# Patient Record
Sex: Female | Born: 1980 | Race: White | Hispanic: No | Smoking: Never smoker
Health system: Southern US, Community
[De-identification: ages and names within clinical notes are randomized; demographics above are authoritative.]

## PROBLEM LIST (undated history)

## (undated) ENCOUNTER — Inpatient Hospital Stay (HOSPITAL_COMMUNITY): Payer: Self-pay

## (undated) DIAGNOSIS — G479 Sleep disorder, unspecified: Secondary | ICD-10-CM

## (undated) DIAGNOSIS — J45909 Unspecified asthma, uncomplicated: Secondary | ICD-10-CM

## (undated) DIAGNOSIS — Q761 Klippel-Feil syndrome: Secondary | ICD-10-CM

## (undated) DIAGNOSIS — N920 Excessive and frequent menstruation with regular cycle: Secondary | ICD-10-CM

## (undated) DIAGNOSIS — D219 Benign neoplasm of connective and other soft tissue, unspecified: Secondary | ICD-10-CM

## (undated) DIAGNOSIS — I10 Essential (primary) hypertension: Secondary | ICD-10-CM

## (undated) DIAGNOSIS — F419 Anxiety disorder, unspecified: Secondary | ICD-10-CM

## (undated) DIAGNOSIS — R112 Nausea with vomiting, unspecified: Secondary | ICD-10-CM

## (undated) DIAGNOSIS — Z9889 Other specified postprocedural states: Secondary | ICD-10-CM

## (undated) DIAGNOSIS — Z3490 Encounter for supervision of normal pregnancy, unspecified, unspecified trimester: Secondary | ICD-10-CM

## (undated) DIAGNOSIS — O09529 Supervision of elderly multigravida, unspecified trimester: Secondary | ICD-10-CM

## (undated) DIAGNOSIS — E039 Hypothyroidism, unspecified: Secondary | ICD-10-CM

## (undated) DIAGNOSIS — Z87442 Personal history of urinary calculi: Secondary | ICD-10-CM

## (undated) HISTORY — DX: Essential (primary) hypertension: I10

## (undated) HISTORY — DX: Excessive and frequent menstruation with regular cycle: N92.0

## (undated) HISTORY — DX: Anxiety disorder, unspecified: F41.9

## (undated) HISTORY — DX: Supervision of elderly multigravida, unspecified trimester: O09.529

## (undated) HISTORY — DX: Encounter for supervision of normal pregnancy, unspecified, unspecified trimester: Z34.90

## (undated) HISTORY — DX: Hypothyroidism, unspecified: E03.9

## (undated) HISTORY — DX: Klippel-Feil syndrome: Q76.1

## (undated) HISTORY — DX: Sleep disorder, unspecified: G47.9

## (undated) HISTORY — DX: Benign neoplasm of connective and other soft tissue, unspecified: D21.9

## (undated) HISTORY — DX: Unspecified asthma, uncomplicated: J45.909

## (undated) HISTORY — PX: CHOLECYSTECTOMY: SHX55

## (undated) HISTORY — PX: LITHOTRIPSY: SUR834

## (undated) HISTORY — PX: DILATION AND CURETTAGE OF UTERUS: SHX78

---

## 2002-07-07 ENCOUNTER — Encounter: Payer: Self-pay | Admitting: Emergency Medicine

## 2002-07-07 ENCOUNTER — Emergency Department (HOSPITAL_COMMUNITY): Admission: EM | Admit: 2002-07-07 | Discharge: 2002-07-07 | Payer: Self-pay | Admitting: Emergency Medicine

## 2003-03-01 ENCOUNTER — Other Ambulatory Visit: Admission: RE | Admit: 2003-03-01 | Discharge: 2003-03-01 | Payer: Self-pay | Admitting: Obstetrics and Gynecology

## 2003-05-12 ENCOUNTER — Emergency Department (HOSPITAL_COMMUNITY): Admission: EM | Admit: 2003-05-12 | Discharge: 2003-05-12 | Payer: Self-pay | Admitting: Emergency Medicine

## 2003-07-26 ENCOUNTER — Emergency Department (HOSPITAL_COMMUNITY): Admission: EM | Admit: 2003-07-26 | Discharge: 2003-07-26 | Payer: Self-pay | Admitting: Emergency Medicine

## 2004-10-23 ENCOUNTER — Encounter: Admission: RE | Admit: 2004-10-23 | Discharge: 2004-10-23 | Payer: Self-pay | Admitting: Family Medicine

## 2005-07-25 ENCOUNTER — Emergency Department (HOSPITAL_COMMUNITY): Admission: EM | Admit: 2005-07-25 | Discharge: 2005-07-25 | Payer: Self-pay | Admitting: Emergency Medicine

## 2005-09-16 ENCOUNTER — Other Ambulatory Visit: Admission: RE | Admit: 2005-09-16 | Discharge: 2005-09-16 | Payer: Self-pay | Admitting: Obstetrics and Gynecology

## 2006-05-25 ENCOUNTER — Emergency Department (HOSPITAL_COMMUNITY): Admission: EM | Admit: 2006-05-25 | Discharge: 2006-05-26 | Payer: Self-pay | Admitting: Emergency Medicine

## 2006-05-27 ENCOUNTER — Ambulatory Visit (HOSPITAL_COMMUNITY): Admission: RE | Admit: 2006-05-27 | Discharge: 2006-05-27 | Payer: Self-pay | Admitting: Family Medicine

## 2006-10-10 ENCOUNTER — Ambulatory Visit (HOSPITAL_COMMUNITY): Admission: RE | Admit: 2006-10-10 | Discharge: 2006-10-10 | Payer: Self-pay | Admitting: Family Medicine

## 2006-10-14 ENCOUNTER — Encounter (HOSPITAL_COMMUNITY): Admission: RE | Admit: 2006-10-14 | Discharge: 2006-11-13 | Payer: Self-pay | Admitting: Family Medicine

## 2006-10-17 ENCOUNTER — Ambulatory Visit (HOSPITAL_COMMUNITY): Admission: RE | Admit: 2006-10-17 | Discharge: 2006-10-17 | Payer: Self-pay | Admitting: General Surgery

## 2006-10-17 ENCOUNTER — Encounter (INDEPENDENT_AMBULATORY_CARE_PROVIDER_SITE_OTHER): Payer: Self-pay | Admitting: Specialist

## 2007-10-12 ENCOUNTER — Ambulatory Visit (HOSPITAL_COMMUNITY): Admission: RE | Admit: 2007-10-12 | Discharge: 2007-10-12 | Payer: Self-pay | Admitting: Obstetrics and Gynecology

## 2007-11-13 ENCOUNTER — Ambulatory Visit (HOSPITAL_COMMUNITY): Admission: RE | Admit: 2007-11-13 | Discharge: 2007-11-13 | Payer: Self-pay | Admitting: Family Medicine

## 2007-12-30 ENCOUNTER — Other Ambulatory Visit: Admission: RE | Admit: 2007-12-30 | Discharge: 2007-12-30 | Payer: Self-pay | Admitting: Obstetrics and Gynecology

## 2007-12-31 ENCOUNTER — Ambulatory Visit (HOSPITAL_COMMUNITY): Admission: RE | Admit: 2007-12-31 | Discharge: 2007-12-31 | Payer: Self-pay | Admitting: Obstetrics and Gynecology

## 2007-12-31 ENCOUNTER — Encounter: Payer: Self-pay | Admitting: Obstetrics and Gynecology

## 2008-09-01 ENCOUNTER — Ambulatory Visit (HOSPITAL_COMMUNITY): Admission: RE | Admit: 2008-09-01 | Discharge: 2008-09-01 | Payer: Self-pay | Admitting: Family Medicine

## 2009-05-15 ENCOUNTER — Other Ambulatory Visit: Admission: RE | Admit: 2009-05-15 | Discharge: 2009-05-15 | Payer: Self-pay | Admitting: Obstetrics and Gynecology

## 2009-08-19 ENCOUNTER — Inpatient Hospital Stay (HOSPITAL_COMMUNITY): Admission: AD | Admit: 2009-08-19 | Discharge: 2009-08-19 | Payer: Self-pay | Admitting: Obstetrics and Gynecology

## 2009-08-19 ENCOUNTER — Ambulatory Visit: Payer: Self-pay | Admitting: Advanced Practice Midwife

## 2009-11-05 ENCOUNTER — Ambulatory Visit: Payer: Self-pay | Admitting: Obstetrics and Gynecology

## 2009-11-05 ENCOUNTER — Inpatient Hospital Stay (HOSPITAL_COMMUNITY): Admission: AD | Admit: 2009-11-05 | Discharge: 2009-11-05 | Payer: Self-pay | Admitting: Obstetrics & Gynecology

## 2009-11-25 ENCOUNTER — Inpatient Hospital Stay (HOSPITAL_COMMUNITY): Admission: AD | Admit: 2009-11-25 | Discharge: 2009-11-27 | Payer: Self-pay | Admitting: Obstetrics & Gynecology

## 2009-11-25 ENCOUNTER — Ambulatory Visit: Payer: Self-pay | Admitting: Advanced Practice Midwife

## 2010-05-14 ENCOUNTER — Ambulatory Visit (HOSPITAL_COMMUNITY): Admission: RE | Admit: 2010-05-14 | Discharge: 2010-05-14 | Payer: Self-pay | Admitting: General Surgery

## 2010-07-08 ENCOUNTER — Encounter: Payer: Self-pay | Admitting: Neurosurgery

## 2010-09-03 LAB — URINALYSIS, DIPSTICK ONLY
Bilirubin Urine: NEGATIVE
Glucose, UA: NEGATIVE mg/dL
Ketones, ur: 40 mg/dL — AB
Leukocytes, UA: NEGATIVE
Nitrite: NEGATIVE
Protein, ur: NEGATIVE mg/dL
Specific Gravity, Urine: 1.025 (ref 1.005–1.030)
Urobilinogen, UA: 0.2 mg/dL (ref 0.0–1.0)
pH: 6.5 (ref 5.0–8.0)

## 2010-09-03 LAB — COMPREHENSIVE METABOLIC PANEL
ALT: 14 U/L (ref 0–35)
AST: 22 U/L (ref 0–37)
Albumin: 2.9 g/dL — ABNORMAL LOW (ref 3.5–5.2)
Alkaline Phosphatase: 135 U/L — ABNORMAL HIGH (ref 39–117)
BUN: 5 mg/dL — ABNORMAL LOW (ref 6–23)
CO2: 18 mEq/L — ABNORMAL LOW (ref 19–32)
Calcium: 9 mg/dL (ref 8.4–10.5)
Chloride: 107 mEq/L (ref 96–112)
Creatinine, Ser: 0.63 mg/dL (ref 0.4–1.2)
GFR calc Af Amer: >60 mL/min (ref >60)
GFR calc non Af Amer: >60 mL/min (ref >60)
Glucose, Bld: 101 mg/dL — ABNORMAL HIGH (ref 70–99)
Potassium: 3 mEq/L — ABNORMAL LOW (ref 3.5–5.1)
Sodium: 137 mEq/L (ref 135–145)
Total Bilirubin: 0.3 mg/dL (ref 0.3–1.2)
Total Protein: 5.8 g/dL — ABNORMAL LOW (ref 6.0–8.3)

## 2010-09-03 LAB — CBC
HCT: 39.1 % (ref 36.0–46.0)
Hemoglobin: 13.6 g/dL (ref 12.0–15.0)
MCHC: 34.7 g/dL (ref 30.0–36.0)
MCV: 91.4 fL (ref 78.0–100.0)
Platelets: 269 10*3/uL (ref 150–400)
RBC: 4.28 MIL/uL (ref 3.87–5.11)
RDW: 13.3 % (ref 11.5–15.5)
WBC: 10.4 10*3/uL (ref 4.0–10.5)

## 2010-09-03 LAB — RPR: RPR Ser Ql: NONREACTIVE

## 2010-09-03 LAB — URIC ACID: Uric Acid, Serum: 4.8 mg/dL (ref 2.4–7.0)

## 2010-09-10 LAB — URINALYSIS, ROUTINE W REFLEX MICROSCOPIC
Bilirubin Urine: NEGATIVE
Glucose, UA: 500 mg/dL — AB
Hgb urine dipstick: NEGATIVE
Ketones, ur: NEGATIVE mg/dL
Nitrite: NEGATIVE
Protein, ur: NEGATIVE mg/dL
Specific Gravity, Urine: <1.005 — ABNORMAL LOW (ref 1.005–1.030)
Urobilinogen, UA: 0.2 mg/dL (ref 0.0–1.0)
pH: 6 (ref 5.0–8.0)

## 2010-09-10 LAB — WET PREP, GENITAL
Clue Cells Wet Prep HPF POC: NONE SEEN
Trich, Wet Prep: NONE SEEN
Yeast Wet Prep HPF POC: NONE SEEN

## 2010-09-10 LAB — GLUCOSE, CAPILLARY: Glucose-Capillary: 94 mg/dL (ref 70–99)

## 2010-10-30 NOTE — H&P (Signed)
NAME:  [name], [name] NO.:  [id number]   MEDICAL RECORD NO.:  [id number]          PATIENT TYPE:  AMB   LOCATION:  DAY                           FACILITY:  [facility]   PHYSICIAN:  Tilda Burrow, M.D. DATE OF BIRTH:  [birth year]   DATE OF ADMISSION:  DATE OF DISCHARGE:  LH                              HISTORY & PHYSICAL   ADMITTING DIAGNOSIS:  Missed abortion.   HISTORY OF PRESENT ILLNESS:  This is a 30 year old female gravida 1,  para 0, LMP in October 08, 2007, placing her about [redacted] weeks gestation was  admitted after ultrasound confirmation of a missed abortion.  She has  been seen in our office intermittently during the pregnancy.  She had an  ultrasound at 5 weeks 5 days on December 15, 2007, which suggested that she  was not as far long with her menstrual history would suggest, but the  fetal heart tone was noted at that point in time.  The patient was seen  back first in first trimester starting on December 24, 2007, and heart beats  remained present.  A week later, there is absence of fetal heart motion.  At the time of the visit on December 24, 2007, the growth was distinctly  abnormal with slow growth to date.  Today, absent fetal heart motion is  noted and there has been essentially no growth with the fetal pole since  December 15, 2007, today it measures only 4 mm in length and has no fetal  heart.  The yolk sac is beginning to collapse.  Additionally, the  placenta has separated from the wall over the gyrus area.  The bulb of  the placenta was located in the contralateral side of the sac from where  yolk sac exist.   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  She has a right paratubal cyst.  This was diagnosed by laparoscopy on  April 2009.   PAST SURGICAL HISTORY:  Diagnostic laparoscopy in April  2009, also  laparoscopy done by Dr. Henderson Cloud in October 2004 for pelvic pain, at  which time the abnormalities were similarly identified.   ADDITIONAL SURGICAL HISTORY:  Cholecystectomy.   ALLERGIES:  None.   MEDICATIONS:  Aldomet 250 p.o. daily for hypertension.   PHYSICAL EXAMINATION:  VITAL SIGNS:  The fatigued Caucasian female  height 5 feet, weight 106, and blood pressure 110/60.  HEENT:  Pupils equal, round, and reactive.  CARDIOVASCULAR:  Unremarkable.  ABDOMEN:  Nontender.  EXTERNAL GENITALIA:  Normal female uterus.  Cervix is normal with uterus  by ultrasound grossly unremarkable but previously done identifying as  having rudimentary right uterine horn.   ADDITIONAL LABS:  Include, blood type O positive.  Hemoglobin 13,  hematocrit 39, and white count 7100.  Hepatitis , HIV, RPR, and all  negative on December 15, 2007.   PLAN:  Suction D and C at 8 a.m. on December 31, 2007.      Jonny Ruiz  Benancio Deeds, M.D.  Electronically Signed     JVF/MEDQ  D:  12/30/2007  T:  12/31/2007  Job:  [id number]   cc:   [facility]

## 2010-10-30 NOTE — Op Note (Signed)
NAME:  [name], [name] NO.:  [id number]   MEDICAL RECORD NO.:  [id number]          PATIENT TYPE:  AMB   LOCATION:  DAY                           FACILITY:  [facility]   PHYSICIAN:  Tilda Burrow, M.D. DATE OF BIRTH:  15-Mar-1981   DATE OF PROCEDURE:  10/12/2007  DATE OF DISCHARGE:                               OPERATIVE REPORT   PREOPERATIVE DIAGNOSES:  1. Mullerian defect.  2. Pelvic pain, right-sided predominance.  3. Endometriosis.   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  2. No evidence of endometriosis.  3. Pelvic pain.   PROCEDURES:  1. Diagnostic laparoscopy.  2. Tubal dye studies.   FINDINGS:  Rudimentary uterine horn.  A well-developed right round  ligament.  No evidence of endometriosis.  Possible source to the  premenstrual pelvic discomfort due to the small right uterine horn  protruding off the mid portion of the lower uterine segment of the  dominant left uterine body.   DETAILS OF PROCEDURE:  The patient was taken to the operating room,  prepped and draped for a combined abdominal and vaginal procedure.  An  infraumbilical vertical 1-cm skin incision was made as well as a  transverse suprapubic 1-cm incision.  A uterine manipulator was placed  (HUMI) along with single-tooth tenaculum on the cervix for uterine  manipulation and injection.  A Foley catheter was then placed.  The  infraumbilical vertical 1-cm skin incision was completed.  A Veress  needle was used to introduce CO2 into the abdomen under 4 mmHg of  pressure with the abdomen easily filled with 2.5 L CO2.  A 5-mm  laparoscopic trocar was introduced under direct visualization through  the umbilical incision, and the pelvis inspected.  There was no evidence  of injury to the organs upon entry.  Findings from the pelvis were  notable as follows:  An essentially nearly normal-appearing left uterine  body, which made up the bulk of the uterine  size with a normal-appearing  left tube, normal-appearing left ovary, and the ureter clearly visible  through the retroperitoneal surfaces, resting fairly shortened  uterosacral ligament on the left side.  On the right side, there was a  normal-appearing ovary; a slightly shortened right fallopian tube; a  small 2-cm x 3-cm thumb-sized protrusion of myometrium from the mid  portion of the lower uterine segment of the dominant uterine body.  This  could be manipulated and found to have a large round ligament attached  to it and was felt to represent the right uterine body, relatively  small, less than one-fifth size of the dominant left-sided uterus.  On  the right side, the uterosacral ligament was more atretic, and the  ureter on the right was far away from the uterosacral ligament on the  right.  Additionally, the urachus on the left side was very firm and  well developed and by comparison on the right side was not clearly  visible.   DETAILS OF THE PROCEDURE:  The patient had visualization of the  structures as described above and inspection thoroughly showed no  evidence of endometriosis.  Further documentation was taken with  posterior photos and video documentation.  The tubal dye studies were  then conducted with the right tube never showing any tubal spillage.  By  comparison, the left tube promptly showed spillage of 3 mL of methylene  blue injection, and despite repeated injections using a total of 10 mL  of methylene blue solution, we never got any dye to enter the right tube  or come out of the tip of the right tube.  Methylene blue was suctioned  out of the pelvis and the procedure considered successfully completed.  No ablation of endometriosis was indicated.  The patient then had  deflation of the abdomen, subcutaneous Dexon closure of the incisions  and went to the recovery room in stable condition.  Sponge and needle  counts correct.      Tilda Burrow, M.D.   Electronically Signed     JVF/MEDQ  D:  10/12/2007  T:  10/13/2007  Job:  [id number]

## 2010-10-30 NOTE — H&P (Signed)
NAME:  [name], [name] NO.:  [id number]   MEDICAL RECORD NO.:  [id number]          PATIENT TYPE:  AMB   LOCATION:  DAY                           FACILITY:  [facility]   PHYSICIAN:  Tilda Burrow, M.D. DATE OF BIRTH:  08-16-1980   DATE OF ADMISSION:  DATE OF DISCHARGE:  LH                              HISTORY & PHYSICAL   ADMISSION DIAGNOSIS:  1. Dysmenorrhea.  2. Primary infertility.  3. 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  4. Endometriosis.   HISTORY OF PRESENT ILLNESS:  This 30 year old married Caucasian female  gravida 0, para 0, is admitted for diagnostic laparoscopy and tubal dye  studies.  [name] is now complaining of progressively debilitating  pelvic pain.  She is desiring pregnancy at this time and is currently  attempting conception.  Fertility workup is just being begun.  Interestingly, she had a diagnostic laparoscopy for increasing pelvic  pain April 01, 2003, at [facility] [place], [place]  where she was found to have endometriosis as well as suspicion for  endometrial polyps with mullerian defect suspected.  The uterus was  notable for small amounts of endometriosis.  That was treated  laparoscopically.  She had hysteroscopy which showed a shaggy  endometrium treated by endometrial curettage with subsequent thin clear  and clean appearing endometrial cavity.  She had no identifiable right  uterine horn or right tubal ostia.  The laparoscopic portion of the  procedure identified 5-10 mm implants of white-gray endometriosis on the  left pelvic sidewall with filmy adhesions in the area which were  treated.  The right ovary and distal fallopian tube appeared normal that  led to very rudimentary structure that was felt likely to be a second  uterine horn that was largely atretic.  The round ligament on the right  side was also very abbreviated.  There is no mention of endometriosis on  that side.   The patient is having a repeat laparoscopy  and tubal dye studies to  assess patency of left fallopian tube and  to confirm that there is  indeed no patency from the atretic right horn to the uterine cavity.  The laparoscopy has been performed instead of radiographic tubal dye  studies as the patient is finding the pain of her menses progressively  more debilitating and she wishes [place] to attempt to again ablate any  identifiable endometriosis with hope for similar improved results as  were received last time in 2004.  The patient is aware of the  limitations of the procedure including the possibility that no  improvement in her pain can be accomplished.  Similarly, there are  inherent risks the procedure including injury to adjacent organs,  bleeding, or infection, which have been specifically spelled out to the  patient.   PAST MEDICAL HISTORY:  Positive for  kidney stones requiring surgical  lithotripsy in 1999, endometriosis treated 2004 at [facility] and she  has had no other medical conditions.  She is a very high intensity  anxiety  prone  person.  She was managed on Micronor, continuous oral  contraceptives in 2004-2008 when she discontinued in order to  allow the  possibility of pregnancy.  There have been no noted pregnancies to date.  Female fertility evaluation has not been completed.   ALLERGIES:  She is allergic to SULFA and BIAXIN and PERCODAN.   MEDICATIONS:  None at present other than Vicodin for pain.   PHYSICAL EXAMINATION:  GENERAL:  Exam shows a small-framed Caucasian  female.  HEENT:  Pupils equal, round, reactive to light.  Extraocular movements  intact.  NECK:  Supple.  CHEST:  Clear to auscultation.  ABDOMEN:  Nontender, well healed previous surgical scars.  EXTERNAL GENITALIA:  Nulliparous.  External genitalia with normal  appearing cervix in the midline with ultrasound identification of the  uterus which appears to be within normal limits in sizebut has a curved  banana shaped  uterine cavity deviating  to the right.  The cavity  length was measured in the sagittal plane at 3.4 cm length by 0.7 cm AP  depth one day prior to beginning of her most recent menses (October 06, 2007).  EXTREMITIES:  Grossly normal without cyanosis, clubbing, or edema.   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   PLAN:  Diagnostic laparoscopy  and treatment of any identified  endometriosis.  Tubal dye studies to rule out communications to atretic,  right fallopian tube and right tubal ostia. If the right side of the  uterus is atretic and does not communicate, consideration to removal of  the right side's atretic uterus has been discussed. The right ovary will  be preserved.        Tilda Burrow, M.D.  Electronically Signed     JVF/MEDQ  D:  10/07/2007  T:  10/08/2007  Job:  [id number]

## 2010-10-30 NOTE — Op Note (Signed)
NAME:  [name], [name] NO.:  [id number]   MEDICAL RECORD NO.:  [id number]         PATIENT TYPE:  PAMB   LOCATION:  DAY                           FACILITY:  [facility]   PHYSICIAN:  Tilda Burrow, M.D. DATE OF BIRTH:  12-19-1980   DATE OF PROCEDURE:  12/31/2007  DATE OF DISCHARGE:                               OPERATIVE REPORT   PREOPERATIVE DIAGNOSES:  Missed abortion, uterine anomaly with dominant  left uterine horn.   POSTOPERATIVE DIAGNOSES:  Missed abortion, uterine anomaly with dominant  left uterine horn.   PROCEDURE:  Suction dilation and curettage.   SURGEON:  Tilda Burrow, M.D.   ASSISTANT:  None.   ANESTHESIA:  General with endotracheal intubation.   COMPLICATIONS:  None.   FINDINGS:  Midplane uterus deviated slightly posteriorly into the  patient's left as previously noted on prior records.  The uterus  sounding to 11 cm, pre and post procedure.   DETAILS OF PROCEDURE:  The patient was taken to the operating room,  prepped and draped in the usual fashion for vaginal work.  Time-out was  conducted, and the patient's procedure initiated.  Exam under anesthesia  was done to confirm orientation of the uterus with the uterus palpably  located midplane and deviated slightly to the left.  Uterus was then  sounded with single-tooth tenaculum used to grasp the cervix and placed  the uterus on traction.  The uterus was sounded to 11 cm, and then  easily dilated to 27-French allowing easy introduction of a curved 8-mm  suction curette into the uterine cavity.  Suction was attached under 45  mmHg pressure and circumferential curettage performed obtaining  appropriate amounts of tissue, fluid, and few old clots consistent with  missed AB.  A brief smooth and sharp curettage was performed confirming  that the uterine wall seemed smooth and regular.  At no time during the  procedure was there any suspicion of perforation even though the uterus  was a  little bit longer in length than anticipated.  The patient  tolerated the procedure well and went to recovery room in stable  condition with 9 mL of 1% Marcaine injected in the paracervical fascia  immediately prior to the completion of the procedure.  Sponge and needle  counts were correct.   ADDENDUM   Blood type confirmed as O+.      Tilda Burrow, M.D.  Electronically Signed     JVF/MEDQ  D:  12/31/2007  T:  12/31/2007  Job:  [id number]

## 2010-11-02 NOTE — H&P (Signed)
NAME:  [name], [name] NO.:  [id number]   MEDICAL RECORD NO.:  [id number]          PATIENT TYPE:  AMB   LOCATION:  DAY                           FACILITY:  [facility]   PHYSICIAN:  Dalia Heading, M.D.  DATE OF BIRTH:  1980-10-27   DATE OF ADMISSION:  DATE OF DISCHARGE:  LH                              HISTORY & PHYSICAL   CHIEF COMPLAINT:  Chronic cholecystitis.   HISTORY OF PRESENT ILLNESS:  Patient is a 30 year old white female who  was referred for evaluation and treatment of biliary colic secondary to  chronic cholecystitis.  She has been having right upper quadrant  abdominal pain, nausea, and bloating for many weeks.  She does have  fatty food intolerance.  No fever, chills, or jaundice had been noted.   PAST MEDICAL HISTORY:  Past medical history is unremarkable.   PAST SURGICAL HISTORY:  Lithotripsy.   CURRENT MEDICATIONS:  Birth control pills, Benicar.   ALLERGIES:  BIAXIN, PERCODAN, PERCOCET, RED DYE.   REVIEW OF SYMPTOMS:  Noncontributory.   PHYSICAL EXAMINATION:  GENERAL:  On physical examination, patient is a  well-developed, well-nourished, white female in no acute distress.  HEENT:  Examination reveals no scleral icterus.  LUNGS:  Clear to auscultation with equal breath sounds bilaterally.  HEART:  Examination reveals regular rate and rhythm without S3, S4, or  murmurs.  ABDOMEN:  The abdomen is soft and nondistended.  She is tender in the  right upper quadrant to palpation.  No hepatosplenomegaly, masses,  hernias are identified.  Ultrasound of the gallbladder is negative.  Hepatobiliary scan reveals chronic cholecystitis with a low gallbladder  ejection fraction.   IMPRESSION:  Chronic cholecystitis.   PLAN:  The patient is scheduled for laparoscopic cholecystectomy on  10/17/2006.  The risks and benefits of the procedure including bleeding,  infection, hepatobiliary injury, the possibility of an open procedure  were fully explained to the  patient, and gave informed consent.  A short  stay an [facility].      Dalia Heading, M.D.  Electronically Signed     MAJ/MEDQ  D:  10/16/2006  T:  10/16/2006  Job:  [id number]   cc:   Patrica Duel, M.D.  Fax: [phone]

## 2010-11-02 NOTE — Op Note (Signed)
NAME:  [name], [name] NO.:  [id number]   MEDICAL RECORD NO.:  [id number]          PATIENT TYPE:  AMB   LOCATION:  DAY                           FACILITY:  [facility]   PHYSICIAN:  Dalia Heading, M.D.  DATE OF BIRTH:  [birth year]   DATE OF PROCEDURE:  10/17/2006  DATE OF DISCHARGE:                               OPERATIVE REPORT   PREOPERATIVE DIAGNOSIS:  Chronic cholecystitis.   POSTOPERATIVE DIAGNOSIS:  Chronic cholecystitis.   PROCEDURE:  Laparoscopic cholecystectomy.   SURGEON:  Dalia Heading, M.D.   ANESTHESIA:  General endotracheal.   INDICATIONS:  The patient is a 30 year old white female who presents  with biliary colic secondary to chronic cholecystitis.  The patient now  presents for laparoscopic cholecystectomy.  The risks and benefits of  the procedure, including bleeding, infection, hepatobiliary injury, and  the possibility of an open procedure, were fully explained to the  patient who gave informed consent.   PROCEDURE NOTE:  The patient was placed in the supine position.  After  induction of general endotracheal anesthesia, the abdomen was prepped  and draped using the usual sterile technique with Betadine.  Surgical  site confirmation was performed.   An infraumbilical incision was made down to the fascia.  A Veress needle  was introduced into the abdominal cavity, and confirmation of placement  was done using the saline drop test.  The abdomen was then insufflated  to 16 mmHg pressure.  An 11-mm trocar was introduced into the abdominal  cavity under direct visualization without difficulty.  The patient was  placed in reversed Trendelenburg position, and an additional 11-mm  trocar was placed in the epigastric region and 5-mm trocars were placed  in the right upper quadrant and right flank regions.  The liver was  inspected and noted to be within normal limits.  The gallbladder was  retracted superiorly and laterally.  The dissection was  begun around the  infundibulum of the gallbladder.  The cystic duct was first identified.  Its juncture to the infundibulum was fully identified.  Endoclips were  placed proximally and distally on the cystic duct, and the cystic duct  was divided.  This was likewise done on the cystic artery.  The  gallbladder was then freed away from the gallbladder fossa using Bovie  electrocautery.  The gallbladder was delivered through the epigastric  trocar site using an EndoCatch bag.  The gallbladder fossa was  inspected.  No abnormal bleeding or bile leakage was noted.  Surgicel  was placed in the gallbladder fossa.  All fluid and air were then  evacuated from the abdominal cavity prior to removal of the trocars.   All wounds were irrigated with normal saline.  All wounds were injected  with 0.5% Sensorcaine.  The infraumbilical fascia was reapproximated  using 0 Vicryl interrupted sutures.  All skin incisions were closed  using staples.  Betadine ointment and dry sterile dressings were  applied.   All tape and needle counts were correct at the end of the procedure.  The patient was extubated in the operating room and went back to the  recovery  room awake and in stable condition.   COMPLICATIONS:  None.   SPECIMEN:  Gallbladder.   ESTIMATED BLOOD LOSS:  Minimal.      Dalia Heading, M.D.  Electronically Signed     MAJ/MEDQ  D:  10/17/2006  T:  10/17/2006  Job:  [id number]   cc:   Patrica Duel, M.D.  Fax: [phone]

## 2011-03-12 LAB — COMPREHENSIVE METABOLIC PANEL
ALT: 22
AST: 21
Albumin: 4
Alkaline Phosphatase: 39
BUN: 6
CO2: 27
Calcium: 8.9
Chloride: 101
Creatinine, Ser: 0.62
GFR calc Af Amer: >60
GFR calc non Af Amer: >60
Glucose, Bld: 79
Potassium: 3.9
Sodium: 134 — ABNORMAL LOW
Total Bilirubin: 0.4
Total Protein: 6.3

## 2011-03-12 LAB — HCG, QUANTITATIVE, PREGNANCY: hCG, Beta Chain, Quant, S: <2

## 2011-03-12 LAB — CBC
HCT: 34.1 — ABNORMAL LOW
Hemoglobin: 11.9 — ABNORMAL LOW
MCHC: 34.9
MCV: 92.1
Platelets: 332
RBC: 3.7 — ABNORMAL LOW
RDW: 13.3
WBC: 6.8

## 2011-03-15 LAB — BASIC METABOLIC PANEL
BUN: 4 — ABNORMAL LOW
CO2: 25
Calcium: 9
Chloride: 107
Creatinine, Ser: 0.65
GFR calc Af Amer: >60
GFR calc non Af Amer: >60
Glucose, Bld: 96
Potassium: 3.8
Sodium: 136

## 2011-03-15 LAB — CBC
HCT: 35.8 — ABNORMAL LOW
Hemoglobin: 12.4
MCHC: 34.5
MCV: 91.6
Platelets: 300
RBC: 3.91
RDW: 12.6
WBC: 7

## 2011-05-16 ENCOUNTER — Other Ambulatory Visit: Payer: Self-pay | Admitting: Family Medicine

## 2011-05-16 DIAGNOSIS — R200 Anesthesia of skin: Secondary | ICD-10-CM

## 2011-06-13 ENCOUNTER — Other Ambulatory Visit (HOSPITAL_COMMUNITY): Payer: Self-pay

## 2011-06-20 ENCOUNTER — Other Ambulatory Visit (HOSPITAL_COMMUNITY): Payer: Self-pay

## 2011-06-27 ENCOUNTER — Ambulatory Visit (HOSPITAL_COMMUNITY)
Admission: RE | Admit: 2011-06-27 | Discharge: 2011-06-27 | Disposition: A | Discharge status: Home / Self Care | Payer: BC Managed Care – PPO | Source: Ambulatory Visit | Attending: Family Medicine | Admitting: Family Medicine

## 2011-06-27 DIAGNOSIS — R200 Anesthesia of skin: Secondary | ICD-10-CM

## 2011-06-27 DIAGNOSIS — M5020 Other cervical disc displacement, unspecified cervical region: Secondary | ICD-10-CM | POA: Insufficient documentation

## 2011-06-27 DIAGNOSIS — M542 Cervicalgia: Secondary | ICD-10-CM | POA: Insufficient documentation

## 2011-06-27 DIAGNOSIS — R209 Unspecified disturbances of skin sensation: Secondary | ICD-10-CM | POA: Insufficient documentation

## 2011-06-27 DIAGNOSIS — M25519 Pain in unspecified shoulder: Secondary | ICD-10-CM | POA: Insufficient documentation

## 2012-09-04 ENCOUNTER — Telehealth: Payer: Self-pay | Admitting: Family Medicine

## 2012-09-04 ENCOUNTER — Other Ambulatory Visit: Payer: Self-pay | Admitting: *Deleted

## 2012-09-04 DIAGNOSIS — R05 Cough: Secondary | ICD-10-CM

## 2012-09-04 MED ORDER — BENZONATATE 100 MG PO CAPS: 100.0000 mg | ORAL_CAPSULE | Freq: Three times a day (TID) | ORAL | Status: DC | PRN

## 2012-09-04 NOTE — Telephone Encounter (Signed)
Pt called to notify of rx telephoned to [facility]

## 2012-09-04 NOTE — Telephone Encounter (Signed)
Patients husband came in last week with coughing and congestion and now patient is having a horrible cough and would like an Rx for Tesslon Pearls called in  [facility]

## 2012-09-04 NOTE — Telephone Encounter (Signed)
Tessalon 100mg  #30 one tid prn, if fever or worse f/u

## 2012-09-07 ENCOUNTER — Ambulatory Visit (INDEPENDENT_AMBULATORY_CARE_PROVIDER_SITE_OTHER): Payer: BC Managed Care – PPO | Admitting: Nurse Practitioner

## 2012-09-07 ENCOUNTER — Encounter: Payer: Self-pay | Admitting: Family Medicine

## 2012-09-07 ENCOUNTER — Encounter: Payer: Self-pay | Admitting: Nurse Practitioner

## 2012-09-07 VITALS — BP 130/98 | Temp 99.7°F | Wt 105.8 lb

## 2012-09-07 DIAGNOSIS — I10 Essential (primary) hypertension: Secondary | ICD-10-CM | POA: Insufficient documentation

## 2012-09-07 DIAGNOSIS — J45909 Unspecified asthma, uncomplicated: Secondary | ICD-10-CM | POA: Insufficient documentation

## 2012-09-07 DIAGNOSIS — J45901 Unspecified asthma with (acute) exacerbation: Secondary | ICD-10-CM

## 2012-09-07 DIAGNOSIS — J069 Acute upper respiratory infection, unspecified: Secondary | ICD-10-CM | POA: Insufficient documentation

## 2012-09-07 MED ORDER — HYDROXYZINE HCL 25 MG PO TABS: 25.0000 mg | ORAL_TABLET | Freq: Four times a day (QID) | ORAL | Status: DC | PRN

## 2012-09-07 MED ORDER — AMOXICILLIN-POT CLAVULANATE 875-125 MG PO TABS: 1.0000 | ORAL_TABLET | Freq: Two times a day (BID) | ORAL | Status: AC

## 2012-09-07 MED ORDER — PREDNISONE 20 MG PO TABS: ORAL_TABLET | ORAL | Status: DC

## 2012-09-07 NOTE — Assessment & Plan Note (Signed)
Assessment: Asthma with acute exacerbation Plan: Prednisone 20 mg nine-day taper. Continue albuterol as directed.

## 2012-09-07 NOTE — Progress Notes (Signed)
Subjective: Presents for complaints of cough and congestion for the past 4 days. Slight fever. Green nasal drainage. Frequent cough. Occasional posttussive vomiting. Right-sided chest pain with deep breath or cough. Flareup of her asthma, using her albuterol 2-3 times per day. Right ear pain. No headache or sore throat. No diarrhea or abdominal pain. Taking fluids well. Voiding normal limit. Objective: NAD. Alert, oriented. TMs clear effusion, no erythema. Posterior pharynx erythematous with green PND noted. Neck supple with mild soft nontender adenopathy. Lungs rare faint expiratory wheeze, no tachypnea. Normal color. Heart regular rate rhythm.

## 2012-09-07 NOTE — Assessment & Plan Note (Signed)
Assessment: URI Plan: Augmentin 875 mg 1 by mouth day x10 days (20 no refills). Atarax 25 mg 1 by mouth every 6 hours when necessary. Call back by the end of the week if no improvement, sooner if worse.

## 2012-11-30 ENCOUNTER — Other Ambulatory Visit: Payer: Self-pay | Admitting: Obstetrics and Gynecology

## 2012-11-30 DIAGNOSIS — Z309 Encounter for contraceptive management, unspecified: Secondary | ICD-10-CM

## 2013-02-02 ENCOUNTER — Other Ambulatory Visit: Payer: Self-pay | Admitting: Family Medicine

## 2013-02-02 NOTE — Telephone Encounter (Signed)
Nurses please inform the patient current literature states that 5 mg of amlodipine often can do the trick in females whereas 10 mg can run the risk of daytime sleepiness or amnesia in the early morning hours. See if patient willing to try 5 mg. If 5 mg does track in may well be safer for her. If 5 mg does not do the trick we can easily go back to 10 mg

## 2013-02-03 ENCOUNTER — Telehealth: Payer: Self-pay | Admitting: Family Medicine

## 2013-02-03 NOTE — Telephone Encounter (Signed)
RX called in .

## 2013-02-03 NOTE — Telephone Encounter (Signed)
Left message on voicemail to return call.

## 2013-02-03 NOTE — Telephone Encounter (Signed)
Patient states that she is comfortable staying on the 10 mg. She has not had any negative side effects from this med.

## 2013-02-03 NOTE — Telephone Encounter (Signed)
Discussed with patient. This is documented under RX request for [employer].

## 2013-02-03 NOTE — Telephone Encounter (Signed)
Refill times 4 

## 2013-02-03 NOTE — Telephone Encounter (Signed)
Pt calling to see if we would prescribe her or re-prescribe her some Ambien to help her sleep. [facility]

## 2013-02-03 NOTE — Telephone Encounter (Signed)
Left message to return call on voicemail. (see RX response)

## 2013-06-14 ENCOUNTER — Telehealth: Payer: Self-pay | Admitting: Family Medicine

## 2013-06-14 NOTE — Telephone Encounter (Signed)
Consult with Carolyn-Needs office visit tomm for evaluation

## 2013-06-14 NOTE — Telephone Encounter (Signed)
Office visit schedule tomm for evaluation.

## 2013-06-14 NOTE — Telephone Encounter (Signed)
Patients daughter was just seen in the office today and diagnosed with strep. Patient now has fever,sore throat, achey all over and mom is hoping since Toula Moos was seen today if we can call her something in.   [facility]

## 2013-07-22 ENCOUNTER — Encounter: Payer: Self-pay | Admitting: Family Medicine

## 2013-07-22 ENCOUNTER — Ambulatory Visit (INDEPENDENT_AMBULATORY_CARE_PROVIDER_SITE_OTHER): Payer: BC Managed Care – PPO | Admitting: Family Medicine

## 2013-07-22 VITALS — BP 122/80 | Temp 99.0°F | Ht 59.0 in | Wt 119.8 lb

## 2013-07-22 DIAGNOSIS — R509 Fever, unspecified: Secondary | ICD-10-CM

## 2013-07-22 DIAGNOSIS — J029 Acute pharyngitis, unspecified: Secondary | ICD-10-CM

## 2013-07-22 LAB — POCT RAPID STREP A (OFFICE): Rapid Strep A Screen: NEGATIVE

## 2013-07-22 NOTE — Progress Notes (Signed)
   Subjective:    Patient ID: [name], female    DOB: [birth year], 33 y.o.   MRN: [id number]  Sore Throat  This is a new problem. Associated symptoms comments: Fatigue, fever.   low energy over the past couple days increased amount of sleep some low-grade fever as well.  PMH benign Review of Systems Patient relates slight sore throat no high fevers low-grade fever she does relate fatigue and tiredness no cough no vomiting diarrhea some nausea    Objective:   Physical Exam Eardrums are normal throat is normal neck is supple lungs are clear heart is regular skin warm dry low-grade fever noted  Rapid strep negative       Assessment & Plan:  Febrile illness-Tylenol when necessary Pharyngitis strep test negative. Await back up. Patient was told if sore throat coughing headache other symptoms began could be the flu call us back. May need Tamiflu. Patient thinks she may be one week pregnant. She follows with local gynecologist

## 2013-07-23 LAB — STREP A DNA PROBE: GASP: NEGATIVE

## 2013-11-03 ENCOUNTER — Telehealth: Payer: Self-pay | Admitting: Family Medicine

## 2013-11-03 MED ORDER — ETODOLAC 400 MG PO TABS: 400.0000 mg | ORAL_TABLET | Freq: Two times a day (BID) | ORAL | Status: DC | PRN

## 2013-11-03 NOTE — Telephone Encounter (Signed)
Patient is having a lot of pain in her neck from the stress from her job and would like to know what she can do to help? She said Dr. Nicki Reaper told her last time to call in. She has tried heating packs.

## 2013-11-03 NOTE — Telephone Encounter (Signed)
Lodine 400 one bid with food prn, ov with dr Nicki Reaper if persist, numb 28

## 2013-11-03 NOTE — Telephone Encounter (Signed)
Medication sent to pharmacy. Patient was notified.  

## 2013-11-16 ENCOUNTER — Telehealth: Payer: Self-pay | Admitting: Family Medicine

## 2013-11-16 MED ORDER — BENZONATATE 200 MG PO CAPS: 200.0000 mg | ORAL_CAPSULE | Freq: Three times a day (TID) | ORAL | Status: DC | PRN

## 2013-11-16 NOTE — Addendum Note (Signed)
Addended by: Jesusita Oka on: 11/16/2013 03:57 PM   Modules accepted: Orders

## 2013-11-16 NOTE — Telephone Encounter (Signed)
Nurse to call, currently for cough over-the-counter measures include Delsym or Robitussin-DM. Prescription cough medication Tessalon 200 mg 3 times a day when necessary cough can be used if interested #30 with 1 refill. If ongoing troubles certainly evaluation recommended

## 2013-11-16 NOTE — Telephone Encounter (Signed)
Notified patient currently for cough over-the-counter measures include Delsym or Robitussin-DM. Prescription cough medication was sent to pharmacy. Patient verbalized understanding.

## 2013-11-16 NOTE — Telephone Encounter (Signed)
Patient has a bad cough and needs something called into [facility].

## 2013-11-17 ENCOUNTER — Telehealth: Payer: Self-pay | Admitting: Family Medicine

## 2013-11-17 NOTE — Telephone Encounter (Signed)
This was done yesterday. (See message 11/16/13)

## 2013-11-17 NOTE — Telephone Encounter (Signed)
Error and was redone and sent.

## 2013-11-17 NOTE — Telephone Encounter (Signed)
Patient has a bad cough that she cant get rid of and wants something  Called into [facility].

## 2013-11-18 ENCOUNTER — Ambulatory Visit (INDEPENDENT_AMBULATORY_CARE_PROVIDER_SITE_OTHER): Payer: BC Managed Care – PPO | Admitting: Family Medicine

## 2013-11-18 ENCOUNTER — Encounter: Payer: Self-pay | Admitting: Family Medicine

## 2013-11-18 VITALS — BP 112/78 | Temp 98.5°F | Ht 59.0 in | Wt 120.0 lb

## 2013-11-18 DIAGNOSIS — J069 Acute upper respiratory infection, unspecified: Secondary | ICD-10-CM

## 2013-11-18 DIAGNOSIS — J019 Acute sinusitis, unspecified: Secondary | ICD-10-CM

## 2013-11-18 DIAGNOSIS — J209 Acute bronchitis, unspecified: Secondary | ICD-10-CM

## 2013-11-18 MED ORDER — ALPRAZOLAM 0.25 MG PO TABS: 0.2500 mg | ORAL_TABLET | Freq: Every day | ORAL | Status: DC

## 2013-11-18 MED ORDER — CEFDINIR 300 MG PO CAPS: 300.0000 mg | ORAL_CAPSULE | Freq: Two times a day (BID) | ORAL | Status: AC

## 2013-11-18 MED ORDER — PREDNISONE 20 MG PO TABS: ORAL_TABLET | ORAL | Status: AC

## 2013-11-18 NOTE — Progress Notes (Signed)
   Subjective:    Patient ID: [name], female    DOB: 1981/03/09, 33 y.o.   MRN: [id number]  Cough This is a new problem. Episode onset: 4 days ago. The problem has been gradually worsening. The problem occurs constantly. The cough is non-productive. Associated symptoms include chest pain, headaches and rhinorrhea. Pertinent negatives include no ear pain, fever, shortness of breath or wheezing. Associated symptoms comments: Scratchy throat. The symptoms are aggravated by lying down. She has tried prescription cough suppressant, OTC cough suppressant, cool air and rest for the symptoms. The treatment provided no relief.      Review of Systems  Constitutional: Negative for fever and activity change.  HENT: Positive for congestion and rhinorrhea. Negative for ear pain.   Eyes: Negative for discharge.  Respiratory: Positive for cough. Negative for shortness of breath and wheezing.   Cardiovascular: Positive for chest pain.  Neurological: Positive for headaches.       Objective:   Physical Exam  Nursing note and vitals reviewed. Constitutional: She appears well-developed.  HENT:  Head: Normocephalic.  Nose: Nose normal.  Mouth/Throat: Oropharynx is clear and moist. No oropharyngeal exudate.  Neck: Neck supple.  Cardiovascular: Normal rate and normal heart sounds.   No murmur heard. Pulmonary/Chest: Effort normal and breath sounds normal. She has no wheezes.  Lymphadenopathy:    She has no cervical adenopathy.  Skin: Skin is warm and dry.          Assessment & Plan:  Upper respiratory illness also secondary bronchitis-antibiotics prescribed Tessalon for cough cautioned drowsiness no need for lab work or x-rays currently

## 2014-01-10 ENCOUNTER — Other Ambulatory Visit: Payer: Self-pay | Admitting: Family Medicine

## 2014-01-11 ENCOUNTER — Other Ambulatory Visit: Payer: Self-pay | Admitting: Family Medicine

## 2014-01-11 NOTE — Telephone Encounter (Signed)
Seen 11/18/13 for sick visit

## 2014-01-11 NOTE — Telephone Encounter (Signed)
Ok 6 mo woth

## 2014-01-12 NOTE — Telephone Encounter (Signed)
Ok 6 mo worth 

## 2014-03-01 ENCOUNTER — Other Ambulatory Visit: Payer: BC Managed Care – PPO | Admitting: Obstetrics and Gynecology

## 2014-03-01 ENCOUNTER — Other Ambulatory Visit: Payer: Self-pay | Admitting: Family Medicine

## 2014-03-01 NOTE — Telephone Encounter (Signed)
Last seen 11/18/13 (sick)

## 2014-03-01 NOTE — Telephone Encounter (Signed)
May give this refill +2 additional

## 2014-03-03 ENCOUNTER — Other Ambulatory Visit: Payer: Self-pay | Admitting: Family Medicine

## 2014-03-03 NOTE — Telephone Encounter (Signed)
May refill this +2 additional refills needs followup by later this year for this prescription

## 2014-03-03 NOTE — Telephone Encounter (Signed)
Last seen 11/18/13

## 2014-03-14 ENCOUNTER — Encounter: Payer: Self-pay | Admitting: Family Medicine

## 2014-03-14 ENCOUNTER — Ambulatory Visit (INDEPENDENT_AMBULATORY_CARE_PROVIDER_SITE_OTHER): Payer: BC Managed Care – PPO | Admitting: Family Medicine

## 2014-03-14 VITALS — BP 116/80 | Ht 59.0 in | Wt 123.0 lb

## 2014-03-14 DIAGNOSIS — J4521 Mild intermittent asthma with (acute) exacerbation: Secondary | ICD-10-CM

## 2014-03-14 DIAGNOSIS — J45901 Unspecified asthma with (acute) exacerbation: Secondary | ICD-10-CM

## 2014-03-14 MED ORDER — HYDROCODONE-HOMATROPINE 5-1.5 MG/5ML PO SYRP: 5.0000 mL | ORAL_SOLUTION | Freq: Three times a day (TID) | ORAL | Status: DC | PRN

## 2014-03-14 MED ORDER — ALBUTEROL SULFATE HFA 108 (90 BASE) MCG/ACT IN AERS: 2.0000 | INHALATION_SPRAY | Freq: Four times a day (QID) | RESPIRATORY_TRACT | Status: DC | PRN

## 2014-03-14 MED ORDER — PREDNISONE 20 MG PO TABS: ORAL_TABLET | ORAL | Status: DC

## 2014-03-14 NOTE — Progress Notes (Signed)
   Subjective:    Patient ID: [name], female    DOB: [birth year], 33 y.o.   MRN: [id number]  Cough This is a new problem. The problem has been gradually worsening. The cough is productive of purulent sputum. Associated symptoms include shortness of breath and wheezing. Associated symptoms comments: Symptoms also include vomiting, diarrhea & body aches, chest discomfort.. The symptoms are aggravated by lying down. Treatments tried: Robitussin & Nasal Inhaler  usesd.  Shortness of Breath Associated symptoms include wheezing.    Patient denies fever chills or mucoid drainage. She relates she smoked in college but has not smoked for had had problems with asthma in the past has not had to use her inhaler for the past several months  Review of Systems  Respiratory: Positive for cough, shortness of breath and wheezing.        Objective:   Physical Exam Eardrums are normal throat is normal neck is supple lungs had tight cough no wheezes are heard no respiratory distress      Assessment & Plan:  Intermittent asthma flareup prednisone albuterol need for steroid inhaler Hycodan for cough patient states it helps her with cough and she can tolerate she was educated regarding warning signs to watch for progressive symptoms she will followup

## 2014-03-24 ENCOUNTER — Telehealth: Payer: Self-pay | Admitting: Family Medicine

## 2014-03-24 MED ORDER — AMOXICILLIN-POT CLAVULANATE 875-125 MG PO TABS: 1.0000 | ORAL_TABLET | Freq: Two times a day (BID) | ORAL | Status: AC

## 2014-03-24 NOTE — Telephone Encounter (Signed)
Patient having sinus pressure and ear pain and would like an antibiotic called in

## 2014-03-24 NOTE — Telephone Encounter (Signed)
Rx sent electronically to pharmacy. Patient notified. 

## 2014-03-24 NOTE — Telephone Encounter (Signed)
Pt seen 9/28 and issued cough med, prednisone pak,   She felt better at first but is now having a fever, ears hurt, sinus pressure  Does she need something else?   [facility]

## 2014-03-24 NOTE — Telephone Encounter (Signed)
NTC- call pt,review sx with her, (does she want to be seen or since we just saw her does she want antibiotic)

## 2014-03-24 NOTE — Telephone Encounter (Signed)
augmentin 875 one bid 10 days, with food, f/u if ongoing

## 2014-04-07 ENCOUNTER — Ambulatory Visit (INDEPENDENT_AMBULATORY_CARE_PROVIDER_SITE_OTHER): Payer: BC Managed Care – PPO | Admitting: Family Medicine

## 2014-04-07 ENCOUNTER — Encounter: Payer: Self-pay | Admitting: Family Medicine

## 2014-04-07 VITALS — BP 124/84 | Temp 98.7°F | Ht 59.0 in | Wt 127.0 lb

## 2014-04-07 DIAGNOSIS — B349 Viral infection, unspecified: Secondary | ICD-10-CM

## 2014-04-07 DIAGNOSIS — J0100 Acute maxillary sinusitis, unspecified: Secondary | ICD-10-CM

## 2014-04-07 MED ORDER — HYDROCODONE-HOMATROPINE 5-1.5 MG/5ML PO SYRP: 5.0000 mL | ORAL_SOLUTION | Freq: Three times a day (TID) | ORAL | Status: DC | PRN

## 2014-04-07 MED ORDER — AMOXICILLIN-POT CLAVULANATE 875-125 MG PO TABS: 1.0000 | ORAL_TABLET | Freq: Two times a day (BID) | ORAL | Status: DC

## 2014-04-07 NOTE — Progress Notes (Signed)
   Subjective:    Patient ID: [name], female    DOB: [birth year], 33 y.o.   MRN: [id number]  Fever  This is a new problem. Episode onset: Monday. The problem occurs daily. The problem has been gradually worsening. Associated symptoms include congestion, coughing, headaches, muscle aches and vomiting. She has tried acetaminophen for the symptoms. The treatment provided mild relief.   PMH benign   Review of Systems  Constitutional: Positive for fever.  HENT: Positive for congestion.   Respiratory: Positive for cough.   Gastrointestinal: Positive for vomiting.  Neurological: Positive for headaches.       Objective:   Physical Exam  Nursing note and vitals reviewed. Constitutional: She appears well-developed.  HENT:  Head: Normocephalic.  Nose: Nose normal.  Mouth/Throat: Oropharynx is clear and moist. No oropharyngeal exudate.  Neck: Neck supple.  Cardiovascular: Normal rate and normal heart sounds.   No murmur heard. Pulmonary/Chest: Effort normal and breath sounds normal. She has no wheezes.  Lymphadenopathy:    She has no cervical adenopathy.  Skin: Skin is warm and dry.          Assessment & Plan:  Febrile illness/viral syndrome/secondary sinusitis started off about a week and a half ago got a little bit better then got gradually worse

## 2014-05-03 ENCOUNTER — Ambulatory Visit (INDEPENDENT_AMBULATORY_CARE_PROVIDER_SITE_OTHER): Payer: BC Managed Care – PPO | Admitting: Family Medicine

## 2014-05-03 ENCOUNTER — Encounter: Payer: Self-pay | Admitting: Family Medicine

## 2014-05-03 VITALS — BP 122/80 | Temp 98.3°F | Wt 124.0 lb

## 2014-05-03 DIAGNOSIS — J310 Chronic rhinitis: Secondary | ICD-10-CM

## 2014-05-03 DIAGNOSIS — J329 Chronic sinusitis, unspecified: Secondary | ICD-10-CM

## 2014-05-03 DIAGNOSIS — J029 Acute pharyngitis, unspecified: Secondary | ICD-10-CM

## 2014-05-03 LAB — POCT RAPID STREP A (OFFICE): Rapid Strep A Screen: NEGATIVE

## 2014-05-03 MED ORDER — AMOXICILLIN-POT CLAVULANATE 875-125 MG PO TABS: 1.0000 | ORAL_TABLET | Freq: Two times a day (BID) | ORAL | Status: AC

## 2014-05-03 NOTE — Progress Notes (Signed)
   Subjective:    Patient ID: [name], female    DOB: 02/09/1981, 33 y.o.   MRN: [id number]  Sore Throat  This is a new problem. The current episode started yesterday. Associated symptoms comments: fever. She has had exposure to strep.    Felt bad yest  Took tyl and slept  Neck achey and throat hurt  Pos streo in the family  tmax 99.7  lolow gr achey and sore and not feeling well   Tylenol takes one usually do ea the trick .   history of sinus infection just a month ago. Review of Systems    no vomiting no diarrhea no rash no abdominal pain no chest pain ROS otherwise negative  Objective:   Physical Exam   Alert active good hydration. HEENT moderate nasal congestion. Pharynx normal neck supple lungs clear. Heart regular in rhythm. Frontal tenderness. Bilateral     Assessment & Plan:  Impression acute rhinosinusitis plan symptomatic care discussed. Antibiotics prescribed. Warning signs discussed. WSL

## 2014-05-31 ENCOUNTER — Encounter: Payer: Self-pay | Admitting: Adult Health

## 2014-05-31 ENCOUNTER — Ambulatory Visit (INDEPENDENT_AMBULATORY_CARE_PROVIDER_SITE_OTHER): Payer: BC Managed Care – PPO | Admitting: Adult Health

## 2014-05-31 VITALS — BP 122/78 | Ht 59.0 in | Wt 119.0 lb

## 2014-05-31 DIAGNOSIS — N920 Excessive and frequent menstruation with regular cycle: Secondary | ICD-10-CM

## 2014-05-31 DIAGNOSIS — O09521 Supervision of elderly multigravida, first trimester: Secondary | ICD-10-CM

## 2014-05-31 DIAGNOSIS — Z3490 Encounter for supervision of normal pregnancy, unspecified, unspecified trimester: Secondary | ICD-10-CM

## 2014-05-31 DIAGNOSIS — O09529 Supervision of elderly multigravida, unspecified trimester: Secondary | ICD-10-CM

## 2014-05-31 DIAGNOSIS — Z3200 Encounter for pregnancy test, result unknown: Secondary | ICD-10-CM

## 2014-05-31 DIAGNOSIS — Z3202 Encounter for pregnancy test, result negative: Secondary | ICD-10-CM

## 2014-05-31 HISTORY — DX: Encounter for supervision of normal pregnancy, unspecified, unspecified trimester: Z34.90

## 2014-05-31 HISTORY — DX: Supervision of elderly multigravida, unspecified trimester: O09.529

## 2014-05-31 HISTORY — DX: Excessive and frequent menstruation with regular cycle: N92.0

## 2014-05-31 LAB — POCT URINE PREGNANCY: Preg Test, Ur: POSITIVE

## 2014-05-31 NOTE — Progress Notes (Signed)
Subjective:     Patient ID: [name], female   DOB: [birth year], 33 y.o.   MRN: [id number]  HPI [name] is a 33 year old white female, married in for UPT, has had 2 HPT.She has some nausea and spotted first of December, LMP November.  Review of Systems See HPI Reviewed past medical,surgical, social and family history. Reviewed medications and allergies.     Objective:   Physical Exam BP 122/78 mmHg  Ht 4\' 11"  (1.499 m)  Wt 119 lb (53.978 kg)  BMI 24.02 kg/m2  LMP 04/18/2014   UPT +, about 6+1 week by LMP, with EDD 01/24/15. She has had some alcohol but no more and she is nervous, has had 1 miscarriage in past but has 57 year old daughter.No spotting now or pain.  Assessment:     Pregnant Spotting AMA    Plan:     Check QHCG and progesterone Return in 1 week for dating [place] Review handout on first trimester Declines nausea meds, eat often

## 2014-05-31 NOTE — Patient Instructions (Signed)
First Trimester of Pregnancy The first trimester of pregnancy is from week 1 until the end of week 12 (months 1 through 3). A week after a sperm fertilizes an egg, the egg will implant on the wall of the uterus. This embryo will begin to develop into a baby. Genes from you and your partner are forming the baby. The female genes determine whether the baby is a boy or a girl. At 6-8 weeks, the eyes and face are formed, and the heartbeat can be seen on ultrasound. At the end of 12 weeks, all the baby's organs are formed.  Now that you are pregnant, you will want to do everything you can to have a healthy baby. Two of the most important things are to get good prenatal care and to follow your health care provider's instructions. Prenatal care is all the medical care you receive before the baby's birth. This care will help prevent, find, and treat any problems during the pregnancy and childbirth. BODY CHANGES Your body goes through many changes during pregnancy. The changes vary from woman to woman.   You may gain or lose a couple of pounds at first.  You may feel sick to your stomach (nauseous) and throw up (vomit). If the vomiting is uncontrollable, call your health care provider.  You may tire easily.  You may develop headaches that can be relieved by medicines approved by your health care provider.  You may urinate more often. Painful urination may mean you have a bladder infection.  You may develop heartburn as a result of your pregnancy.  You may develop constipation because certain hormones are causing the muscles that push waste through your intestines to slow down.  You may develop hemorrhoids or swollen, bulging veins (varicose veins).  Your breasts may begin to grow larger and become tender. Your nipples may stick out more, and the tissue that surrounds them (areola) may become darker.  Your gums may bleed and may be sensitive to brushing and flossing.  Dark spots or blotches (chloasma,  mask of pregnancy) may develop on your face. This will likely fade after the baby is born.  Your menstrual periods will stop.  You may have a loss of appetite.  You may develop cravings for certain kinds of food.  You may have changes in your emotions from day to day, such as being excited to be pregnant or being concerned that something may go wrong with the pregnancy and baby.  You may have more vivid and strange dreams.  You may have changes in your hair. These can include thickening of your hair, rapid growth, and changes in texture. Some women also have hair loss during or after pregnancy, or hair that feels dry or thin. Your hair will most likely return to normal after your baby is born. WHAT TO EXPECT AT YOUR PRENATAL VISITS During a routine prenatal visit:  You will be weighed to make sure you and the baby are growing normally.  Your blood pressure will be taken.  Your abdomen will be measured to track your baby's growth.  The fetal heartbeat will be listened to starting around week 10 or 12 of your pregnancy.  Test results from any previous visits will be discussed. Your health care provider may ask you:  How you are feeling.  If you are feeling the baby move.  If you have had any abnormal symptoms, such as leaking fluid, bleeding, severe headaches, or abdominal cramping.  If you have any questions. Other tests   that may be performed during your first trimester include:  Blood tests to find your blood type and to check for the presence of any previous infections. They will also be used to check for low iron levels (anemia) and Rh antibodies. Later in the pregnancy, blood tests for diabetes will be done along with other tests if problems develop.  Urine tests to check for infections, diabetes, or protein in the urine.  An ultrasound to confirm the proper growth and development of the baby.  An amniocentesis to check for possible genetic problems.  Fetal screens for  spina bifida and Down syndrome.  You may need other tests to make sure you and the baby are doing well. HOME CARE INSTRUCTIONS  Medicines  Follow your health care provider's instructions regarding medicine use. Specific medicines may be either safe or unsafe to take during pregnancy.  Take your prenatal vitamins as directed.  If you develop constipation, try taking a stool softener if your health care provider approves. Diet  Eat regular, well-balanced meals. Choose a variety of foods, such as meat or vegetable-based protein, fish, milk and low-fat dairy products, vegetables, fruits, and whole grain breads and cereals. Your health care provider will help you determine the amount of weight gain that is right for you.  Avoid raw meat and uncooked cheese. These carry germs that can cause birth defects in the baby.  Eating four or five small meals rather than three large meals a day may help relieve nausea and vomiting. If you start to feel nauseous, eating a few soda crackers can be helpful. Drinking liquids between meals instead of during meals also seems to help nausea and vomiting.  If you develop constipation, eat more high-fiber foods, such as fresh vegetables or fruit and whole grains. Drink enough fluids to keep your urine clear or pale yellow. Activity and Exercise  Exercise only as directed by your health care provider. Exercising will help you:  Control your weight.  Stay in shape.  Be prepared for labor and delivery.  Experiencing pain or cramping in the lower abdomen or low back is a good sign that you should stop exercising. Check with your health care provider before continuing normal exercises.  Try to avoid standing for long periods of time. Move your legs often if you must stand in one place for a long time.  Avoid heavy lifting.  Wear low-heeled shoes, and practice good posture.  You may continue to have sex unless your health care provider directs you  otherwise. Relief of Pain or Discomfort  Wear a good support bra for breast tenderness.   Take warm sitz baths to soothe any pain or discomfort caused by hemorrhoids. Use hemorrhoid cream if your health care provider approves.   Rest with your legs elevated if you have leg cramps or low back pain.  If you develop varicose veins in your legs, wear support hose. Elevate your feet for 15 minutes, 3-4 times a day. Limit salt in your diet. Prenatal Care  Schedule your prenatal visits by the twelfth week of pregnancy. They are usually scheduled monthly at first, then more often in the last 2 months before delivery.  Write down your questions. Take them to your prenatal visits.  Keep all your prenatal visits as directed by your health care provider. Safety  Wear your seat belt at all times when driving.  Make a list of emergency phone numbers, including numbers for family, friends, the hospital, and police and fire departments. General Tips    Ask your health care provider for a referral to a local prenatal education class. Begin classes no later than at the beginning of month 6 of your pregnancy.  Ask for help if you have counseling or nutritional needs during pregnancy. Your health care provider can offer advice or refer you to specialists for help with various needs.  Do not use hot tubs, steam rooms, or saunas.  Do not douche or use tampons or scented sanitary pads.  Do not cross your legs for long periods of time.  Avoid cat litter boxes and soil used by cats. These carry germs that can cause birth defects in the baby and possibly loss of the fetus by miscarriage or stillbirth.  Avoid all smoking, herbs, alcohol, and medicines not prescribed by your health care provider. Chemicals in these affect the formation and growth of the baby.  Schedule a dentist appointment. At home, brush your teeth with a soft toothbrush and be gentle when you floss. SEEK MEDICAL CARE IF:   You have  dizziness.  You have mild pelvic cramps, pelvic pressure, or nagging pain in the abdominal area.  You have persistent nausea, vomiting, or diarrhea.  You have a bad smelling vaginal discharge.  You have pain with urination.  You notice increased swelling in your face, hands, legs, or ankles. SEEK IMMEDIATE MEDICAL CARE IF:   You have a fever.  You are leaking fluid from your vagina.  You have spotting or bleeding from your vagina.  You have severe abdominal cramping or pain.  You have rapid weight gain or loss.  You vomit blood or material that looks like coffee grounds.  You are exposed to German measles and have never had them.  You are exposed to fifth disease or chickenpox.  You develop a severe headache.  You have shortness of breath.  You have any kind of trauma, such as from a fall or a car accident. Document Released: 05/28/2001 Document Revised: 10/18/2013 Document Reviewed: 04/13/2013 ExitCare Patient Information 2015 [employer]. This information is not intended to replace advice given to you by your health care provider. Make sure you discuss any questions you have with your health care provider. Return in 1 week for dating [place] 

## 2014-06-01 ENCOUNTER — Telehealth: Payer: Self-pay | Admitting: Adult Health

## 2014-06-01 LAB — PROGESTERONE: Progesterone: 19.2 ng/mL

## 2014-06-01 LAB — HCG, QUANTITATIVE, PREGNANCY: hCG, Beta Chain, Quant, S: 64893.3 m[IU]/mL

## 2014-06-01 NOTE — Telephone Encounter (Signed)
Left message x 1. JSY 

## 2014-06-01 NOTE — Telephone Encounter (Signed)
Spoke with pt letting her know quant was good at [phone] and progesterone was good at 19.2. Pt has an [place] scheduled for 12/30. Advised to keep that appt and let us know if she has any problems. Pt voiced understanding. [place]

## 2014-06-01 NOTE — Telephone Encounter (Signed)
Left message that [facility] [phone] and progesterone 19.2 which is good, will see next week for [place]

## 2014-06-15 ENCOUNTER — Other Ambulatory Visit: Payer: Self-pay | Admitting: Adult Health

## 2014-06-15 ENCOUNTER — Ambulatory Visit (INDEPENDENT_AMBULATORY_CARE_PROVIDER_SITE_OTHER): Payer: BC Managed Care – PPO

## 2014-06-15 DIAGNOSIS — O26841 Uterine size-date discrepancy, first trimester: Secondary | ICD-10-CM

## 2014-06-15 DIAGNOSIS — Z3490 Encounter for supervision of normal pregnancy, unspecified, unspecified trimester: Secondary | ICD-10-CM

## 2014-06-15 NOTE — Progress Notes (Signed)
U/S-single IUP with +FCA noted, FHR-181 bpm, CRL c/w 9+1wks EDD 01/17/2015, cx appears closed, bilateral adnexa appears WNL with C.L. Noted on Lt, multiple fibroids noted (3 in #) (largest=4.9cm), posterior Gr 0 placenta

## 2014-06-21 ENCOUNTER — Telehealth: Payer: Self-pay | Admitting: Women's Health

## 2014-06-22 ENCOUNTER — Telehealth: Payer: Self-pay | Admitting: Adult Health

## 2014-06-22 NOTE — Telephone Encounter (Signed)
Spoke with pt. Pt is still having pregnancy symptoms, nausea, breast tenderness, no spotting, not cramping, feels tired. Pt states her tailbone is sore. She has been walking a lot lately. Pt is worried something is wrong. I advised pt that everything sounds good and that her hormones are raging. Pt just wanted to talk to someone, and felt better after our conversation. She has an appt scheduled for next week. I advised to keep that appt and don't hesitate to call with further questions. Pt voiced understanding. Gabbs

## 2014-06-29 ENCOUNTER — Ambulatory Visit (INDEPENDENT_AMBULATORY_CARE_PROVIDER_SITE_OTHER): Payer: BC Managed Care – PPO | Admitting: Women's Health

## 2014-06-29 ENCOUNTER — Other Ambulatory Visit (HOSPITAL_COMMUNITY)
Admission: RE | Admit: 2014-06-29 | Discharge: 2014-06-29 | Disposition: A | Discharge status: Home / Self Care | Payer: BLUE CROSS/BLUE SHIELD | Source: Ambulatory Visit | Attending: Obstetrics & Gynecology | Admitting: Obstetrics & Gynecology

## 2014-06-29 ENCOUNTER — Encounter: Payer: Self-pay | Admitting: Women's Health

## 2014-06-29 VITALS — BP 122/72 | Wt 123.0 lb

## 2014-06-29 DIAGNOSIS — Z113 Encounter for screening for infections with a predominantly sexual mode of transmission: Secondary | ICD-10-CM

## 2014-06-29 DIAGNOSIS — O0990 Supervision of high risk pregnancy, unspecified, unspecified trimester: Secondary | ICD-10-CM | POA: Insufficient documentation

## 2014-06-29 DIAGNOSIS — O3410 Maternal care for benign tumor of corpus uteri, unspecified trimester: Secondary | ICD-10-CM

## 2014-06-29 DIAGNOSIS — Z0283 Encounter for blood-alcohol and blood-drug test: Secondary | ICD-10-CM

## 2014-06-29 DIAGNOSIS — Z3481 Encounter for supervision of other normal pregnancy, first trimester: Secondary | ICD-10-CM

## 2014-06-29 DIAGNOSIS — O09211 Supervision of pregnancy with history of pre-term labor, first trimester: Secondary | ICD-10-CM

## 2014-06-29 DIAGNOSIS — Z1389 Encounter for screening for other disorder: Secondary | ICD-10-CM

## 2014-06-29 DIAGNOSIS — Z0184 Encounter for antibody response examination: Secondary | ICD-10-CM

## 2014-06-29 DIAGNOSIS — Z124 Encounter for screening for malignant neoplasm of cervix: Secondary | ICD-10-CM

## 2014-06-29 DIAGNOSIS — Z331 Pregnant state, incidental: Secondary | ICD-10-CM

## 2014-06-29 DIAGNOSIS — Z3491 Encounter for supervision of normal pregnancy, unspecified, first trimester: Secondary | ICD-10-CM

## 2014-06-29 DIAGNOSIS — Z01411 Encounter for gynecological examination (general) (routine) with abnormal findings: Secondary | ICD-10-CM | POA: Insufficient documentation

## 2014-06-29 DIAGNOSIS — D259 Leiomyoma of uterus, unspecified: Secondary | ICD-10-CM

## 2014-06-29 DIAGNOSIS — Z23 Encounter for immunization: Secondary | ICD-10-CM

## 2014-06-29 DIAGNOSIS — Z114 Encounter for screening for human immunodeficiency virus [HIV]: Secondary | ICD-10-CM

## 2014-06-29 DIAGNOSIS — Z1151 Encounter for screening for human papillomavirus (HPV): Secondary | ICD-10-CM | POA: Insufficient documentation

## 2014-06-29 DIAGNOSIS — Z1159 Encounter for screening for other viral diseases: Secondary | ICD-10-CM

## 2014-06-29 DIAGNOSIS — Z118 Encounter for screening for other infectious and parasitic diseases: Secondary | ICD-10-CM

## 2014-06-29 DIAGNOSIS — O09219 Supervision of pregnancy with history of pre-term labor, unspecified trimester: Secondary | ICD-10-CM | POA: Insufficient documentation

## 2014-06-29 LAB — POCT URINALYSIS DIPSTICK
Blood, UA: NEGATIVE
Glucose, UA: NEGATIVE
Ketones, UA: NEGATIVE
Leukocytes, UA: NEGATIVE
Nitrite, UA: NEGATIVE
Protein, UA: NEGATIVE

## 2014-06-29 NOTE — Progress Notes (Signed)
  Subjective:  [name] is a 34 y.o. [phone] Caucasian female at [redacted]w[redacted]d by 9wk u/s, being seen today for her first obstetrical visit.  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 AMA @ 33yo. 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  Pregnancy history fully reviewed.  Patient reports no complaints. Denies vb, cramping, uti s/s, abnormal/malodorous vag d/c, or vulvovaginal itching/irritation.  BP 122/72 mmHg  Wt 123 lb (55.792 kg)  LMP 04/18/2014  HISTORY: OB History  Gravida Para Term Preterm AB SAB TAB Ectopic Multiple Living  3 1  1 1 1    1     # Outcome Date GA Lbr Len/2nd Weight Sex Delivery Anes PTL Lv  3 Current           2 Preterm 11/25/09 [redacted]w[redacted]d  5 lb 9 oz (2.523 kg) F Vag-Spont EPI Y Y  1 SAB  [redacted]w[redacted]d            Past Medical History  Diagnosis Date  . Klippel-Feil deformity     syrinx  . Asthma   . Hypertension   . Pregnant 05/31/2014  . Spotting 05/31/2014  . AMA (advanced maternal age) multigravida 35+ 05/31/2014   Past Surgical History  Procedure Laterality Date  . Cholecystectomy     Family History  Problem Relation Age of Onset  . Diabetes Paternal Grandfather   . Cancer Paternal Grandfather     lung  . Diabetes Paternal Grandmother   . Diabetes Maternal Grandmother   . Diabetes Maternal Grandfather   . Hypothyroidism Mother     Exam   System:     General: Well developed & nourished, no acute distress   Skin: Warm & dry, normal coloration and turgor, no rashes   Neurologic: Alert & oriented, normal mood   Cardiovascular: Regular rate & rhythm   Respiratory: Effort & rate normal, LCTAB, acyanotic   Abdomen: Soft, non tender   Extremities: normal strength, tone   Pelvic Exam:    Perineum: Normal perineum   Vulva: Normal, no lesions   Vagina:  Normal mucosa, normal discharge   Cervix: Normal, bulbous, appears closed   Uterus: Normal size/shape/contour for GA   Thin prep pap smear obtained w/  high risk HPV cotesting FHR: 171 via doppler   Assessment:   Pregnancy: [id number] Patient Active Problem List   Diagnosis Date Noted  . Supervision of normal pregnancy 06/29/2014    Priority: High  . Uterine fibroid during pregnancy, antepartum 06/29/2014    Priority: High  . Previous preterm delivery, antepartum 06/29/2014    Priority: High  . Spotting 05/31/2014  . AMA (advanced maternal age) multigravida 35+ 05/31/2014  . Acute upper respiratory infections of unspecified site 09/07/2012  . Asthma   . Hypertension     [redacted]w[redacted]d [id number] New OB visit H/O spontaneous PTB H/O CHTN- resolved x 18yrs AMA  Plan:  Initial labs drawn Continue prenatal vitamins Problem list reviewed and updated Reviewed n/v relief measures and warning s/s to report Reviewed recommended weight gain based on pre-gravid BMI Encouraged well-balanced diet Genetic Screening discussed Integrated Screen: declined Cystic fibrosis screening discussed declined Ultrasound discussed; fetal survey: requested Follow up in 4 weeks for visit Offered Makena, accepted, ordered, to begin at [place] completed  Tawnya Crook CNM, [facility] 06/29/2014 4:30 PM

## 2014-06-29 NOTE — Patient Instructions (Signed)
Nausea & Vomiting  Have saltine crackers or pretzels by your bed and eat a few bites before you raise your head out of bed in the morning  Eat small frequent meals throughout the day instead of large meals  Drink plenty of fluids throughout the day to stay hydrated, just don't drink a lot of fluids with your meals.  This can make your stomach fill up faster making you feel sick  Do not brush your teeth right after you eat  Products with real ginger are good for nausea, like ginger ale and ginger hard candy Make sure it says made with real ginger!  Sucking on sour candy like lemon heads is also good for nausea  If your prenatal vitamins make you nauseated, take them at night so you will sleep through the nausea  Sea Bands  If you feel like you need medicine for the nausea & vomiting please let us know  If you are unable to keep any fluids or food down please let us know   First Trimester of Pregnancy The first trimester of pregnancy is from week 1 until the end of week 12 (months 1 through 3). A week after a sperm fertilizes an egg, the egg will implant on the wall of the uterus. This embryo will begin to develop into a baby. Genes from you and your partner are forming the baby. The female genes determine whether the baby is a boy or a girl. At 6-8 weeks, the eyes and face are formed, and the heartbeat can be seen on ultrasound. At the end of 12 weeks, all the baby's organs are formed.  Now that you are pregnant, you will want to do everything you can to have a healthy baby. Two of the most important things are to get good prenatal care and to follow your health care provider's instructions. Prenatal care is all the medical care you receive before the baby's birth. This care will help prevent, find, and treat any problems during the pregnancy and childbirth. BODY CHANGES Your body goes through many changes during pregnancy. The changes vary from woman to woman.   You may gain or lose a  couple of pounds at first.  You may feel sick to your stomach (nauseous) and throw up (vomit). If the vomiting is uncontrollable, call your health care provider.  You may tire easily.  You may develop headaches that can be relieved by medicines approved by your health care provider.  You may urinate more often. Painful urination may mean you have a bladder infection.  You may develop heartburn as a result of your pregnancy.  You may develop constipation because certain hormones are causing the muscles that push waste through your intestines to slow down.  You may develop hemorrhoids or swollen, bulging veins (varicose veins).  Your breasts may begin to grow larger and become tender. Your nipples may stick out more, and the tissue that surrounds them (areola) may become darker.  Your gums may bleed and may be sensitive to brushing and flossing.  Dark spots or blotches (chloasma, mask of pregnancy) may develop on your face. This will likely fade after the baby is born.  Your menstrual periods will stop.  You may have a loss of appetite.  You may develop cravings for certain kinds of food.  You may have changes in your emotions from day to day, such as being excited to be pregnant or being concerned that something may go wrong with the pregnancy and baby.  You may have more vivid and strange dreams.  You may have changes in your hair. These can include thickening of your hair, rapid growth, and changes in texture. Some women also have hair loss during or after pregnancy, or hair that feels dry or thin. Your hair will most likely return to normal after your baby is born. WHAT TO EXPECT AT YOUR PRENATAL VISITS During a routine prenatal visit:  You will be weighed to make sure you and the baby are growing normally.  Your blood pressure will be taken.  Your abdomen will be measured to track your baby's growth.  The fetal heartbeat will be listened to starting around week 10 or 12  of your pregnancy.  Test results from any previous visits will be discussed. Your health care provider may ask you:  How you are feeling.  If you are feeling the baby move.  If you have had any abnormal symptoms, such as leaking fluid, bleeding, severe headaches, or abdominal cramping.  If you have any questions. Other tests that may be performed during your first trimester include:  Blood tests to find your blood type and to check for the presence of any previous infections. They will also be used to check for low iron levels (anemia) and Rh antibodies. Later in the pregnancy, blood tests for diabetes will be done along with other tests if problems develop.  Urine tests to check for infections, diabetes, or protein in the urine.  An ultrasound to confirm the proper growth and development of the baby.  An amniocentesis to check for possible genetic problems.  Fetal screens for spina bifida and Down syndrome.  You may need other tests to make sure you and the baby are doing well. HOME CARE INSTRUCTIONS  Medicines  Follow your health care provider's instructions regarding medicine use. Specific medicines may be either safe or unsafe to take during pregnancy.  Take your prenatal vitamins as directed.  If you develop constipation, try taking a stool softener if your health care provider approves. Diet  Eat regular, well-balanced meals. Choose a variety of foods, such as meat or vegetable-based protein, fish, milk and low-fat dairy products, vegetables, fruits, and whole grain breads and cereals. Your health care provider will help you determine the amount of weight gain that is right for you.  Avoid raw meat and uncooked cheese. These carry germs that can cause birth defects in the baby.  Eating four or five small meals rather than three large meals a day may help relieve nausea and vomiting. If you start to feel nauseous, eating a few soda crackers can be helpful. Drinking liquids  between meals instead of during meals also seems to help nausea and vomiting.  If you develop constipation, eat more high-fiber foods, such as fresh vegetables or fruit and whole grains. Drink enough fluids to keep your urine clear or pale yellow. Activity and Exercise  Exercise only as directed by your health care provider. Exercising will help you:  Control your weight.  Stay in shape.  Be prepared for labor and delivery.  Experiencing pain or cramping in the lower abdomen or low back is a good sign that you should stop exercising. Check with your health care provider before continuing normal exercises.  Try to avoid standing for long periods of time. Move your legs often if you must stand in one place for a long time.  Avoid heavy lifting.  Wear low-heeled shoes, and practice good posture.  You may continue to have  sex unless your health care provider directs you otherwise. Relief of Pain or Discomfort  Wear a good support bra for breast tenderness.   Take warm sitz baths to soothe any pain or discomfort caused by hemorrhoids. Use hemorrhoid cream if your health care provider approves.   Rest with your legs elevated if you have leg cramps or low back pain.  If you develop varicose veins in your legs, wear support hose. Elevate your feet for 15 minutes, 3-4 times a day. Limit salt in your diet. Prenatal Care  Schedule your prenatal visits by the twelfth week of pregnancy. They are usually scheduled monthly at first, then more often in the last 2 months before delivery.  Write down your questions. Take them to your prenatal visits.  Keep all your prenatal visits as directed by your health care provider. Safety  Wear your seat belt at all times when driving.  Make a list of emergency phone numbers, including numbers for family, friends, the hospital, and police and fire departments. General Tips  Ask your health care provider for a referral to a local prenatal education  class. Begin classes no later than at the beginning of month 6 of your pregnancy.  Ask for help if you have counseling or nutritional needs during pregnancy. Your health care provider can offer advice or refer you to specialists for help with various needs.  Do not use hot tubs, steam rooms, or saunas.  Do not douche or use tampons or scented sanitary pads.  Do not cross your legs for long periods of time.  Avoid cat litter boxes and soil used by cats. These carry germs that can cause birth defects in the baby and possibly loss of the fetus by miscarriage or stillbirth.  Avoid all smoking, herbs, alcohol, and medicines not prescribed by your health care provider. Chemicals in these affect the formation and growth of the baby.  Schedule a dentist appointment. At home, brush your teeth with a soft toothbrush and be gentle when you floss. SEEK MEDICAL CARE IF:   You have dizziness.  You have mild pelvic cramps, pelvic pressure, or nagging pain in the abdominal area.  You have persistent nausea, vomiting, or diarrhea.  You have a bad smelling vaginal discharge.  You have pain with urination.  You notice increased swelling in your face, hands, legs, or ankles. SEEK IMMEDIATE MEDICAL CARE IF:   You have a fever.  You are leaking fluid from your vagina.  You have spotting or bleeding from your vagina.  You have severe abdominal cramping or pain.  You have rapid weight gain or loss.  You vomit blood or material that looks like coffee grounds.  You are exposed to [place] measles and have never had them.  You are exposed to fifth disease or chickenpox.  You develop a severe headache.  You have shortness of breath.  You have any kind of trauma, such as from a fall or a car accident. Document Released: 05/28/2001 Document Revised: 10/18/2013 Document Reviewed: 04/13/2013 [facility] Patient Information 2015 [place], [place]. This information is not intended to replace advice given  to you by your health care provider. Make sure you discuss any questions you have with your health care provider.

## 2014-06-30 LAB — ABO AND RH: Rh Type: POSITIVE

## 2014-06-30 LAB — DRUG SCREEN, URINE, NO CONFIRMATION
Amphetamine Screen, Ur: NEGATIVE
Barbiturate Quant, Ur: NEGATIVE
Benzodiazepines.: NEGATIVE
Cocaine Metabolites: NEGATIVE
Creatinine,U: 95.7 mg/dL
Marijuana Metabolite: NEGATIVE
Methadone: NEGATIVE
Opiate Screen, Urine: NEGATIVE
Phencyclidine (PCP): NEGATIVE
Propoxyphene: NEGATIVE

## 2014-06-30 LAB — HEPATITIS B SURFACE ANTIGEN: Hepatitis B Surface Ag: NEGATIVE

## 2014-06-30 LAB — URINALYSIS, ROUTINE W REFLEX MICROSCOPIC
Bilirubin Urine: NEGATIVE
Glucose, UA: NEGATIVE mg/dL
Hgb urine dipstick: NEGATIVE
Ketones, ur: NEGATIVE mg/dL
Leukocytes, UA: NEGATIVE
Nitrite: NEGATIVE
Protein, ur: NEGATIVE mg/dL
Specific Gravity, Urine: 1.016 (ref 1.005–1.030)
Urobilinogen, UA: 0.2 mg/dL (ref 0.0–1.0)
pH: 7.5 (ref 5.0–8.0)

## 2014-06-30 LAB — URINALYSIS, MICROSCOPIC ONLY
Bacteria, UA: NONE SEEN
Casts: NONE SEEN
Squamous Epithelial / LPF: NONE SEEN

## 2014-06-30 LAB — CBC
HCT: 37.9 % (ref 36.0–46.0)
Hemoglobin: 12.8 g/dL (ref 12.0–15.0)
MCH: 30.5 pg (ref 26.0–34.0)
MCHC: 33.8 g/dL (ref 30.0–36.0)
MCV: 90.5 fL (ref 78.0–100.0)
MPV: 9 fL (ref 8.6–12.4)
Platelets: 388 10*3/uL (ref 150–400)
RBC: 4.19 MIL/uL (ref 3.87–5.11)
RDW: 13 % (ref 11.5–15.5)
WBC: 8.6 10*3/uL (ref 4.0–10.5)

## 2014-06-30 LAB — RPR

## 2014-06-30 LAB — HIV ANTIBODY (ROUTINE TESTING W REFLEX): HIV 1&2 Ab, 4th Generation: NONREACTIVE

## 2014-06-30 LAB — OXYCODONE SCREEN, UA, RFLX CONFIRM: Oxycodone Screen, Ur: NEGATIVE ng/mL

## 2014-06-30 LAB — VARICELLA ZOSTER ANTIBODY, IGG: Varicella IgG: 991.1 Index — ABNORMAL HIGH (ref <135.00)

## 2014-06-30 LAB — ANTIBODY SCREEN: Antibody Screen: NEGATIVE

## 2014-06-30 LAB — RUBELLA SCREEN: Rubella: 1.3 Index — ABNORMAL HIGH (ref <0.90)

## 2014-07-01 ENCOUNTER — Telehealth: Payer: Self-pay | Admitting: *Deleted

## 2014-07-01 DIAGNOSIS — O09211 Supervision of pregnancy with history of pre-term labor, first trimester: Secondary | ICD-10-CM

## 2014-07-01 DIAGNOSIS — Z3491 Encounter for supervision of normal pregnancy, unspecified, first trimester: Secondary | ICD-10-CM

## 2014-07-01 LAB — URINE CULTURE: Colony Count: 10000

## 2014-07-01 LAB — CYTOLOGY - PAP

## 2014-07-04 ENCOUNTER — Encounter: Payer: Self-pay | Admitting: Women's Health

## 2014-07-04 DIAGNOSIS — R87610 Atypical squamous cells of undetermined significance on cytologic smear of cervix (ASC-US): Secondary | ICD-10-CM | POA: Insufficient documentation

## 2014-07-04 NOTE — Telephone Encounter (Signed)
Pt stated that she had some spotting after a BM Friday. Pt stated that she strained to use the bathroom Friday and had a little bit of spotting after but had not had any since. I advised the pt that it can be normal to have some spotting after BM, and sex. Pt stated that she just wanted to let someone know.

## 2014-07-11 ENCOUNTER — Telehealth: Payer: Self-pay | Admitting: Women's Health

## 2014-07-11 NOTE — Telephone Encounter (Signed)
Pt informed that we will plan on checking TSH at next office visit and make sure and take PNV daily, pt verbalized understanding.

## 2014-07-11 NOTE — Telephone Encounter (Signed)
Pt states that she is loosing hair, not in clumps but quite a bit comes out during showers.  Pt states she is concerned because she has a family history of thyroid problems, her mother has hypothyroidism and she states she did not loose hair with her prior pregnancy.  Please advise

## 2014-07-14 ENCOUNTER — Ambulatory Visit (INDEPENDENT_AMBULATORY_CARE_PROVIDER_SITE_OTHER): Payer: BC Managed Care – PPO | Admitting: Advanced Practice Midwife

## 2014-07-14 ENCOUNTER — Encounter: Payer: Self-pay | Admitting: Advanced Practice Midwife

## 2014-07-14 DIAGNOSIS — M545 Low back pain: Secondary | ICD-10-CM

## 2014-07-14 DIAGNOSIS — Z331 Pregnant state, incidental: Secondary | ICD-10-CM

## 2014-07-14 DIAGNOSIS — Z1389 Encounter for screening for other disorder: Secondary | ICD-10-CM

## 2014-07-14 LAB — POCT URINALYSIS DIPSTICK
Blood, UA: NEGATIVE
Glucose, UA: NEGATIVE
Ketones, UA: NEGATIVE
Leukocytes, UA: NEGATIVE
Nitrite, UA: NEGATIVE
Protein, UA: NEGATIVE

## 2014-07-14 NOTE — Progress Notes (Signed)
[id number] [redacted]w[redacted]d Estimated Date of Delivery: 01/17/15  Blood pressure 120/80, last menstrual period 04/18/2014.    WORK IN FOR LEFT SIDED PAIN SINCE LAST NIGHT. Placenta is posterior  Has multiple fibroids. Had a huge meal last night,  FEELS CONSTIPATED.  Drank prune juice.  Has had 3 small BMs today, but it had been several days since normal BM.  Has knots bilaterally on sup. Ischial spines.  Injected them with 20cc marcaine with LHE.  Likely pain originates in lower back, radiating to Left side.   BP weight and urine results all reviewed and noted.  FHR 150  All questions were answered.  Plan:  Continued routine obstetrical care.  Excercises with lacrosse ball to release muscle taught.   Follow up in as scheduled for OB appointment,

## 2014-07-18 ENCOUNTER — Telehealth: Payer: Self-pay | Admitting: Women's Health

## 2014-07-27 ENCOUNTER — Ambulatory Visit (INDEPENDENT_AMBULATORY_CARE_PROVIDER_SITE_OTHER): Payer: BC Managed Care – PPO | Admitting: Advanced Practice Midwife

## 2014-07-27 VITALS — BP 130/78 | Wt 129.0 lb

## 2014-07-27 DIAGNOSIS — L659 Nonscarring hair loss, unspecified: Secondary | ICD-10-CM

## 2014-07-27 DIAGNOSIS — Z331 Pregnant state, incidental: Secondary | ICD-10-CM

## 2014-07-27 DIAGNOSIS — Z131 Encounter for screening for diabetes mellitus: Secondary | ICD-10-CM

## 2014-07-27 DIAGNOSIS — Z1389 Encounter for screening for other disorder: Secondary | ICD-10-CM

## 2014-07-27 DIAGNOSIS — Z363 Encounter for antenatal screening for malformations: Secondary | ICD-10-CM

## 2014-07-27 DIAGNOSIS — Z3492 Encounter for supervision of normal pregnancy, unspecified, second trimester: Secondary | ICD-10-CM

## 2014-07-27 LAB — POCT URINALYSIS DIPSTICK
Glucose, UA: NEGATIVE
Ketones, UA: NEGATIVE
Leukocytes, UA: NEGATIVE
Nitrite, UA: NEGATIVE
Protein, UA: NEGATIVE

## 2014-07-27 NOTE — Addendum Note (Signed)
Addended by: Christin Fudge on: 07/27/2014 03:43 PM   Modules accepted: Orders

## 2014-07-27 NOTE — Progress Notes (Signed)
[id number] [redacted]w[redacted]d Estimated Date of Delivery: 01/17/15  Blood pressure 130/78, weight 129 lb (58.514 kg), last menstrual period 04/18/2014.   BP weight and urine results all reviewed and noted.  Please refer to the obstetrical flow sheet for the fundal height and fetal heart rate documentation:  Patient denies any bleeding and no rupture of membranes symptoms or regular contractions. Has been exercising with ball every night and has a lot of relief.  Patient is without complaints. All questions were answered.  Plan:  Continued routine obstetrical care,   Follow up in 4 weeks for OB appointment, anatomy ultrasound

## 2014-07-27 NOTE — Telephone Encounter (Signed)
Spoke with patient and she states all questions were answered when someone from the pharmacy called her about the 17P injections and she has decided not to get them, she states she had already discussed things with both Dr. Elonda Husky and Manus Gunning and she feels good in her decision to to get the injections.

## 2014-07-28 LAB — HEMOGLOBIN A1C
Est. average glucose Bld gHb Est-mCnc: 108 mg/dL
Hgb A1c MFr Bld: 5.4 % (ref 4.8–5.6)

## 2014-07-28 LAB — TSH: TSH: 3.49 u[IU]/mL (ref 0.450–4.500)

## 2014-07-29 ENCOUNTER — Telehealth: Payer: Self-pay | Admitting: Adult Health

## 2014-07-29 NOTE — Telephone Encounter (Signed)
Spoke with pt. Pt has congestion and cough. I advised can try plain Sudafed and plain Robitussin. Also advised to run a cool mist humidifier in bedroom at night. If pt gets worse, starts with fever, wheezing, or just not getting better, call us back. Pt voiced understanding. [place]

## 2014-08-02 ENCOUNTER — Telehealth: Payer: Self-pay | Admitting: Women's Health

## 2014-08-02 DIAGNOSIS — O09212 Supervision of pregnancy with history of pre-term labor, second trimester: Secondary | ICD-10-CM

## 2014-08-02 DIAGNOSIS — Z3492 Encounter for supervision of normal pregnancy, unspecified, second trimester: Secondary | ICD-10-CM

## 2014-08-02 NOTE — Telephone Encounter (Signed)
Pt aware of results. Pt very excited everything is in normal range.

## 2014-08-04 ENCOUNTER — Telehealth: Payer: Self-pay | Admitting: Obstetrics & Gynecology

## 2014-08-04 NOTE — Telephone Encounter (Signed)
Pt states her symptoms have not improved since last week after taking OTC meds as advised by our office. She now c/o a thick greenish nasal drainage,no fever, thinks she could have a sinus infection. Call transferred to front staff for an appt to be scheduled.

## 2014-08-05 ENCOUNTER — Ambulatory Visit (INDEPENDENT_AMBULATORY_CARE_PROVIDER_SITE_OTHER): Payer: BC Managed Care – PPO | Admitting: Obstetrics & Gynecology

## 2014-08-05 VITALS — BP 110/70 | Temp 98.5°F | Wt 129.0 lb

## 2014-08-05 DIAGNOSIS — Z331 Pregnant state, incidental: Secondary | ICD-10-CM | POA: Diagnosis not present

## 2014-08-05 DIAGNOSIS — J40 Bronchitis, not specified as acute or chronic: Secondary | ICD-10-CM

## 2014-08-05 DIAGNOSIS — Z1389 Encounter for screening for other disorder: Secondary | ICD-10-CM

## 2014-08-05 LAB — POCT URINALYSIS DIPSTICK
Glucose, UA: NEGATIVE
Ketones, UA: NEGATIVE
Leukocytes, UA: NEGATIVE
Nitrite, UA: NEGATIVE

## 2014-08-05 MED ORDER — AZITHROMYCIN 250 MG PO TABS: ORAL_TABLET | ORAL | Status: DC

## 2014-08-05 NOTE — Progress Notes (Signed)
Work In [employer] visit;  Chief Complaint  Patient presents with  . Routine Prenatal Visit    c/c  sinus drainage/ green in color / coughing.    HPI Stuffy some cough no fever several days no pregnancy specific issues  ROS No nausea vomiting  Blood pressure 110/70, temperature 98.5 F (36.9 C), weight 129 lb (58.514 kg), last menstrual period 04/18/2014.  EXAM Face + pressure on sinuses ethmoid and maxillary Pharynx clear Lungs clear course sounds no consolidation  Assessment/Plan:  [redacted]w[redacted]d bronchitis  z pak and OTC decongestants No indication for steroids at present

## 2014-08-16 ENCOUNTER — Telehealth: Payer: Self-pay | Admitting: Advanced Practice Midwife

## 2014-08-16 NOTE — Telephone Encounter (Signed)
Pt states that she is having lower right flank pain," kidney area". Pt states that the pain started yesterday but has gotten worse this morning. Pt states that as long as she has a full bladder there is no pain and if she voids the pain comes back. Pt states that she doe snot have any burning with urination at all. Pt states that she has increased her fluids and has noticed that her urine is a little darker.    I spoke with Mylo Red- Dishmon and she advised for the pt to continue to push her fluids and rest and to call the office back if the pain gets worse to be worked in. The pt verbalized understanding.

## 2014-08-24 ENCOUNTER — Other Ambulatory Visit: Payer: Self-pay | Admitting: Advanced Practice Midwife

## 2014-08-24 ENCOUNTER — Ambulatory Visit (INDEPENDENT_AMBULATORY_CARE_PROVIDER_SITE_OTHER): Payer: BC Managed Care – PPO | Admitting: Obstetrics and Gynecology

## 2014-08-24 ENCOUNTER — Ambulatory Visit (INDEPENDENT_AMBULATORY_CARE_PROVIDER_SITE_OTHER): Payer: BC Managed Care – PPO

## 2014-08-24 ENCOUNTER — Encounter: Payer: Self-pay | Admitting: Obstetrics and Gynecology

## 2014-08-24 VITALS — BP 120/70 | HR 88 | Wt 133.0 lb

## 2014-08-24 DIAGNOSIS — Z363 Encounter for antenatal screening for malformations: Secondary | ICD-10-CM

## 2014-08-24 DIAGNOSIS — Z36 Encounter for antenatal screening of mother: Secondary | ICD-10-CM

## 2014-08-24 DIAGNOSIS — O09292 Supervision of pregnancy with other poor reproductive or obstetric history, second trimester: Secondary | ICD-10-CM

## 2014-08-24 DIAGNOSIS — O09522 Supervision of elderly multigravida, second trimester: Secondary | ICD-10-CM

## 2014-08-24 DIAGNOSIS — Z131 Encounter for screening for diabetes mellitus: Secondary | ICD-10-CM | POA: Diagnosis not present

## 2014-08-24 DIAGNOSIS — Z1389 Encounter for screening for other disorder: Secondary | ICD-10-CM | POA: Diagnosis not present

## 2014-08-24 DIAGNOSIS — R81 Glycosuria: Secondary | ICD-10-CM

## 2014-08-24 DIAGNOSIS — Z3492 Encounter for supervision of normal pregnancy, unspecified, second trimester: Secondary | ICD-10-CM

## 2014-08-24 DIAGNOSIS — Z331 Pregnant state, incidental: Secondary | ICD-10-CM

## 2014-08-24 LAB — POCT URINALYSIS DIPSTICK
Glucose, UA: 4
Ketones, UA: NEGATIVE
Leukocytes, UA: NEGATIVE
Nitrite, UA: NEGATIVE
Protein, UA: NEGATIVE

## 2014-08-24 LAB — GLUCOSE, POCT (MANUAL RESULT ENTRY): POC Glucose: 128 mg/dl — AB (ref 70–99)

## 2014-08-24 NOTE — Progress Notes (Signed)
U/S(19+1wks)-single active fetus, meas c/w dates, fluid wnl, rt lateral fundal Gr 0 placenta, cx appears closed (3.1cm), largest fibroid-5.4 x 4.9cm, FHR-157 bpm, no obvious abnl noted, female fetus

## 2014-08-24 NOTE — Progress Notes (Signed)
Patient ID: [name], female   DOB: 10/23/1980, 34 y.o.   MRN: [id number]  [id number] [redacted]w[redacted]d Estimated Date of Delivery: 01/17/15  Blood pressure 120/70, pulse 88, weight 133 lb (60.328 kg), last menstrual period 04/18/2014.   refer to the ob flow sheet for FH and FHR, also BP, Wt, Urine results:notable for negative Patient reports  +good fetal movement, denies any bleeding and no rupture of membranes symptoms or regular contractions.   Patient complaints: Patient has no complaints at this time. She does mention that she recently had flank pain and hematuria and passed a small kidney stone. Patient has had a history of kidney stones since she was 9.   FH: 17 cm  Questions were answered. 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 Glucosuria p high glucose breakfast, Hx kidneystones.  Plan:  Continued routine obstetrical care , check CBG this am,  F/u in 4 weeks for routine OB checkup

## 2014-08-24 NOTE — Addendum Note (Signed)
Addended by: Farley Ly on: 08/24/2014 10:19 AM   Modules accepted: Orders

## 2014-08-24 NOTE — Progress Notes (Signed)
Pt states that she had a very sugary breakfast so that the baby would move so they could find out what she is having.

## 2014-09-06 ENCOUNTER — Telehealth: Payer: Self-pay | Admitting: *Deleted

## 2014-09-06 NOTE — Telephone Encounter (Signed)
Pt states is it normal to have FM around "belly button area." Informed is WNL. Pt verbalized understanding.

## 2014-09-16 ENCOUNTER — Encounter: Payer: Self-pay | Admitting: Obstetrics & Gynecology

## 2014-09-16 ENCOUNTER — Ambulatory Visit (INDEPENDENT_AMBULATORY_CARE_PROVIDER_SITE_OTHER): Payer: BC Managed Care – PPO | Admitting: Obstetrics & Gynecology

## 2014-09-16 VITALS — BP 110/70 | HR 88 | Wt 138.0 lb

## 2014-09-16 DIAGNOSIS — Z331 Pregnant state, incidental: Secondary | ICD-10-CM

## 2014-09-16 DIAGNOSIS — Z3492 Encounter for supervision of normal pregnancy, unspecified, second trimester: Secondary | ICD-10-CM

## 2014-09-16 DIAGNOSIS — Z1389 Encounter for screening for other disorder: Secondary | ICD-10-CM

## 2014-09-16 LAB — POCT URINALYSIS DIPSTICK
Blood, UA: NEGATIVE
Glucose, UA: NEGATIVE
Ketones, UA: NEGATIVE
Leukocytes, UA: NEGATIVE
Nitrite, UA: NEGATIVE
Protein, UA: NEGATIVE

## 2014-09-16 NOTE — Addendum Note (Signed)
Addended by: Doyne Keel on: 09/16/2014 10:04 AM   Modules accepted: Orders

## 2014-09-16 NOTE — Progress Notes (Signed)
[id number] [redacted]w[redacted]d Estimated Date of Delivery: 01/17/15  Blood pressure 110/70, pulse 88, weight 138 lb (62.596 kg), last menstrual period 04/18/2014.   BP weight and urine results all reviewed and noted.  Please refer to the obstetrical flow sheet for the fundal height and fetal heart rate documentation:  Patient reports good fetal movement, denies any bleeding and no rupture of membranes symptoms or regular contractions. Patient is without complaints. All questions were answered.  Plan:  Continued routine obstetrical care,   Follow up in 4 weeks for OB appointment, PN2

## 2014-09-27 ENCOUNTER — Telehealth: Payer: Self-pay | Admitting: Obstetrics & Gynecology

## 2014-09-27 NOTE — Telephone Encounter (Signed)
Pt states she feels "very tight, like the baby doesn't have enough room." Pt states +FM, no vaginal FM. Pt also states been on her feet more the past 2 weeks. Informed pt to rest as much as possible this pm, push fluids if no improvement call our office back. Pt verbalized understanding.

## 2014-09-29 ENCOUNTER — Ambulatory Visit (INDEPENDENT_AMBULATORY_CARE_PROVIDER_SITE_OTHER): Payer: BC Managed Care – PPO | Admitting: Advanced Practice Midwife

## 2014-09-29 ENCOUNTER — Encounter: Payer: Self-pay | Admitting: Advanced Practice Midwife

## 2014-09-29 VITALS — BP 110/70 | HR 80 | Wt 141.0 lb

## 2014-09-29 DIAGNOSIS — Z1389 Encounter for screening for other disorder: Secondary | ICD-10-CM

## 2014-09-29 DIAGNOSIS — Z3492 Encounter for supervision of normal pregnancy, unspecified, second trimester: Secondary | ICD-10-CM

## 2014-09-29 DIAGNOSIS — Z331 Pregnant state, incidental: Secondary | ICD-10-CM

## 2014-09-29 LAB — POCT URINALYSIS DIPSTICK
Glucose, UA: NEGATIVE
Ketones, UA: NEGATIVE
Leukocytes, UA: NEGATIVE
Nitrite, UA: NEGATIVE
Protein, UA: NEGATIVE

## 2014-09-29 NOTE — Progress Notes (Signed)
Pt states that she has noticed an increase in " wetness" since yesterday.

## 2014-09-29 NOTE — Progress Notes (Signed)
WORK IN FOR LEAKING FLUID   [id number] [redacted]w[redacted]d Estimated Date of Delivery: 01/17/15  Blood pressure 110/70, pulse 80, weight 141 lb (63.957 kg), last menstrual period 04/18/2014.   BP weight and urine results all reviewed and noted.  Please refer to the obstetrical flow sheet for the fundal height and fetal heart rate documentation Patient reports good fetal movement, denies any bleeding  or regular contractions. States that while she was at school yesterday, she would have liquid come out of her vagina.  It happened off and on throughout the day.  When she got home and wasn't on her feet anymore, she did not have any more leaking.  However, today at school again, while she has been up and moving aroung, she has had some more leaking. Denies itching or irritation.   SSE;  No pooling.  Vaginal discharge appears normal.  NEgative fern and valsalva.  Wet prep negative.  Sounds like bladder leakage.  All questions were answered.  Plan:  Continued routine obstetrical care, [place]  Follow up in as scheduled for OB appointment,

## 2014-10-14 ENCOUNTER — Ambulatory Visit (INDEPENDENT_AMBULATORY_CARE_PROVIDER_SITE_OTHER): Payer: BC Managed Care – PPO | Admitting: Obstetrics and Gynecology

## 2014-10-14 ENCOUNTER — Encounter: Payer: Self-pay | Admitting: Obstetrics and Gynecology

## 2014-10-14 ENCOUNTER — Other Ambulatory Visit: Payer: BC Managed Care – PPO

## 2014-10-14 VITALS — BP 114/76 | HR 80 | Wt 144.0 lb

## 2014-10-14 DIAGNOSIS — Z1389 Encounter for screening for other disorder: Secondary | ICD-10-CM

## 2014-10-14 DIAGNOSIS — Z131 Encounter for screening for diabetes mellitus: Secondary | ICD-10-CM

## 2014-10-14 DIAGNOSIS — Z331 Pregnant state, incidental: Secondary | ICD-10-CM

## 2014-10-14 DIAGNOSIS — Z3493 Encounter for supervision of normal pregnancy, unspecified, third trimester: Secondary | ICD-10-CM

## 2014-10-14 DIAGNOSIS — Z369 Encounter for antenatal screening, unspecified: Secondary | ICD-10-CM

## 2014-10-14 LAB — POCT URINALYSIS DIPSTICK
Blood, UA: NEGATIVE
Glucose, UA: NEGATIVE
Ketones, UA: NEGATIVE
Leukocytes, UA: NEGATIVE
Nitrite, UA: NEGATIVE

## 2014-10-14 NOTE — Progress Notes (Signed)
Patient ID: [name], female   DOB: [birth year], 34 y.o.   MRN: [id number]  [id number] [redacted]w[redacted]d Estimated Date of Delivery: 01/17/15  Blood pressure 114/76, pulse 80, weight 144 lb (65.318 kg), last menstrual period 04/18/2014.   refer to the ob flow sheet for FH and FHR, also BP, Wt, Urine results:notable for negative  Patient reports  + good fetal movement, denies any bleeding and no rupture of membranes symptoms or regular contractions. Patient complaints:Pt denies any complaints at this time.  FH: 25 cm FHR: 152 bpm  Questions were answered. Assessment: [redacted]w[redacted]d; [id number]  Plan:  Continued routine obstetrical care.  F/u in 4 weeks for continued, routine prenatal care   This chart was scribed for Jonnie Kind, MD by Tula Nakayama, ED Scribe. This patient was seen in room 1 and the patient's care was started at 9:50 AM.   I personally performed the services described in this documentation, which was SCRIBED in my presence. The recorded information has been reviewed and considered accurate. It has been edited as necessary during review. Jonnie Kind, MD

## 2014-10-14 NOTE — Progress Notes (Signed)
Pt denies any problems or concerns at this time.  

## 2014-10-15 LAB — CBC
Hematocrit: 36.6 % (ref 34.0–46.6)
Hemoglobin: 12.1 g/dL (ref 11.1–15.9)
MCH: 30.3 pg (ref 26.6–33.0)
MCHC: 33.1 g/dL (ref 31.5–35.7)
MCV: 92 fL (ref 79–97)
Platelets: 310 10*3/uL (ref 150–379)
RBC: 4 x10E6/uL (ref 3.77–5.28)
RDW: 13.4 % (ref 12.3–15.4)
WBC: 9.2 10*3/uL (ref 3.4–10.8)

## 2014-10-15 LAB — HSV 2 ANTIBODY, IGG: HSV 2 Glycoprotein G Ab, IgG: <0.91 index (ref 0.00–0.90)

## 2014-10-15 LAB — GLUCOSE TOLERANCE, 2 HOURS W/ 1HR
Glucose, 1 hour: 215 mg/dL — ABNORMAL HIGH (ref 65–179)
Glucose, 2 hour: 170 mg/dL — ABNORMAL HIGH (ref 65–152)
Glucose, Fasting: 91 mg/dL (ref 65–91)

## 2014-10-15 LAB — RPR: RPR Ser Ql: NONREACTIVE

## 2014-10-15 LAB — HIV ANTIBODY (ROUTINE TESTING W REFLEX): HIV Screen 4th Generation wRfx: NONREACTIVE

## 2014-10-15 LAB — ANTIBODY SCREEN: Antibody Screen: NEGATIVE

## 2014-10-17 ENCOUNTER — Telehealth: Payer: Self-pay | Admitting: Women's Health

## 2014-10-17 DIAGNOSIS — O24419 Gestational diabetes mellitus in pregnancy, unspecified control: Secondary | ICD-10-CM

## 2014-10-18 ENCOUNTER — Telehealth: Payer: Self-pay | Admitting: Women's Health

## 2014-10-18 DIAGNOSIS — Z3492 Encounter for supervision of normal pregnancy, unspecified, second trimester: Secondary | ICD-10-CM

## 2014-10-18 DIAGNOSIS — O09212 Supervision of pregnancy with history of pre-term labor, second trimester: Secondary | ICD-10-CM

## 2014-10-18 NOTE — Telephone Encounter (Signed)
Pt aware of abnormal glucose tolerance. Pt wants to discuss plan with Dr. Glo Herring. Pt has a Hx of gestational diabetes and wants to discuss going ahead and starting everything. I advised the pt that I would send the message again to Dr. Glo Herring and they could figure out a plan. Pt verbalized understanding.

## 2014-10-19 NOTE — Telephone Encounter (Signed)
Pt to begin fasting and 1 hr PC cbg's , Pt has glucometer, and strips, Pt has begun walking daily. Current wt 140 . followup in office.

## 2014-10-20 NOTE — Telephone Encounter (Signed)
Patient called yesterday morning, and questions answered. Pt walking, pt checking cbg's fasting and 1 hr pc.

## 2014-10-25 ENCOUNTER — Telehealth: Payer: Self-pay | Admitting: *Deleted

## 2014-10-25 NOTE — Telephone Encounter (Signed)
Pt states Glucose tolerance testing abnormal what is normal blood sugars. Per Derrek Monaco, NP fasting < 100, non-fasting < 140. Pt encouraged to fresh fruits, vegetables verses process. Pt verbalized understanding.

## 2014-11-04 ENCOUNTER — Ambulatory Visit (INDEPENDENT_AMBULATORY_CARE_PROVIDER_SITE_OTHER): Payer: BC Managed Care – PPO | Admitting: Obstetrics and Gynecology

## 2014-11-04 ENCOUNTER — Encounter: Payer: Self-pay | Admitting: Obstetrics and Gynecology

## 2014-11-04 ENCOUNTER — Telehealth: Payer: Self-pay | Admitting: Obstetrics & Gynecology

## 2014-11-04 VITALS — BP 112/76 | HR 88

## 2014-11-04 DIAGNOSIS — Z1389 Encounter for screening for other disorder: Secondary | ICD-10-CM

## 2014-11-04 DIAGNOSIS — O26879 Cervical shortening, unspecified trimester: Secondary | ICD-10-CM

## 2014-11-04 DIAGNOSIS — O09899 Supervision of other high risk pregnancies, unspecified trimester: Secondary | ICD-10-CM | POA: Insufficient documentation

## 2014-11-04 DIAGNOSIS — O09893 Supervision of other high risk pregnancies, third trimester: Secondary | ICD-10-CM

## 2014-11-04 DIAGNOSIS — O09219 Supervision of pregnancy with history of pre-term labor, unspecified trimester: Secondary | ICD-10-CM

## 2014-11-04 DIAGNOSIS — Z331 Pregnant state, incidental: Secondary | ICD-10-CM

## 2014-11-04 DIAGNOSIS — O3403 Maternal care for unspecified congenital malformation of uterus, third trimester: Secondary | ICD-10-CM

## 2014-11-04 DIAGNOSIS — Z3492 Encounter for supervision of normal pregnancy, unspecified, second trimester: Secondary | ICD-10-CM

## 2014-11-04 DIAGNOSIS — Q514 Unicornate uterus: Secondary | ICD-10-CM

## 2014-11-04 DIAGNOSIS — O09213 Supervision of pregnancy with history of pre-term labor, third trimester: Secondary | ICD-10-CM

## 2014-11-04 LAB — POCT URINALYSIS DIPSTICK
Blood, UA: NEGATIVE
Glucose, UA: NEGATIVE
Ketones, UA: NEGATIVE
Leukocytes, UA: NEGATIVE
Nitrite, UA: NEGATIVE
Protein, UA: NEGATIVE

## 2014-11-04 MED ORDER — PROGESTERONE MICRONIZED 200 MG PO CAPS: 200.0000 mg | ORAL_CAPSULE | Freq: Every day | ORAL | Status: DC

## 2014-11-04 MED ORDER — BETAMETHASONE SOD PHOS & ACET 6 (3-3) MG/ML IJ SUSP
12.0000 mg | Freq: Once | INTRAMUSCULAR | Status: AC
  Administered 2014-11-04: 12 mg via INTRAMUSCULAR

## 2014-11-04 NOTE — Progress Notes (Signed)
Fetal Surveillance Testing today:  Cervix length u/s   High Risk Pregnancy Diagnosis(es):   Hx PTB@ 36 wk, Now Short cervix  [id number] [redacted]w[redacted]d Estimated Date of Delivery: 01/17/15  Blood pressure 112/76, pulse 88, last menstrual period 04/18/2014.  Urinalysis: Negative   HPI: The patient is being seen today for ongoing management of Hx unicornuate uterus, prior del at [place]. Today she reports increasing pelvic pressure, is a Pharmacist, hospital and quite active , esp next 3 wk   BP weight and urine results all reviewed and noted. Patient reports good fetal movement, denies any bleeding and no rupture of membranes symptoms or regular contractions.  Fundal Height:  29 Fetal Heart rate:  150 Cx: by u/s: by JVF. Short cervix, midposition, 1.6 cm length with no funnelling on valsalva. Edema:  -neg  Patient is without complaints other than noted in her HPI. All questions were answered.  All lab and sonogram results have been reviewed. Comments: Short cervix  Will tx prometrium  Assessment:  1.  Pregnancy at [redacted]w[redacted]d,  Estimated Date of Delivery: 01/17/15 :  Rx prometrium, BMZ                        2.  Breech presentation                        3.  Short cervix  Medication(s) Plans:  BMZ 12 mg im q 24 x 2,  Treatment Plan:  Add prometrium 200 mg PV hs.                             Out of work Weekly cervix length by u/s  Follow up in 1 weeks for appointment for high risk OB care, cervix length

## 2014-11-04 NOTE — Telephone Encounter (Signed)
Pt was actually seen by Dr. Glo Herring today and he took her out of work.

## 2014-11-04 NOTE — Progress Notes (Signed)
Pt worked in today for pelvic pain. Pt states that the pain is more on the right than the left. Pt states that the pain comes and goes, pain is a lot worse when pt is on her feet. Pt states that the pain will go away if she lays down and rests.

## 2014-11-05 ENCOUNTER — Inpatient Hospital Stay (HOSPITAL_COMMUNITY)
Admission: AD | Admit: 2014-11-05 | Discharge: 2014-11-05 | Disposition: A | Discharge status: Home / Self Care | Payer: BC Managed Care – PPO | Source: Ambulatory Visit | Attending: Obstetrics & Gynecology | Admitting: Obstetrics & Gynecology

## 2014-11-05 DIAGNOSIS — Z3A29 29 weeks gestation of pregnancy: Secondary | ICD-10-CM | POA: Diagnosis not present

## 2014-11-05 DIAGNOSIS — O26873 Cervical shortening, third trimester: Secondary | ICD-10-CM | POA: Diagnosis not present

## 2014-11-05 DIAGNOSIS — O321XX0 Maternal care for breech presentation, not applicable or unspecified: Secondary | ICD-10-CM | POA: Insufficient documentation

## 2014-11-05 MED ORDER — BETAMETHASONE SOD PHOS & ACET 6 (3-3) MG/ML IJ SUSP
12.0000 mg | INTRAMUSCULAR | Status: AC
  Administered 2014-11-05: 12 mg via INTRAMUSCULAR
  Filled 2014-11-05: qty 2

## 2014-11-08 ENCOUNTER — Telehealth: Payer: Self-pay | Admitting: *Deleted

## 2014-11-08 NOTE — Telephone Encounter (Signed)
Pt states she is have some indigestion and wanted to know what she could take.  Advised pt she could try Tums, Zantac OTC or Pepcid AC.  Pt verbalized understanding.

## 2014-11-08 NOTE — Telephone Encounter (Signed)
Pt states Chrystal called her today and left her a message to call us.  I do not see anything in the chart other then the note about the gestational diabetes, pt states she is aware of that and has already spoken with [place] about it and is checking her blood sugars.  She states she was taken out of work and put on bed rest by Dr. Glo Herring last week and she has an appointment to follow up here on Thursday with Dr. Elonda Husky.  I informed her that Chrystal is out of the office until Thursday but I would route he a message and if there was anything else she was trying to reach the pt about she would give her a call back then.  Pt verbalized understanding.

## 2014-11-08 NOTE — Telephone Encounter (Signed)
-----   Message from Florian Buff, MD sent at 10/26/2014  9:55 AM EDT ----- Please call pt and inform her she is gestational diabetic and please set up her diabetic consult  Thanks  Florian Buff, MD   ----- Message -----    From: [facility] Lab Results In Interface    Sent: 10/15/2014   7:45 AM      To: Florian Buff, MD

## 2014-11-09 ENCOUNTER — Telehealth: Payer: Self-pay | Admitting: *Deleted

## 2014-11-09 NOTE — Telephone Encounter (Signed)
Pt called stating that she had one episode of diarrhea and it worried her. Pt stated that she had good baby movement and denied any N&V and gush of fluid. Pt stated that she had some pressure in the shower but laid done and things got better. Pt stated that she has been pushing her fluids and resting. Pt stated that she has an appointment tomorrow with Dr. Elonda Husky. I spoke with Knute Neu and she advised that if the pt was having a lot of cramping and pressure she would need to be seen but if she had good baby movement and was not having the above she would be fin to wait until her appointment, just to make sure she pushes her fluids. I advised the pt of this and she verbalized understanding. Pt stated that she thought she could wait until her appointment she just wanted to make sure we thought it would be ok. I advised the pt that if she had any changes at all to call us back and we would work her in.

## 2014-11-10 ENCOUNTER — Ambulatory Visit (INDEPENDENT_AMBULATORY_CARE_PROVIDER_SITE_OTHER): Payer: BC Managed Care – PPO | Admitting: Obstetrics & Gynecology

## 2014-11-10 ENCOUNTER — Encounter: Payer: Self-pay | Admitting: Obstetrics & Gynecology

## 2014-11-10 VITALS — BP 104/70 | HR 84 | Wt 142.0 lb

## 2014-11-10 DIAGNOSIS — O0993 Supervision of high risk pregnancy, unspecified, third trimester: Secondary | ICD-10-CM

## 2014-11-10 DIAGNOSIS — Z1389 Encounter for screening for other disorder: Secondary | ICD-10-CM

## 2014-11-10 DIAGNOSIS — O26879 Cervical shortening, unspecified trimester: Secondary | ICD-10-CM

## 2014-11-10 DIAGNOSIS — Z331 Pregnant state, incidental: Secondary | ICD-10-CM

## 2014-11-10 LAB — POCT URINALYSIS DIPSTICK
Glucose, UA: 2
Ketones, UA: NEGATIVE
Leukocytes, UA: NEGATIVE
Nitrite, UA: NEGATIVE

## 2014-11-10 NOTE — Addendum Note (Signed)
Addended by: Doyne Keel on: 11/10/2014 04:39 PM   Modules accepted: Orders

## 2014-11-10 NOTE — Progress Notes (Signed)
Fetal Surveillance Testing today:  none   High Risk Pregnancy Diagnosis(es):   Short Cervix, unicornuate uterus, breech  [id number] [redacted]w[redacted]d Estimated Date of Delivery: 01/17/15  Blood pressure 104/70, pulse 84, weight 142 lb (64.411 kg), last menstrual period 04/18/2014.  Urinalysis: Negative   HPI: The patient is being seen today for ongoing management of short cervix. Today she reports pelvic pressure   BP weight and urine results all reviewed and noted. Patient reports good fetal movement, denies any bleeding and no rupture of membranes symptoms or regular contractions.  Fundal Height:  29 Fetal Heart rate:  145 Edema:  none  Patient is without complaints other than noted in her HPI. All questions were answered.  All lab and sonogram results have been reviewed. Comments:abnormal 1.6 cm cervical length  Assessment:  1.  Pregnancy at [redacted]w[redacted]d,  Estimated Date of Delivery: 01/17/15 :                          2.  Short cervix, history of PTB, s/p betamentasone                        3.    Medication(s) Plans:  No changes prometrium  Treatment Plan:  No changes  Follow up in 2 weeks for appointment for high risk OB care,

## 2014-11-17 ENCOUNTER — Telehealth: Payer: Self-pay | Admitting: Family Medicine

## 2014-11-17 NOTE — Telephone Encounter (Signed)
Enfamil premium or Enfamil gentle ease both of these are very reliable formulas would be a good idea to use that for the first 6 months after the first 6 months may switch to a lower cost formula if need be. Often coupons are available on line

## 2014-11-17 NOTE — Telephone Encounter (Signed)
Pt is expected and is wanting to know what kind of formula you would recommend.

## 2014-11-18 NOTE — Telephone Encounter (Signed)
Notified patient Enfamil premium or Enfamil gentle ease both of these are very reliable formulas would be a good idea to use that for the first 6 months after the first 6 months may switch to a lower cost formula if need be. Often coupons are available online. Patient verbalized understanding.

## 2014-11-23 ENCOUNTER — Telehealth: Payer: Self-pay | Admitting: *Deleted

## 2014-11-23 NOTE — Telephone Encounter (Signed)
Pt c/o right sided sharp pain, +FM, no vaginal, questionable gas. Pt states did have a BM yesterday. Pt informed to push fluids, cant take tylenol, rest if no improvement to call our office back. Pt informed can take gas-x if she feels it is gas. Pt verbalized understanding and states has her routine appt tomorrow.

## 2014-11-24 ENCOUNTER — Encounter: Payer: Self-pay | Admitting: Obstetrics & Gynecology

## 2014-11-24 ENCOUNTER — Ambulatory Visit (INDEPENDENT_AMBULATORY_CARE_PROVIDER_SITE_OTHER): Payer: BC Managed Care – PPO | Admitting: Obstetrics & Gynecology

## 2014-11-24 VITALS — BP 120/80 | HR 84 | Wt 146.4 lb

## 2014-11-24 DIAGNOSIS — O09893 Supervision of other high risk pregnancies, third trimester: Secondary | ICD-10-CM

## 2014-11-24 DIAGNOSIS — Q514 Unicornate uterus: Secondary | ICD-10-CM

## 2014-11-24 DIAGNOSIS — Z331 Pregnant state, incidental: Secondary | ICD-10-CM

## 2014-11-24 DIAGNOSIS — O3403 Maternal care for unspecified congenital malformation of uterus, third trimester: Secondary | ICD-10-CM

## 2014-11-24 DIAGNOSIS — Z029 Encounter for administrative examinations, unspecified: Secondary | ICD-10-CM

## 2014-11-24 DIAGNOSIS — Z1389 Encounter for screening for other disorder: Secondary | ICD-10-CM | POA: Diagnosis not present

## 2014-11-24 DIAGNOSIS — O09213 Supervision of pregnancy with history of pre-term labor, third trimester: Secondary | ICD-10-CM

## 2014-11-24 DIAGNOSIS — O24410 Gestational diabetes mellitus in pregnancy, diet controlled: Secondary | ICD-10-CM

## 2014-11-24 DIAGNOSIS — O26879 Cervical shortening, unspecified trimester: Secondary | ICD-10-CM

## 2014-11-24 DIAGNOSIS — O0993 Supervision of high risk pregnancy, unspecified, third trimester: Secondary | ICD-10-CM

## 2014-11-24 LAB — POCT URINALYSIS DIPSTICK
Blood, UA: 3
Ketones, UA: 2
Leukocytes, UA: NEGATIVE
Nitrite, UA: NEGATIVE

## 2014-11-24 MED ORDER — PROGESTERONE MICRONIZED 200 MG PO CAPS: 200.0000 mg | ORAL_CAPSULE | Freq: Every day | ORAL | Status: DC

## 2014-11-24 NOTE — Addendum Note (Signed)
Addended by: Florian Buff on: 11/24/2014 04:38 PM   Modules accepted: Orders

## 2014-11-24 NOTE — Addendum Note (Signed)
Addended by: Doyne Keel on: 11/24/2014 04:55 PM   Modules accepted: Orders

## 2014-11-24 NOTE — Progress Notes (Signed)
Fetal Surveillance Testing today:  None today   High Risk Pregnancy Diagnosis(es):   Class A1 DM, short cervix, unicornuate uterus with transverse lie, back up  [id number] [redacted]w[redacted]d Estimated Date of Delivery: 01/17/15  Blood pressure 120/80, pulse 84, weight 146 lb 6.4 oz (66.407 kg), last menstrual period 04/18/2014.  Urinalysis: Negative   HPI: The patient is being seen today for ongoing management of Class A1 DM, unicornuate uterus. Today she reports CBG fastings are a bit elevated at times, will watch   BP weight and urine results all reviewed and noted. Patient reports good fetal movement, denies any bleeding and no rupture of membranes symptoms or regular contractions.  Fundal Height:  31 Fetal Heart rate:  146 Edema:  none  Patient is without complaints other than noted in her HPI. All questions were answered.  All lab and sonogram results have been reviewed. Comments: abnormal: 2 hour GTT, short cervix   Assessment:  1.  Pregnancy at [redacted]w[redacted]d,  Estimated Date of Delivery: 01/17/15 :                          2.  Class A1 DM                        3.  Unicornuate uterus, back up trnasverse lie  Medication(s) Plans:  No glyburide yet  Treatment Plan:  2 weeks sonogram for EFW  Follow up in 2 weeks for appointment for high risk OB care, sonogram

## 2014-11-29 ENCOUNTER — Inpatient Hospital Stay (HOSPITAL_COMMUNITY)
Admission: AD | Admit: 2014-11-29 | Discharge: 2014-11-30 | Disposition: A | Discharge status: Home / Self Care | Payer: BC Managed Care – PPO | Source: Ambulatory Visit | Attending: Family Medicine | Admitting: Family Medicine

## 2014-11-29 ENCOUNTER — Encounter (HOSPITAL_COMMUNITY): Payer: Self-pay | Admitting: *Deleted

## 2014-11-29 ENCOUNTER — Inpatient Hospital Stay (HOSPITAL_COMMUNITY): Payer: BC Managed Care – PPO

## 2014-11-29 DIAGNOSIS — O09213 Supervision of pregnancy with history of pre-term labor, third trimester: Secondary | ICD-10-CM

## 2014-11-29 DIAGNOSIS — Z3A33 33 weeks gestation of pregnancy: Secondary | ICD-10-CM | POA: Insufficient documentation

## 2014-11-29 DIAGNOSIS — O24419 Gestational diabetes mellitus in pregnancy, unspecified control: Secondary | ICD-10-CM | POA: Insufficient documentation

## 2014-11-29 DIAGNOSIS — Z3492 Encounter for supervision of normal pregnancy, unspecified, second trimester: Secondary | ICD-10-CM

## 2014-11-29 DIAGNOSIS — O4700 False labor before 37 completed weeks of gestation, unspecified trimester: Secondary | ICD-10-CM | POA: Insufficient documentation

## 2014-11-29 DIAGNOSIS — O479 False labor, unspecified: Secondary | ICD-10-CM | POA: Insufficient documentation

## 2014-11-29 LAB — WET PREP, GENITAL
Clue Cells Wet Prep HPF POC: NONE SEEN
Trich, Wet Prep: NONE SEEN
Yeast Wet Prep HPF POC: NONE SEEN

## 2014-11-29 LAB — URINE MICROSCOPIC-ADD ON

## 2014-11-29 LAB — URINALYSIS, ROUTINE W REFLEX MICROSCOPIC
Bilirubin Urine: NEGATIVE
Glucose, UA: 100 mg/dL — AB
Ketones, ur: NEGATIVE mg/dL
Nitrite: NEGATIVE
Protein, ur: NEGATIVE mg/dL
Specific Gravity, Urine: 1.025 (ref 1.005–1.030)
Urobilinogen, UA: 0.2 mg/dL (ref 0.0–1.0)
pH: 6 (ref 5.0–8.0)

## 2014-11-29 LAB — FETAL FIBRONECTIN: Fetal Fibronectin: NEGATIVE

## 2014-11-29 MED ORDER — SODIUM CHLORIDE 0.9 % IV BOLUS (SEPSIS)
1000.0000 mL | Freq: Once | INTRAVENOUS | Status: AC
  Administered 2014-11-29: 1000 mL via INTRAVENOUS

## 2014-11-29 MED ORDER — PROMETHAZINE HCL 25 MG PO TABS
25.0000 mg | ORAL_TABLET | Freq: Once | ORAL | Status: AC
  Administered 2014-11-29: 25 mg via ORAL
  Filled 2014-11-29: qty 1

## 2014-11-29 MED ORDER — NIFEDIPINE 10 MG PO CAPS
10.0000 mg | ORAL_CAPSULE | ORAL | Status: AC | PRN
  Administered 2014-11-29 (×3): 10 mg via ORAL
  Filled 2014-11-29 (×3): qty 1

## 2014-11-29 NOTE — MAU Note (Addendum)
PT  SAYS SHE STARTED  HAVING  UC  AT 730PM-   VE IN OFFICE - CLOSED - BUT THINNED OUT.  HAS HAD BETAMETHASONE,  TAKES  PROGESTERONE   QDAY       DENIES HSV AND   MRSA  HAD   SPOTTING  AT 748PM.    [facility]

## 2014-11-29 NOTE — MAU Note (Signed)
Pt. States she has been contracting since 8pm. Pt. States she had spotting around 730pm. Uses Vaginal progesterone nightly. Pt. States she is contracting every 5 mins lasting over a minute. Last baby was born at 15 weeks.

## 2014-11-29 NOTE — MAU Provider Note (Signed)
History     CSN: [id number]  Arrival date and time: 11/29/14 2126   None     No chief complaint on file.  HPI Patient is 34 y.o. [id number] [redacted]w[redacted]d here with complaints of contraction since about 8:30 PM. Contractions are every five minutes and moderately painful. Pregnancy has been complicated by history of perterm delivery, unicornuate uterus and A1GDM. She is known to have short cervical length, last check at 29 weeks at was 1.6 cm. Patient is on nightly vaginal progesterone; last placed yesterday at 11 PM. She has also had betamethasone series at 29 weeks. She noticed some spotting earlier this evening but no significant bleeding and no LOF. Denies any vaginal discharge or irritation. Continues to feel regular fetal movement. She reports her BG has been well controlled with diet over the last two days. Last sugar 2 hours after lunch was 115.    OB History    Gravida Para Term Preterm AB TAB SAB Ectopic Multiple Living   3 1  1 1  1   1       Past Medical History  Diagnosis Date  . Klippel-Feil deformity     syrinx  . Asthma   . Hypertension   . Pregnant 05/31/2014  . Spotting 05/31/2014  . AMA (advanced maternal age) multigravida 35+ 05/31/2014    Past Surgical History  Procedure Laterality Date  . Cholecystectomy    . Dilation and curettage of uterus      Family History  Problem Relation Age of Onset  . Diabetes Paternal Grandfather   . Cancer Paternal Grandfather     lung  . Diabetes Paternal Grandmother   . Diabetes Maternal Grandmother   . Diabetes Maternal Grandfather   . Hypothyroidism Mother     History  Substance Use Topics  . Smoking status: Never Smoker   . Smokeless tobacco: Never Used  . Alcohol Use: No     Comment: occasional    Allergies:  Allergies  Allergen Reactions  . Biaxin [Clarithromycin] Hives  . Cefzil [Cefprozil] Nausea Only  . Ciprofloxacin Hives  . Hydrocodone Other (See Comments)    Causes patient to feel wired  . Percocet  [Oxycodone-Acetaminophen] Other (See Comments)    Causes patient to feel wired  . Sulfa Antibiotics Hives    Prescriptions prior to admission  Medication Sig Dispense Refill Last Dose  . Prenatal Vit-Fe Fumarate-FA (PRENATAL MULTIVITAMIN) TABS tablet Take 1 tablet by mouth at bedtime.   11/28/2014 at Unknown time  . progesterone (PROMETRIUM) 200 MG capsule Place 1 capsule (200 mg total) vaginally at bedtime. 30 capsule 4 11/28/2014 at Unknown time  . albuterol (PROVENTIL HFA;VENTOLIN HFA) 108 (90 BASE) MCG/ACT inhaler Inhale 2 puffs into the lungs every 6 (six) hours as needed for wheezing. 1 Inhaler 2 Rescue    Review of Systems  Constitutional: Negative for fever and chills.  Eyes: Negative for blurred vision and double vision.  Respiratory: Negative for shortness of breath.   Cardiovascular: Negative for chest pain and leg swelling.  Gastrointestinal: Negative for heartburn, nausea, vomiting, diarrhea and constipation.  Genitourinary: Negative for dysuria and urgency.       No contractions, vaginal bleeding, itching or discharge  Skin: Negative for itching and rash.  Neurological: Negative for dizziness and headaches.   Physical Exam   Blood pressure 120/78, pulse 85, temperature 98.3 F (36.8 C), temperature source Oral, resp. rate 18, height 4\' 11"  (1.499 m), weight 68.55 kg (151 lb 2 oz), last menstrual period 04/18/2014.  Physical Exam  Constitutional: She is oriented to person, place, and time. She appears well-developed and well-nourished.  HENT:  Head: Normocephalic and atraumatic.  Eyes: Conjunctivae are normal.  Neck: Normal range of motion.  Cardiovascular: Normal rate, regular rhythm, normal heart sounds and intact distal pulses.   No murmur heard. Respiratory: Effort normal and breath sounds normal. No respiratory distress. She has no wheezes.  GI: Soft. Bowel sounds are normal. She exhibits no distension. There is no tenderness.  Genitourinary:  External genitalia  normal. Vaginal vault with small amount of white discharge, no bleeding noted, cervix not visualized  Musculoskeletal: Normal range of motion. She exhibits no edema.  Neurological: She is alert and oriented to person, place, and time.  Skin: Skin is warm and dry.  Psychiatric: She has a normal mood and affect.   Toco: contractions, mild q2-5 minutes FHT: Cat I, baseline 150, + accels, - decels  SVE by Yvonne Kendall @ 2330. 1 cm, thick  MAU Course  Procedures  MDM 34 y.o. [id number] at [redacted]w[redacted]d presenting with preterm contractions and spotting. FHT reassuring. No bleeding seen on sterile speculum. Will get ultrasound to reassess cervical length given history of shortened cervix previously. Send urinalysis, GC/chlamydia, wet prep. FFN collected and held - given history of placing vaginal progesterone yesterday evening, likely to be invalid result. Will give procardia 10 mg q20 min x 3 and monitor for cessation of contractions.   Ultrasound with cervical length of 2.5, breech Urinalysis without sign of infection, + Hgb. Patient reports history of recurrent nephrolithiasis FFN: negative  Patient given procardia x 3 without improvement in contractions, patient feels they are getting stronger. Given terbutaline 25 mcg subQ and patient reports not feeling any more regular contractions. Monitoring with some uterine irritability but no contractions. Repeat cervical exam unchanged.   Assessment and Plan  A: 34 y.o. [id number] at [redacted]w[redacted]d with history of prior preterm delivery, shortened cervix and A1GDM, presenting with threatened preterm labor without cervical change. Contractions stopped after procardia x 3 and terbutaline x 1. Ultrasound with cervical length of 2.5 cm  P:  Discharge to home with preterm labor precautions Fetal kick count reinforced.  Call primary OB in the AM for close follow up   Patient history, exam, assessment and plan discussed with Yvonne Kendall, CNM   Governor Specking 11/29/2014, 10:23 PM

## 2014-11-30 ENCOUNTER — Telehealth: Payer: Self-pay | Admitting: *Deleted

## 2014-11-30 DIAGNOSIS — Z3A33 33 weeks gestation of pregnancy: Secondary | ICD-10-CM | POA: Insufficient documentation

## 2014-11-30 DIAGNOSIS — O4700 False labor before 37 completed weeks of gestation, unspecified trimester: Secondary | ICD-10-CM | POA: Insufficient documentation

## 2014-11-30 DIAGNOSIS — O479 False labor, unspecified: Secondary | ICD-10-CM | POA: Insufficient documentation

## 2014-11-30 LAB — GC/CHLAMYDIA PROBE AMP (~~LOC~~) NOT AT ARMC
Chlamydia: NEGATIVE
Neisseria Gonorrhea: NEGATIVE

## 2014-11-30 MED ORDER — TERBUTALINE SULFATE 1 MG/ML IJ SOLN
0.2500 mg | Freq: Once | INTRAMUSCULAR | Status: AC
  Administered 2014-11-30: 0.25 mg via SUBCUTANEOUS
  Filled 2014-11-30: qty 1

## 2014-11-30 NOTE — Telephone Encounter (Signed)
Pt states she called here this morning and left a message with Kendrick Fries for a nurse to call her back and no one has returned her call.  There was no record of phone call in pts chart.  Pt states she went to MAU yesterday with contractions and was given several doses of medications to stop contractions, she states they never stopped but did slow down so she was released to home but told to follow up with [place] ASAP.  Her next scheduled appointment is not until next week and they told her at the hospital that she was 1cm and closed.  Pt given appointment with Dr. Elonda Husky for tomorrow (6/16) @ 2:45.  Pt repeated back to me date and time and agrees to be here then.

## 2014-11-30 NOTE — Discharge Instructions (Signed)

## 2014-12-01 ENCOUNTER — Ambulatory Visit (INDEPENDENT_AMBULATORY_CARE_PROVIDER_SITE_OTHER): Payer: BC Managed Care – PPO | Admitting: Obstetrics & Gynecology

## 2014-12-01 ENCOUNTER — Telehealth: Payer: Self-pay | Admitting: *Deleted

## 2014-12-01 ENCOUNTER — Encounter: Payer: Self-pay | Admitting: Obstetrics & Gynecology

## 2014-12-01 VITALS — BP 128/80 | HR 88 | Wt 146.0 lb

## 2014-12-01 DIAGNOSIS — Z331 Pregnant state, incidental: Secondary | ICD-10-CM

## 2014-12-01 DIAGNOSIS — Z1389 Encounter for screening for other disorder: Secondary | ICD-10-CM

## 2014-12-01 DIAGNOSIS — O329XX1 Maternal care for malpresentation of fetus, unspecified, fetus 1: Secondary | ICD-10-CM

## 2014-12-01 DIAGNOSIS — O0993 Supervision of high risk pregnancy, unspecified, third trimester: Secondary | ICD-10-CM

## 2014-12-01 DIAGNOSIS — O26879 Cervical shortening, unspecified trimester: Secondary | ICD-10-CM

## 2014-12-01 DIAGNOSIS — O24410 Gestational diabetes mellitus in pregnancy, diet controlled: Secondary | ICD-10-CM

## 2014-12-01 DIAGNOSIS — O3403 Maternal care for unspecified congenital malformation of uterus, third trimester: Secondary | ICD-10-CM

## 2014-12-01 DIAGNOSIS — Q514 Unicornate uterus: Secondary | ICD-10-CM

## 2014-12-01 LAB — POCT URINALYSIS DIPSTICK
Ketones, UA: NEGATIVE
Leukocytes, UA: NEGATIVE
Nitrite, UA: NEGATIVE
Protein, UA: NEGATIVE

## 2014-12-01 MED ORDER — OMEPRAZOLE 20 MG PO CPDR: 20.0000 mg | DELAYED_RELEASE_CAPSULE | Freq: Every day | ORAL | Status: DC

## 2014-12-01 MED ORDER — NIFEDIPINE 10 MG PO CAPS: ORAL_CAPSULE | ORAL | Status: DC

## 2014-12-01 NOTE — Telephone Encounter (Signed)
[name], from [place], states unable to fill RX for Procardia prescribed by Dr. Elonda Husky today "can not find it anywhere" but can get for tomorrow. Dr. Elonda Husky has left the office for today, call transferred to Nigel Berthold, CNM.

## 2014-12-01 NOTE — Progress Notes (Signed)
Fetal Surveillance Testing today:  none   High Risk Pregnancy Diagnosis(es):   Short cervix, unicornuate cervix  [id number] [redacted]w[redacted]d Estimated Date of Delivery: 01/17/15  Blood pressure 128/80, pulse 88, weight 146 lb (66.225 kg), last menstrual period 04/18/2014.  Urinalysis: Negative   HPI: The patient is being seen today for ongoing management of short cervix and unicornuate contractions. Today she reports pressure cramps, went to MAU 2 nights ago   BP weight and urine results all reviewed and noted. Patient reports good fetal movement, denies any bleeding and no rupture of membranes symptoms or regular contractions.  Fundal Height:  34 Fetal Heart rate:  150 Edema:  none  Patient is without complaints other than noted in her HPI. All questions were answered.  All lab and sonogram results have been reviewed. Comments: abnormal: short cervix   Assessment:  1.  Pregnancy at [redacted]w[redacted]d,  Estimated Date of Delivery: 01/17/15 :                          2.  Short cervix,                         3.  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

## 2014-12-06 ENCOUNTER — Telehealth: Payer: Self-pay | Admitting: Women's Health

## 2014-12-06 NOTE — Telephone Encounter (Signed)
Pt states was having contractions yesterday and took three procardia throughout the day, contractions stopped last night and has not had any today.  This morning woke up with nausea that eases some after eating, pt states hasn't "felt great" all this week.  Pt reports good FM, no LOF and no bleeding, she has an appointment here on Thursday 6/23 for routine follow up.  Advised pt to push fluids, try adding lemon to water or sucking on lemon drops to help ease stomach and keep scheduled appt Thursday, if any changes call us back and we will get her in sooner.  Pt verbalized understanding.

## 2014-12-08 ENCOUNTER — Encounter: Payer: Self-pay | Admitting: Obstetrics & Gynecology

## 2014-12-08 ENCOUNTER — Ambulatory Visit (INDEPENDENT_AMBULATORY_CARE_PROVIDER_SITE_OTHER): Payer: BC Managed Care – PPO

## 2014-12-08 ENCOUNTER — Other Ambulatory Visit: Payer: Self-pay | Admitting: Obstetrics & Gynecology

## 2014-12-08 ENCOUNTER — Ambulatory Visit (INDEPENDENT_AMBULATORY_CARE_PROVIDER_SITE_OTHER): Payer: BC Managed Care – PPO | Admitting: Obstetrics & Gynecology

## 2014-12-08 VITALS — BP 100/80 | HR 80 | Wt 151.0 lb

## 2014-12-08 DIAGNOSIS — Z3493 Encounter for supervision of normal pregnancy, unspecified, third trimester: Secondary | ICD-10-CM

## 2014-12-08 DIAGNOSIS — Z1389 Encounter for screening for other disorder: Secondary | ICD-10-CM

## 2014-12-08 DIAGNOSIS — Q514 Unicornate uterus: Secondary | ICD-10-CM

## 2014-12-08 DIAGNOSIS — O365931 Maternal care for other known or suspected poor fetal growth, third trimester, fetus 1: Secondary | ICD-10-CM

## 2014-12-08 DIAGNOSIS — O24410 Gestational diabetes mellitus in pregnancy, diet controlled: Secondary | ICD-10-CM | POA: Diagnosis not present

## 2014-12-08 DIAGNOSIS — O24419 Gestational diabetes mellitus in pregnancy, unspecified control: Secondary | ICD-10-CM

## 2014-12-08 DIAGNOSIS — O09213 Supervision of pregnancy with history of pre-term labor, third trimester: Secondary | ICD-10-CM

## 2014-12-08 DIAGNOSIS — Z331 Pregnant state, incidental: Secondary | ICD-10-CM

## 2014-12-08 DIAGNOSIS — O3403 Maternal care for unspecified congenital malformation of uterus, third trimester: Secondary | ICD-10-CM

## 2014-12-08 DIAGNOSIS — O0993 Supervision of high risk pregnancy, unspecified, third trimester: Secondary | ICD-10-CM

## 2014-12-08 LAB — POCT URINALYSIS DIPSTICK
Blood, UA: NEGATIVE
Glucose, UA: 2
Leukocytes, UA: NEGATIVE
Nitrite, UA: NEGATIVE

## 2014-12-08 MED ORDER — GLYBURIDE 2.5 MG PO TABS: 2.5000 mg | ORAL_TABLET | Freq: Two times a day (BID) | ORAL | Status: DC

## 2014-12-08 NOTE — Progress Notes (Signed)
Fetal Surveillance Testing today:  sonogram   High Risk Pregnancy Diagnosis(es):   Class A2 DM, short cervix, unicornuate uterus, fetal malpresentation  [id number] [redacted]w[redacted]d Estimated Date of Delivery: 01/17/15  Blood pressure 100/80, pulse 80, weight 151 lb (68.493 kg), last menstrual period 04/18/2014.  Urinalysis: Negative   HPI: The patient is being seen today for ongoing management of as above. Today she reports pelvic pressure back ache   BP weight and urine results all reviewed and noted. Patient reports good fetal movement, denies any bleeding and no rupture of membranes symptoms or regular contractions.  Fundal Height:  35 Fetal Heart rate:  140 Edema:  none  Patient is without complaints other than noted in her HPI. All questions were answered.  All lab and sonogram results have been reviewed. Comments: normal   Assessment:  1.  Pregnancy at [redacted]w[redacted]d,  Estimated Date of Delivery: 01/17/15 :                          2.  Class A2 DM                        3.  Breech  Medication(s) Plans:  Cont glyburide 2.5 mg qhs  Treatment Plan:  Twice weekly surveillance  Follow up in 1/2 weeks for appointment for high risk OB care, NST

## 2014-12-08 NOTE — Progress Notes (Addendum)
[place] 34+2wks,efw 2198g 30.6%,BPD 29+1wks <2.3%,HC 29+3 <2.3%,AC 33+1WKS 21.7%,FL 33+3 19%,breech,afi 15.5,RI .56,.52,post pl gr 1,bilat adnexa wnl,ant fibroid 4.7 x 5.4 x 6.2cm,bpp 8/8

## 2014-12-12 ENCOUNTER — Ambulatory Visit (INDEPENDENT_AMBULATORY_CARE_PROVIDER_SITE_OTHER): Payer: BC Managed Care – PPO | Admitting: Obstetrics & Gynecology

## 2014-12-12 ENCOUNTER — Encounter: Payer: Self-pay | Admitting: Obstetrics & Gynecology

## 2014-12-12 VITALS — BP 120/80 | HR 92 | Wt 152.0 lb

## 2014-12-12 DIAGNOSIS — O24419 Gestational diabetes mellitus in pregnancy, unspecified control: Secondary | ICD-10-CM | POA: Diagnosis not present

## 2014-12-12 DIAGNOSIS — Z1389 Encounter for screening for other disorder: Secondary | ICD-10-CM

## 2014-12-12 DIAGNOSIS — O0993 Supervision of high risk pregnancy, unspecified, third trimester: Secondary | ICD-10-CM

## 2014-12-12 DIAGNOSIS — O3403 Maternal care for unspecified congenital malformation of uterus, third trimester: Secondary | ICD-10-CM

## 2014-12-12 DIAGNOSIS — Q514 Unicornate uterus: Secondary | ICD-10-CM

## 2014-12-12 DIAGNOSIS — Z331 Pregnant state, incidental: Secondary | ICD-10-CM

## 2014-12-12 LAB — POCT URINALYSIS DIPSTICK
Blood, UA: 1
Glucose, UA: 1
Leukocytes, UA: NEGATIVE
Nitrite, UA: NEGATIVE

## 2014-12-12 NOTE — Progress Notes (Signed)
Fetal Surveillance Testing today:  Reactive NST   High Risk Pregnancy Diagnosis(es):   Class now A2 DM(persistently elevated fastings mildly), unicornuate uterus  [id number] [redacted]w[redacted]d Estimated Date of Delivery: 01/17/15  Blood pressure 120/80, pulse 92, weight 152 lb (68.947 kg), last menstrual period 04/18/2014.  Urinalysis: Negative   HPI: The patient is being seen today for ongoing management of class A2 DM. Today she reports back and pelvic pressure irregular ctx   BP weight and urine results all reviewed and noted. Patient reports good fetal movement, denies any bleeding and no rupture of membranes symptoms or regular contractions.  Fundal Height:  34 Fetal Heart rate:  150 Edema:  none  Patient is without complaints other than noted in her HPI. All questions were answered.  All lab and sonogram results have been reviewed. Comments: abnormal: 2 hour gtt   Assessment:  1.  Pregnancy at [redacted]w[redacted]d,  Estimated Date of Delivery: 01/17/15 :                          2.  Class A2 DM                        3.  Unicornuate uterus Thursdady weeks for appointment for high risk OB care, NST

## 2014-12-15 ENCOUNTER — Encounter: Payer: Self-pay | Admitting: Obstetrics & Gynecology

## 2014-12-15 ENCOUNTER — Ambulatory Visit (INDEPENDENT_AMBULATORY_CARE_PROVIDER_SITE_OTHER): Payer: BC Managed Care – PPO | Admitting: Obstetrics & Gynecology

## 2014-12-15 ENCOUNTER — Encounter: Payer: BC Managed Care – PPO | Admitting: Obstetrics & Gynecology

## 2014-12-15 ENCOUNTER — Other Ambulatory Visit: Payer: BC Managed Care – PPO

## 2014-12-15 VITALS — BP 120/80 | HR 80 | Wt 153.0 lb

## 2014-12-15 DIAGNOSIS — O24419 Gestational diabetes mellitus in pregnancy, unspecified control: Secondary | ICD-10-CM

## 2014-12-15 DIAGNOSIS — Q514 Unicornate uterus: Secondary | ICD-10-CM

## 2014-12-15 DIAGNOSIS — Z1389 Encounter for screening for other disorder: Secondary | ICD-10-CM

## 2014-12-15 DIAGNOSIS — O0993 Supervision of high risk pregnancy, unspecified, third trimester: Secondary | ICD-10-CM

## 2014-12-15 DIAGNOSIS — O329XX1 Maternal care for malpresentation of fetus, unspecified, fetus 1: Secondary | ICD-10-CM

## 2014-12-15 DIAGNOSIS — O3403 Maternal care for unspecified congenital malformation of uterus, third trimester: Secondary | ICD-10-CM

## 2014-12-15 DIAGNOSIS — Z331 Pregnant state, incidental: Secondary | ICD-10-CM

## 2014-12-15 NOTE — Progress Notes (Signed)
Fetal Surveillance Testing today:  Reactive NST   High Risk Pregnancy Diagnosis(es):   Class A2 DM  [id number] [redacted]w[redacted]d Estimated Date of Delivery: 01/17/15  Blood pressure 120/80, pulse 80, weight 153 lb (69.4 kg), last menstrual period 04/18/2014.  Urinalysis: Negative   HPI: The patient is being seen today for ongoing management of class A2 DM. Today she reports back pressure   BP weight and urine results all reviewed and noted. Patient reports good fetal movement, denies any bleeding and no rupture of membranes symptoms or regular contractions.  Fundal Height:  34 Fetal Heart rate:  145 Edema:  trace  Patient is without complaints other than noted in her HPI. All questions were answered.  All lab and sonogram results have been reviewed. Comments: abnormal: 2 hour   Assessment:  1.  Pregnancy at [redacted]w[redacted]d,  Estimated Date of Delivery: 01/17/15 :                          2.  Class A2 DM                        3.  Unicornuate uterus with fetal malpresentation  Medication(s) Plans:  Continue glyburide 2.5 mg nightly  Treatment Plan:  Twice weekly testing  Follow up in tuesday weeks for appointment for high risk OB care, NST

## 2014-12-20 ENCOUNTER — Encounter: Payer: Self-pay | Admitting: Obstetrics & Gynecology

## 2014-12-20 ENCOUNTER — Other Ambulatory Visit: Payer: BC Managed Care – PPO

## 2014-12-20 ENCOUNTER — Ambulatory Visit (INDEPENDENT_AMBULATORY_CARE_PROVIDER_SITE_OTHER): Payer: BC Managed Care – PPO | Admitting: Obstetrics & Gynecology

## 2014-12-20 ENCOUNTER — Encounter: Payer: BC Managed Care – PPO | Admitting: Obstetrics & Gynecology

## 2014-12-20 VITALS — BP 110/80 | HR 88 | Wt 154.4 lb

## 2014-12-20 DIAGNOSIS — Z1389 Encounter for screening for other disorder: Secondary | ICD-10-CM

## 2014-12-20 DIAGNOSIS — O0993 Supervision of high risk pregnancy, unspecified, third trimester: Secondary | ICD-10-CM

## 2014-12-20 DIAGNOSIS — Q514 Unicornate uterus: Secondary | ICD-10-CM

## 2014-12-20 DIAGNOSIS — O24419 Gestational diabetes mellitus in pregnancy, unspecified control: Secondary | ICD-10-CM

## 2014-12-20 DIAGNOSIS — O3403 Maternal care for unspecified congenital malformation of uterus, third trimester: Secondary | ICD-10-CM

## 2014-12-20 DIAGNOSIS — Z331 Pregnant state, incidental: Secondary | ICD-10-CM

## 2014-12-20 LAB — POCT URINALYSIS DIPSTICK
Blood, UA: 2
Glucose, UA: NEGATIVE
Nitrite, UA: NEGATIVE
Protein, UA: NEGATIVE
Protein, UA: NEGATIVE

## 2014-12-20 NOTE — Progress Notes (Signed)
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  Urinalysis: Negative   HPI: The patient is being seen today for ongoing management of Class A2 DM. Today she reports CBG excellent   BP weight and urine results all reviewed and noted. Patient reports good fetal movement, denies any bleeding and no rupture of membranes symptoms or regular contractions.  Fundal Height:  35 Fetal Heart rate:  145 Edema:  none  Patient is without complaints other than noted in her HPI. All questions were answered.  All lab and sonogram results have been reviewed. Comments: abnormal: class A2 DM   Assessment:  1.  Pregnancy at [redacted]w[redacted]d,  Estimated Date of Delivery: 01/17/15 :                          2.  Class A2 DM                        3.  Unicornuate uterus with short cervix PTL  Medication(s) Plans:  Stop procardia unless wanta for comfort, stop prometrium, has had BMZ x 2  Treatment Plan:  No changes glyburide 2.5 mg qhs  Follow up in friday weeks for appointment for high risk OB care, nst

## 2014-12-21 ENCOUNTER — Telehealth: Payer: Self-pay | Admitting: *Deleted

## 2014-12-21 LAB — US OB FOLLOW UP

## 2014-12-21 NOTE — Telephone Encounter (Signed)
Left message can not have sushi

## 2014-12-22 ENCOUNTER — Ambulatory Visit (INDEPENDENT_AMBULATORY_CARE_PROVIDER_SITE_OTHER): Payer: BC Managed Care – PPO | Admitting: Obstetrics & Gynecology

## 2014-12-22 ENCOUNTER — Telehealth: Payer: Self-pay | Admitting: *Deleted

## 2014-12-22 ENCOUNTER — Encounter: Payer: Self-pay | Admitting: Obstetrics & Gynecology

## 2014-12-22 VITALS — BP 120/90 | HR 80 | Wt 153.4 lb

## 2014-12-22 DIAGNOSIS — Q514 Unicornate uterus: Secondary | ICD-10-CM

## 2014-12-22 DIAGNOSIS — O4703 False labor before 37 completed weeks of gestation, third trimester: Secondary | ICD-10-CM

## 2014-12-22 DIAGNOSIS — Z331 Pregnant state, incidental: Secondary | ICD-10-CM

## 2014-12-22 DIAGNOSIS — Z1389 Encounter for screening for other disorder: Secondary | ICD-10-CM

## 2014-12-22 DIAGNOSIS — O0993 Supervision of high risk pregnancy, unspecified, third trimester: Secondary | ICD-10-CM

## 2014-12-22 DIAGNOSIS — O329XX1 Maternal care for malpresentation of fetus, unspecified, fetus 1: Secondary | ICD-10-CM

## 2014-12-22 DIAGNOSIS — O3403 Maternal care for unspecified congenital malformation of uterus, third trimester: Secondary | ICD-10-CM

## 2014-12-22 DIAGNOSIS — Z369 Encounter for antenatal screening, unspecified: Secondary | ICD-10-CM

## 2014-12-22 DIAGNOSIS — Z3685 Encounter for antenatal screening for Streptococcus B: Secondary | ICD-10-CM

## 2014-12-22 DIAGNOSIS — O24419 Gestational diabetes mellitus in pregnancy, unspecified control: Secondary | ICD-10-CM

## 2014-12-22 LAB — POCT URINALYSIS DIPSTICK
Glucose, UA: NEGATIVE
Ketones, UA: NEGATIVE
Nitrite, UA: NEGATIVE
Protein, UA: NEGATIVE

## 2014-12-22 NOTE — Progress Notes (Signed)
[employer]  Patient presents with  . [name] risk pregnancy    c/c back pain/ contractions 10 minute a part/gbs/gc/chl.    Blood pressure 120/90, pulse 80, weight 153 lb 6.4 oz (69.582 kg), last menstrual period 04/18/2014.  No LOF + cervical mucous +FM  No bleeding  cx 2/50/-2  Early or prodromal labor vs false  Recommend local care for comfort, procardia prn for comfort, go to [facility] if progresses Otherwise keep appt tomorrow

## 2014-12-22 NOTE — Addendum Note (Signed)
Addended by: Doyne Keel on: 12/22/2014 11:58 AM   Modules accepted: Orders

## 2014-12-22 NOTE — Telephone Encounter (Signed)
Pt c/o lower back pain, diarrhea, contractions 10 minutes apart, really uncomfortable, vomiting. Per Manus Gunning pt to be worked in with Dr. Elonda Husky this am.

## 2014-12-23 ENCOUNTER — Encounter: Payer: BC Managed Care – PPO | Admitting: Obstetrics & Gynecology

## 2014-12-23 ENCOUNTER — Encounter: Payer: Self-pay | Admitting: Obstetrics & Gynecology

## 2014-12-23 ENCOUNTER — Ambulatory Visit (INDEPENDENT_AMBULATORY_CARE_PROVIDER_SITE_OTHER): Payer: BC Managed Care – PPO | Admitting: Obstetrics & Gynecology

## 2014-12-23 ENCOUNTER — Other Ambulatory Visit: Payer: BC Managed Care – PPO

## 2014-12-23 VITALS — BP 120/80 | HR 84 | Wt 158.0 lb

## 2014-12-23 DIAGNOSIS — Q514 Unicornate uterus: Secondary | ICD-10-CM

## 2014-12-23 DIAGNOSIS — O0993 Supervision of high risk pregnancy, unspecified, third trimester: Secondary | ICD-10-CM

## 2014-12-23 DIAGNOSIS — O24419 Gestational diabetes mellitus in pregnancy, unspecified control: Secondary | ICD-10-CM | POA: Diagnosis not present

## 2014-12-23 DIAGNOSIS — Z1389 Encounter for screening for other disorder: Secondary | ICD-10-CM

## 2014-12-23 DIAGNOSIS — Z331 Pregnant state, incidental: Secondary | ICD-10-CM

## 2014-12-23 DIAGNOSIS — O3403 Maternal care for unspecified congenital malformation of uterus, third trimester: Secondary | ICD-10-CM

## 2014-12-23 LAB — POCT URINALYSIS DIPSTICK
Blood, UA: 3
Glucose, UA: NEGATIVE
Ketones, UA: NEGATIVE
Leukocytes, UA: NEGATIVE
Nitrite, UA: NEGATIVE

## 2014-12-23 LAB — GC/CHLAMYDIA PROBE AMP
Chlamydia trachomatis, NAA: NEGATIVE
Neisseria gonorrhoeae by PCR: NEGATIVE

## 2014-12-23 NOTE — Addendum Note (Signed)
Addended by: Doyne Keel on: 12/23/2014 11:05 AM   Modules accepted: Orders

## 2014-12-23 NOTE — Progress Notes (Signed)
Fetal Surveillance Testing today:  Reactive NST   High Risk Pregnancy Diagnosis(es):   Class A2 DM, unicornuate uterus with fetal malpresentation  [id number] [redacted]w[redacted]d Estimated Date of Delivery: 01/17/15  Blood pressure 120/80, pulse 84, weight 158 lb (71.668 kg), last menstrual period 04/18/2014.  Urinalysis: Negative   HPI: The patient is being seen today for ongoing management of Class A2 DM. Today she reports contractions documented on toco   BP weight and urine results all reviewed and noted. Patient reports good fetal movement, denies any bleeding and no rupture of membranes symptoms or regular contractions.  Fundal Height:  35 Fetal Heart rate:  140 Edema:  trace  Patient is without complaints other than noted in her HPI. All questions were answered.  All lab and sonogram results have been reviewed. Comments: abnormal: 2hour   Assessment:  1.  Pregnancy at [redacted]w[redacted]d,  Estimated Date of Delivery: 01/17/15 :                          2.  Class A2 DM                        3.  Unicornuate uterus with fetal malpresentation  Medication(s) Plans:  Glyburide 2.5 ng qhs  Treatment Plan:  Twice weekly assessment  Follow up in monday weeks for appointment for high risk OB care, NST

## 2014-12-26 ENCOUNTER — Ambulatory Visit (INDEPENDENT_AMBULATORY_CARE_PROVIDER_SITE_OTHER): Payer: BC Managed Care – PPO | Admitting: Obstetrics & Gynecology

## 2014-12-26 ENCOUNTER — Encounter: Payer: Self-pay | Admitting: Obstetrics & Gynecology

## 2014-12-26 ENCOUNTER — Other Ambulatory Visit: Payer: BC Managed Care – PPO

## 2014-12-26 ENCOUNTER — Encounter: Payer: BC Managed Care – PPO | Admitting: Obstetrics & Gynecology

## 2014-12-26 VITALS — BP 140/90 | HR 100 | Wt 158.0 lb

## 2014-12-26 DIAGNOSIS — O3403 Maternal care for unspecified congenital malformation of uterus, third trimester: Secondary | ICD-10-CM

## 2014-12-26 DIAGNOSIS — O0993 Supervision of high risk pregnancy, unspecified, third trimester: Secondary | ICD-10-CM | POA: Diagnosis not present

## 2014-12-26 DIAGNOSIS — Q514 Unicornate uterus: Secondary | ICD-10-CM

## 2014-12-26 DIAGNOSIS — O24419 Gestational diabetes mellitus in pregnancy, unspecified control: Secondary | ICD-10-CM | POA: Diagnosis not present

## 2014-12-26 DIAGNOSIS — Z3A36 36 weeks gestation of pregnancy: Secondary | ICD-10-CM | POA: Diagnosis not present

## 2014-12-26 DIAGNOSIS — O133 Gestational [pregnancy-induced] hypertension without significant proteinuria, third trimester: Secondary | ICD-10-CM | POA: Diagnosis not present

## 2014-12-26 NOTE — Progress Notes (Signed)
Fetal Surveillance Testing today:  Reactive NST   High Risk Pregnancy Diagnosis(es):   Class B DM,  Unicornuate uterus breech presentation  [id number] [redacted]w[redacted]d Estimated Date of Delivery: 01/17/15  Blood pressure 130/90, pulse 100, weight 158 lb (71.668 kg), last menstrual period 04/18/2014.  Urinalysis: Negative   HPI: The patient is being seen today for ongoing management of class A2 DM. Today she reports CBG excellent on glyburide 2.5 mg qhs   BP weight and urine results all reviewed and noted. Patient reports good fetal movement, denies any bleeding and no rupture of membranes symptoms or regular contractions.  Fundal Height:  35 Fetal Heart rate:  145 Edema:  1+  Patient is without complaints other than noted in her HPI. All questions were answered. abnormal: BP now elevated All lab and sonogram results have been reviewed. Comments:    Assessment:  1.  Pregnancy at [redacted]w[redacted]d,  Estimated Date of Delivery: 01/17/15 :                          2.  Class A2 DM                        3.  Gestational Hypertension  Medication(s) Plans:  No changes  Treatment Plan:  Proceed with caesarean section on 12/28/2014 due to gesttaional hyperertension, new onset  Follow up in 1 weeks for appointment for high risk OB care, post op

## 2014-12-26 NOTE — Addendum Note (Signed)
Addended by: Moreen Fowler R on: 12/26/2014 03:04 PM   Modules accepted: Orders

## 2014-12-27 ENCOUNTER — Inpatient Hospital Stay (HOSPITAL_COMMUNITY)
Admission: AD | Admit: 2014-12-27 | Discharge: 2014-12-29 | DRG: 765 | Disposition: A | Discharge status: Home / Self Care | Payer: BC Managed Care – PPO | Source: Ambulatory Visit | Attending: Obstetrics & Gynecology | Admitting: Obstetrics & Gynecology

## 2014-12-27 ENCOUNTER — Inpatient Hospital Stay (HOSPITAL_COMMUNITY): Payer: BC Managed Care – PPO | Admitting: Anesthesiology

## 2014-12-27 ENCOUNTER — Encounter (HOSPITAL_COMMUNITY)
Admission: AD | Disposition: A | Discharge status: Home / Self Care | Payer: Self-pay | Source: Ambulatory Visit | Attending: Obstetrics & Gynecology

## 2014-12-27 ENCOUNTER — Encounter (HOSPITAL_COMMUNITY): Payer: Self-pay | Admitting: *Deleted

## 2014-12-27 DIAGNOSIS — O24429 Gestational diabetes mellitus in childbirth, unspecified control: Secondary | ICD-10-CM

## 2014-12-27 DIAGNOSIS — O133 Gestational [pregnancy-induced] hypertension without significant proteinuria, third trimester: Secondary | ICD-10-CM | POA: Diagnosis present

## 2014-12-27 DIAGNOSIS — O99824 Streptococcus B carrier state complicating childbirth: Secondary | ICD-10-CM | POA: Diagnosis present

## 2014-12-27 DIAGNOSIS — O3410 Maternal care for benign tumor of corpus uteri, unspecified trimester: Secondary | ICD-10-CM

## 2014-12-27 DIAGNOSIS — O321XX0 Maternal care for breech presentation, not applicable or unspecified: Secondary | ICD-10-CM | POA: Diagnosis present

## 2014-12-27 DIAGNOSIS — O0990 Supervision of high risk pregnancy, unspecified, unspecified trimester: Secondary | ICD-10-CM

## 2014-12-27 DIAGNOSIS — Z3A37 37 weeks gestation of pregnancy: Secondary | ICD-10-CM | POA: Diagnosis present

## 2014-12-27 DIAGNOSIS — D259 Leiomyoma of uterus, unspecified: Secondary | ICD-10-CM | POA: Diagnosis present

## 2014-12-27 DIAGNOSIS — O09523 Supervision of elderly multigravida, third trimester: Secondary | ICD-10-CM

## 2014-12-27 DIAGNOSIS — O169 Unspecified maternal hypertension, unspecified trimester: Secondary | ICD-10-CM | POA: Diagnosis present

## 2014-12-27 DIAGNOSIS — Q514 Unicornate uterus: Secondary | ICD-10-CM

## 2014-12-27 DIAGNOSIS — O328XX0 Maternal care for other malpresentation of fetus, not applicable or unspecified: Secondary | ICD-10-CM | POA: Diagnosis present

## 2014-12-27 DIAGNOSIS — Z98891 History of uterine scar from previous surgery: Secondary | ICD-10-CM

## 2014-12-27 DIAGNOSIS — O3403 Maternal care for unspecified congenital malformation of uterus, third trimester: Secondary | ICD-10-CM

## 2014-12-27 DIAGNOSIS — O24419 Gestational diabetes mellitus in pregnancy, unspecified control: Secondary | ICD-10-CM | POA: Diagnosis present

## 2014-12-27 LAB — TYPE AND SCREEN
ABO/RH(D): O POS
Antibody Screen: NEGATIVE

## 2014-12-27 LAB — CBC
HCT: 38.7 % (ref 36.0–46.0)
Hemoglobin: 13.1 g/dL (ref 12.0–15.0)
MCH: 30.8 pg (ref 26.0–34.0)
MCHC: 33.9 g/dL (ref 30.0–36.0)
MCV: 90.8 fL (ref 78.0–100.0)
Platelets: 253 10*3/uL (ref 150–400)
RBC: 4.26 MIL/uL (ref 3.87–5.11)
RDW: 13.7 % (ref 11.5–15.5)
WBC: 11.4 10*3/uL — ABNORMAL HIGH (ref 4.0–10.5)

## 2014-12-27 LAB — ABO/RH: ABO/RH(D): O POS

## 2014-12-27 LAB — CULTURE, BETA STREP (GROUP B ONLY): Strep Gp B Culture: POSITIVE — AB

## 2014-12-27 LAB — GLUCOSE, CAPILLARY
Glucose-Capillary: 81 mg/dL (ref 65–99)
Glucose-Capillary: 96 mg/dL (ref 65–99)

## 2014-12-27 SURGERY — Surgical Case
Anesthesia: Spinal | Site: Abdomen

## 2014-12-27 MED ORDER — FAMOTIDINE IN NACL 20-0.9 MG/50ML-% IV SOLN
20.0000 mg | Freq: Once | INTRAVENOUS | Status: AC
  Administered 2014-12-27: 20 mg via INTRAVENOUS
  Filled 2014-12-27: qty 50

## 2014-12-27 MED ORDER — OXYTOCIN 40 UNITS IN LACTATED RINGERS INFUSION - SIMPLE MED: 62.5000 mL/h | INTRAVENOUS | Status: AC

## 2014-12-27 MED ORDER — WITCH HAZEL-GLYCERIN EX PADS: 1.0000 "application " | MEDICATED_PAD | CUTANEOUS | Status: DC | PRN

## 2014-12-27 MED ORDER — NALOXONE HCL 1 MG/ML IJ SOLN: 1.0000 ug/kg/h | INTRAVENOUS | Status: DC | PRN

## 2014-12-27 MED ORDER — FENTANYL CITRATE (PF) 100 MCG/2ML IJ SOLN: 25.0000 ug | INTRAMUSCULAR | Status: DC | PRN

## 2014-12-27 MED ORDER — MEPERIDINE HCL 25 MG/ML IJ SOLN: 6.2500 mg | INTRAMUSCULAR | Status: DC | PRN

## 2014-12-27 MED ORDER — IBUPROFEN 600 MG PO TABS
600.0000 mg | ORAL_TABLET | Freq: Four times a day (QID) | ORAL | Status: DC
  Administered 2014-12-27 – 2014-12-28 (×5): 600 mg via ORAL
  Filled 2014-12-27 (×5): qty 1

## 2014-12-27 MED ORDER — MENTHOL 3 MG MT LOZG: 1.0000 | LOZENGE | OROMUCOSAL | Status: DC | PRN

## 2014-12-27 MED ORDER — MORPHINE SULFATE 0.5 MG/ML IJ SOLN
INTRAMUSCULAR | Status: AC
  Filled 2014-12-27: qty 10

## 2014-12-27 MED ORDER — TETANUS-DIPHTH-ACELL PERTUSSIS 5-2.5-18.5 LF-MCG/0.5 IM SUSP
0.5000 mL | Freq: Once | INTRAMUSCULAR | Status: AC
  Administered 2014-12-28: 0.5 mL via INTRAMUSCULAR

## 2014-12-27 MED ORDER — LACTATED RINGERS IV SOLN
INTRAVENOUS | Status: DC
  Administered 2014-12-27: 17:00:00 via INTRAVENOUS

## 2014-12-27 MED ORDER — SODIUM CHLORIDE 0.9 % IJ SOLN: 3.0000 mL | INTRAMUSCULAR | Status: DC | PRN

## 2014-12-27 MED ORDER — ONDANSETRON HCL 4 MG/2ML IJ SOLN
INTRAMUSCULAR | Status: DC | PRN
  Administered 2014-12-27: 4 mg via INTRAVENOUS

## 2014-12-27 MED ORDER — ERYTHROMYCIN 5 MG/GM OP OINT
TOPICAL_OINTMENT | OPHTHALMIC | Status: AC
  Filled 2014-12-27: qty 1

## 2014-12-27 MED ORDER — BUPIVACAINE HCL (PF) 0.5 % IJ SOLN
INTRAMUSCULAR | Status: DC | PRN
  Administered 2014-12-27: 30 mL

## 2014-12-27 MED ORDER — ACETAMINOPHEN 325 MG PO TABS: 650.0000 mg | ORAL_TABLET | ORAL | Status: DC | PRN

## 2014-12-27 MED ORDER — LACTATED RINGERS IV SOLN
INTRAVENOUS | Status: DC
  Administered 2014-12-27 (×2): via INTRAVENOUS

## 2014-12-27 MED ORDER — SIMETHICONE 80 MG PO CHEW
80.0000 mg | CHEWABLE_TABLET | ORAL | Status: DC
  Administered 2014-12-27 – 2014-12-28 (×2): 80 mg via ORAL
  Filled 2014-12-27 (×2): qty 1

## 2014-12-27 MED ORDER — DEXTROSE 5 % IV SOLN
2.0000 g | INTRAVENOUS | Status: AC
  Administered 2014-12-27: 2 g via INTRAVENOUS
  Filled 2014-12-27: qty 2

## 2014-12-27 MED ORDER — ZOLPIDEM TARTRATE 5 MG PO TABS: 5.0000 mg | ORAL_TABLET | Freq: Every evening | ORAL | Status: DC | PRN

## 2014-12-27 MED ORDER — SENNOSIDES-DOCUSATE SODIUM 8.6-50 MG PO TABS
2.0000 | ORAL_TABLET | ORAL | Status: DC
  Administered 2014-12-27 – 2014-12-28 (×2): 2 via ORAL
  Filled 2014-12-27 (×2): qty 2

## 2014-12-27 MED ORDER — NALOXONE HCL 0.4 MG/ML IJ SOLN: 0.4000 mg | INTRAMUSCULAR | Status: DC | PRN

## 2014-12-27 MED ORDER — SCOPOLAMINE 1 MG/3DAYS TD PT72
MEDICATED_PATCH | TRANSDERMAL | Status: DC | PRN
  Administered 2014-12-27: 1 via TRANSDERMAL

## 2014-12-27 MED ORDER — SCOPOLAMINE 1 MG/3DAYS TD PT72
MEDICATED_PATCH | TRANSDERMAL | Status: AC
  Filled 2014-12-27: qty 3

## 2014-12-27 MED ORDER — CITRIC ACID-SODIUM CITRATE 334-500 MG/5ML PO SOLN
30.0000 mL | Freq: Once | ORAL | Status: AC
  Administered 2014-12-27: 30 mL via ORAL
  Filled 2014-12-27: qty 15

## 2014-12-27 MED ORDER — NALBUPHINE HCL 10 MG/ML IJ SOLN: 5.0000 mg | Freq: Once | INTRAMUSCULAR | Status: AC | PRN

## 2014-12-27 MED ORDER — DIBUCAINE 1 % RE OINT: 1.0000 "application " | TOPICAL_OINTMENT | RECTAL | Status: DC | PRN

## 2014-12-27 MED ORDER — OXYTOCIN 10 UNIT/ML IJ SOLN
INTRAMUSCULAR | Status: AC
  Filled 2014-12-27: qty 4

## 2014-12-27 MED ORDER — PROMETHAZINE HCL 25 MG/ML IJ SOLN: 6.2500 mg | INTRAMUSCULAR | Status: DC | PRN

## 2014-12-27 MED ORDER — MORPHINE SULFATE (PF) 0.5 MG/ML IJ SOLN
INTRAMUSCULAR | Status: DC | PRN
  Administered 2014-12-27: .1 mg via INTRATHECAL

## 2014-12-27 MED ORDER — DIPHENHYDRAMINE HCL 50 MG/ML IJ SOLN: 12.5000 mg | INTRAMUSCULAR | Status: DC | PRN

## 2014-12-27 MED ORDER — MAGNESIUM HYDROXIDE 400 MG/5ML PO SUSP: 30.0000 mL | ORAL | Status: DC | PRN

## 2014-12-27 MED ORDER — NALBUPHINE HCL 10 MG/ML IJ SOLN: 5.0000 mg | INTRAMUSCULAR | Status: DC | PRN

## 2014-12-27 MED ORDER — SCOPOLAMINE 1 MG/3DAYS TD PT72
1.0000 | MEDICATED_PATCH | Freq: Once | TRANSDERMAL | Status: DC
  Filled 2014-12-27: qty 1

## 2014-12-27 MED ORDER — ONDANSETRON HCL 4 MG/2ML IJ SOLN: 4.0000 mg | Freq: Three times a day (TID) | INTRAMUSCULAR | Status: DC | PRN

## 2014-12-27 MED ORDER — TERBUTALINE SULFATE 1 MG/ML IJ SOLN
0.2500 mg | Freq: Once | INTRAMUSCULAR | Status: AC
  Administered 2014-12-27: 0.25 mg via SUBCUTANEOUS
  Filled 2014-12-27: qty 1

## 2014-12-27 MED ORDER — HYDROMORPHONE HCL 2 MG PO TABS
2.0000 mg | ORAL_TABLET | ORAL | Status: DC | PRN
  Administered 2014-12-27 – 2014-12-28 (×3): 2 mg via ORAL
  Administered 2014-12-28: 4 mg via ORAL
  Administered 2014-12-28 (×2): 2 mg via ORAL
  Administered 2014-12-28 – 2014-12-29 (×5): 4 mg via ORAL
  Administered 2014-12-29: 2 mg via ORAL
  Filled 2014-12-27: qty 1
  Filled 2014-12-27 (×2): qty 2
  Filled 2014-12-27 (×4): qty 1
  Filled 2014-12-27: qty 2
  Filled 2014-12-27 (×3): qty 1
  Filled 2014-12-27 (×2): qty 2

## 2014-12-27 MED ORDER — PRENATAL MULTIVITAMIN CH
1.0000 | ORAL_TABLET | Freq: Every day | ORAL | Status: DC
  Administered 2014-12-28 – 2014-12-29 (×2): 1 via ORAL
  Filled 2014-12-27 (×2): qty 1

## 2014-12-27 MED ORDER — PHENYLEPHRINE 8 MG IN D5W 100 ML (0.08MG/ML) PREMIX OPTIME
INJECTION | INTRAVENOUS | Status: DC | PRN
  Administered 2014-12-27: 60 ug/min via INTRAVENOUS

## 2014-12-27 MED ORDER — FENTANYL CITRATE (PF) 100 MCG/2ML IJ SOLN
INTRAMUSCULAR | Status: AC
Start: 2014-12-27 — End: 2014-12-27
  Filled 2014-12-27: qty 2

## 2014-12-27 MED ORDER — MEASLES, MUMPS & RUBELLA VAC ~~LOC~~ INJ
0.5000 mL | INJECTION | Freq: Once | SUBCUTANEOUS | Status: DC
Start: 2014-12-28 — End: 2014-12-29
  Filled 2014-12-27: qty 0.5

## 2014-12-27 MED ORDER — DIPHENHYDRAMINE HCL 25 MG PO CAPS: 25.0000 mg | ORAL_CAPSULE | ORAL | Status: DC | PRN

## 2014-12-27 MED ORDER — VITAMIN K1 1 MG/0.5ML IJ SOLN
INTRAMUSCULAR | Status: AC
  Filled 2014-12-27: qty 0.5

## 2014-12-27 MED ORDER — SIMETHICONE 80 MG PO CHEW: 80.0000 mg | CHEWABLE_TABLET | ORAL | Status: DC | PRN

## 2014-12-27 MED ORDER — OXYTOCIN 10 UNIT/ML IJ SOLN
40.0000 [IU] | INTRAVENOUS | Status: DC | PRN
  Administered 2014-12-27: 40 [IU] via INTRAVENOUS

## 2014-12-27 MED ORDER — LANOLIN HYDROUS EX OINT: 1.0000 "application " | TOPICAL_OINTMENT | CUTANEOUS | Status: DC | PRN

## 2014-12-27 MED ORDER — ONDANSETRON HCL 4 MG/2ML IJ SOLN
INTRAMUSCULAR | Status: AC
  Filled 2014-12-27: qty 2

## 2014-12-27 MED ORDER — FENTANYL CITRATE (PF) 100 MCG/2ML IJ SOLN
INTRAMUSCULAR | Status: DC | PRN
  Administered 2014-12-27: 25 ug via INTRATHECAL

## 2014-12-27 MED ORDER — HYDROMORPHONE HCL 1 MG/ML IJ SOLN: 1.0000 mg | INTRAMUSCULAR | Status: DC | PRN

## 2014-12-27 MED ORDER — PHENYLEPHRINE 8 MG IN D5W 100 ML (0.08MG/ML) PREMIX OPTIME
INJECTION | INTRAVENOUS | Status: AC
  Filled 2014-12-27: qty 200

## 2014-12-27 MED ORDER — KETOROLAC TROMETHAMINE 30 MG/ML IJ SOLN
INTRAMUSCULAR | Status: AC
  Administered 2014-12-27: 30 mg via INTRAMUSCULAR
  Filled 2014-12-27: qty 1

## 2014-12-27 MED ORDER — DIPHENHYDRAMINE HCL 25 MG PO CAPS: 25.0000 mg | ORAL_CAPSULE | Freq: Four times a day (QID) | ORAL | Status: DC | PRN

## 2014-12-27 SURGICAL SUPPLY — 33 items
CLAMP CORD UMBIL (MISCELLANEOUS) IMPLANT
CLOTH BEACON ORANGE TIMEOUT ST (SAFETY) ×3 IMPLANT
DRAPE SHEET LG 3/4 BI-LAMINATE (DRAPES) IMPLANT
DRSG OPSITE POSTOP 4X10 (GAUZE/BANDAGES/DRESSINGS) ×3 IMPLANT
DURAPREP 26ML APPLICATOR (WOUND CARE) ×3 IMPLANT
ELECT REM PT RETURN 9FT ADLT (ELECTROSURGICAL) ×3
ELECTRODE REM PT RTRN 9FT ADLT (ELECTROSURGICAL) ×1 IMPLANT
EXTRACTOR VACUUM M CUP 4 TUBE (SUCTIONS) IMPLANT
EXTRACTOR VACUUM M CUP 4' TUBE (SUCTIONS)
GLOVE BIOGEL PI IND STRL 7.0 (GLOVE) ×2 IMPLANT
GLOVE BIOGEL PI INDICATOR 7.0 (GLOVE) ×4
GLOVE ECLIPSE 7.0 STRL STRAW (GLOVE) ×3 IMPLANT
GOWN STRL REUS W/TWL LRG LVL3 (GOWN DISPOSABLE) ×6 IMPLANT
HEMOSTAT SURGICEL 2X3 (HEMOSTASIS) ×3 IMPLANT
HEMOSTAT SURGICEL 2X4 FIBR (HEMOSTASIS) IMPLANT
KIT ABG SYR 3ML LUER SLIP (SYRINGE) IMPLANT
NEEDLE HYPO 22GX1.5 SAFETY (NEEDLE) ×3 IMPLANT
NEEDLE HYPO 25X5/8 SAFETYGLIDE (NEEDLE) ×3 IMPLANT
NS IRRIG 1000ML POUR BTL (IV SOLUTION) ×3 IMPLANT
PACK C SECTION WH (CUSTOM PROCEDURE TRAY) ×3 IMPLANT
PAD ABD 7.5X8 STRL (GAUZE/BANDAGES/DRESSINGS) ×3 IMPLANT
PAD OB MATERNITY 4.3X12.25 (PERSONAL CARE ITEMS) ×3 IMPLANT
RTRCTR C-SECT PINK 25CM LRG (MISCELLANEOUS) ×3 IMPLANT
SUT PDS AB 0 CT1 27 (SUTURE) IMPLANT
SUT PDS AB 0 CTX 36 PDP370T (SUTURE) IMPLANT
SUT PLAIN 2 0 XLH (SUTURE) IMPLANT
SUT VIC AB 0 CT1 36 (SUTURE) ×6 IMPLANT
SUT VIC AB 0 CTX 36 (SUTURE) ×9
SUT VIC AB 0 CTX36XBRD ANBCTRL (SUTURE) ×3 IMPLANT
SUT VIC AB 4-0 KS 27 (SUTURE) ×3 IMPLANT
SYR CONTROL 10ML LL (SYRINGE) ×3 IMPLANT
TOWEL OR 17X24 6PK STRL BLUE (TOWEL DISPOSABLE) ×3 IMPLANT
TRAY FOLEY CATH SILVER 14FR (SET/KITS/TRAYS/PACK) ×3 IMPLANT

## 2014-12-27 NOTE — Transfer of Care (Signed)
Immediate Anesthesia Transfer of Care Note  Patient: [name]  Procedure(s) Performed: Procedure(s): CESAREAN SECTION (N/A)  Patient Location: PACU  Anesthesia Type:Spinal  Level of Consciousness: awake, alert  and oriented  Airway & Oxygen Therapy: Patient Spontanous Breathing  Post-op Assessment: Report given to RN and Post -op Vital signs reviewed and stable  Post vital signs: Reviewed and stable  Last Vitals:  Filed Vitals:   12/27/14 0533  BP: 132/89  Pulse: 84  Temp:   Resp:     Complications: No apparent anesthesia complications

## 2014-12-27 NOTE — Op Note (Signed)
[name] PROCEDURE DATE: 12/27/2014  PREOPERATIVE DIAGNOSES: Intrauterine pregnancy at [redacted]w[redacted]d weeks gestation; malpresentation: footling breech presentation; unicornuate uterus; gestational diabetes; gestational hypertension  POSTOPERATIVE DIAGNOSES: The same  PROCEDURE: Primary Low Transverse Cesarean Section  SURGEON:  Dr. Verita Schneiders  ANESTHESIOLOGIST: Dr. Lyndle Herrlich  INDICATIONS: [name] is a 34 y.o. [id number] at [redacted]w[redacted]d here for cesarean section secondary to the indications listed under preoperative diagnoses; please see preoperative note for further details.  The risks of cesarean section were discussed with the patient including but were not limited to: bleeding which may require transfusion or reoperation; infection which may require antibiotics; injury to bowel, bladder, ureters or other surrounding organs; injury to the fetus; need for additional procedures including hysterectomy in the event of a life-threatening hemorrhage; placental abnormalities wth subsequent pregnancies, incisional problems, thromboembolic phenomenon and other postoperative/anesthesia complications.   The patient concurred with the proposed plan, giving informed written consent for the procedure.    FINDINGS:  Viable female infant in footling breech presentation.  Apgars 9 and 9, weight 6 lbs.  Clear amniotic fluid.  Intact placenta, three vessel cord.  Unicornuate uterus, normal adnexa.  ANESTHESIA: Spinal INTRAVENOUS FLUIDS: 2300 ml ESTIMATED BLOOD LOSS: 700 ml URINE OUTPUT:  350 ml SPECIMENS: Placenta sent to pathology COMPLICATIONS: None immediate  PROCEDURE IN DETAIL:  The patient preoperatively received intravenous antibiotics and had sequential compression devices applied to her lower extremities.  She was then taken to the operating room where spinal anesthesia was administered and was found to be adequate. She was then placed in a dorsal supine position with a leftward tilt, and  prepped and draped in a sterile manner.  A foley catheter was placed into her bladder and attached to constant gravity.  After an adequate timeout was performed, a Pfannenstiel skin incision was made with scalpel and carried through to the underlying layer of fascia. The fascia was incised in the midline, and this incision was extended bilaterally using the Mayo scissors.  Kocher clamps were applied to the superior aspect of the fascial incision and the underlying rectus muscles were dissected off bluntly. A similar process was carried out on the inferior aspect of the fascial incision. The rectus muscles were separated in the midline bluntly and the peritoneum was entered bluntly. Attention was turned to the lower uterine segment where a low transverse hysterotomy was made with a scalpel and extended bilaterally bluntly.  The infant was successfully delivered in breech presentation, the cord was clamped and cut after one minute and the infant was handed over to awaiting neonatology team. Uterine massage was then administered, and the placenta delivered intact with a three-vessel cord. The uterus was then cleared of clot and debris.  The hysterotomy was closed with 0 Vicryl in a running locked fashion, and an imbricating layer was also placed with 0 Vicryl. Figure-of-eight serosal stitches were placed to help with hemostasis.  The pelvis was cleared of all clot and debris. Hemostasis was confirmed on all surfaces.  The peritoneum and the muscles were reapproximated using 0 Vicryl interrupted stitches. The fascia was then closed using 0 Vicryl in a running fashion.  The subcutaneous layer was irrigated, and 30 ml of 0.5% Marcaine was injected subcutaneously around the incision.  The skin was closed with a 4-0 Vicryl subcuticular stitch. The patient tolerated the procedure well. Sponge, lap, instrument and needle counts were correct x 3.  She was taken to the recovery room in stable condition.    Verita Schneiders,  MD, [place] Attending [place], [place]

## 2014-12-27 NOTE — Anesthesia Procedure Notes (Signed)
Spinal Patient location during procedure: OR Preanesthetic Checklist Completed: patient identified, site marked, surgical consent, pre-op evaluation, timeout performed, IV checked, risks and benefits discussed and monitors and equipment checked Spinal Block Patient position: sitting Prep: DuraPrep Patient monitoring: heart rate, cardiac monitor, continuous pulse ox and blood pressure Approach: midline Location: L3-4 Injection technique: single-shot Needle Needle type: Sprotte  Needle gauge: 24 G Needle length: 9 cm Assessment Sensory level: T4 Additional Notes Marked scoliosis.......Marland KitchenAttempted SAB; switch to CSE with LOR at 4 cm  Spinal Dosage in OR  Bupivicaine ml       1.1 PFMS04   mcg        100 Fentanyl mcg            25

## 2014-12-27 NOTE — MAU Note (Signed)
Pt is here with c/o ROM at 0420, breech presentation, contractions since water broke. Hx of fast labor with first baby.

## 2014-12-27 NOTE — Anesthesia Postprocedure Evaluation (Signed)
  Anesthesia Post-op Note  Patient: [name]  Procedure(s) Performed: Procedure(s): CESAREAN SECTION (N/A)  Patient Location: PACU  Anesthesia Type:Spinal  Level of Consciousness: awake, alert  and oriented  Airway and Oxygen Therapy: Patient Spontanous Breathing  Post-op Pain: none  Post-op Assessment: Post-op Vital signs reviewed, Patient's Cardiovascular Status Stable, Respiratory Function Stable, Patent Airway and No signs of Nausea or vomiting              Post-op Vital Signs: Reviewed and stable  Last Vitals:  Filed Vitals:   12/27/14 0533  BP: 132/89  Pulse: 84  Temp:   Resp:     Complications: No apparent anesthesia complications

## 2014-12-27 NOTE — Anesthesia Preprocedure Evaluation (Addendum)
Anesthesia Evaluation  Patient identified by MRN, date of birth, ID band Patient awake    Reviewed: Allergy & Precautions, H&P , Patient's Chart, lab work & pertinent test results  Airway Mallampati: II  TM Distance: >3 FB Neck ROM: full    Dental no notable dental hx.    Pulmonary asthma ,  breath sounds clear to auscultation  Pulmonary exam normal       Cardiovascular Exercise Tolerance: Good hypertension, Rhythm:regular Rate:Normal     Neuro/Psych    GI/Hepatic   Endo/Other    Renal/GU      Musculoskeletal   Abdominal   Peds  Hematology   Anesthesia Other Findings Klippel-Feil deformity  Syrinx     No Neurologic deficit Asthma    Hypertension               Reproductive/Obstetrics                            Anesthesia Physical Anesthesia Plan  ASA: II  Anesthesia Plan: Spinal   Post-op Pain Management:    Induction:   Airway Management Planned:   Additional Equipment:   Intra-op Plan:   Post-operative Plan:   Informed Consent: I have reviewed the patients History and Physical, chart, labs and discussed the procedure including the risks, benefits and alternatives for the proposed anesthesia with the patient or authorized representative who has indicated his/her understanding and acceptance.   Dental Advisory Given  Plan Discussed with: CRNA  Anesthesia Plan Comments: (Lab work confirmed with CRNA in room. Platelets okay. Discussed spinal anesthetic, and patient consents to the procedure:  included risk of possible headache,backache, failed block, allergic reaction, and nerve injury. This patient was asked if she had any questions or concerns before the procedure started. )        Anesthesia Quick Evaluation

## 2014-12-27 NOTE — Anesthesia Postprocedure Evaluation (Signed)
Anesthesia Post Note  Patient: [name]  Procedure(s) Performed: Procedure(s) (LRB): CESAREAN SECTION (N/A)  Anesthesia type: Epidural  Patient location: Mother/Baby  Post pain: Pain level controlled  Post assessment: Post-op Vital signs reviewed  Last Vitals:  Filed Vitals:   12/27/14 1230  BP:   Pulse:   Temp: 36.8 C  Resp: 18    Post vital signs: Reviewed  Level of consciousness:alert  Complications: No apparent anesthesia complications

## 2014-12-27 NOTE — Addendum Note (Signed)
Addendum  created 12/27/14 1455 by Asher Muir, CRNA   Modules edited: Notes Section   Notes Section:  File: [id number]

## 2014-12-27 NOTE — Lactation Note (Signed)
This note was copied from the chart of [name]. Lactation Consultation Note Initial visit at 32 hours of age.  Baby has been checked for low blood sugar.  Mom is holding baby STS and recovering from c/s.  Baby is 5#15oz.  Mom has DEBP with 2 sets in her room already, previously set up by RN.   Previously fed by spoon 60mls of colostrum per chart.  Baby is not latching to breast at this time.  14mls of colostrum expressed and spoon fed to baby and an additional 5-53mls of formula 22 cal.  Baby is satisfied with this feeding.  Lab drew blood, then baby STS with more rooting and brief latching on left breast.  Nipple semiflat, but breast is compressible. [facility] LC resources given and discussed.  Encouraged to feed with early cues on demand. Mom to wake baby for feedings every 2 1/2-3 hours.  Plans to prepump with hand pump.  Mom will attempt feedings and limit to 15-20 minutes and offer supplement of EBM or formula per guidelines for hours of age.  Encouraged pumping both breasts after feedings for 15 minutes.   Early newborn behavior discussed.  Hand expression demonstrated with colostrum visible.  Mom to call for assist as needed. Report given to [facility] RN at bedside.      Patient Name: [name] DTOIZ'T Date: 12/27/2014 Reason for consult: Initial assessment;Infant < 6lbs;Difficult latch;Other (Comment) (hypoglycemic)   Maternal Data Has patient been taught Hand Expression?: Yes Does the patient have breastfeeding experience prior to this delivery?: No  Feeding Feeding Type: Breast Fed  LATCH Score/Interventions Latch: Too sleepy or reluctant, no latch achieved, no sucking elicited.  Audible Swallowing: None  Type of Nipple: Flat  Comfort (Breast/Nipple): Soft / non-tender     Hold (Positioning): Assistance needed to correctly position infant at breast and maintain latch. Intervention(s): Skin to skin;Position options;Support Pillows;Breastfeeding basics  reviewed  LATCH Score: 4  Lactation Tools Discussed/Used Tools: Shells;Pump Breast pump type: Manual Initiated by:: RN Date initiated:: 12/27/14   Consult Status Consult Status: Follow-up Date: 12/28/14 Follow-up type: In-patient    [name], [name] 12/27/2014, 9:00 PM

## 2014-12-27 NOTE — H&P (Signed)
Obstetric Preoperative History and Physical  [name] is a 34 y.o. [id number] with IUP at [redacted]w[redacted]d presenting this morning after SROM at 0420 and frequent contractions.  She has a known persistent fetal breech presentation in the setting of a unicornuate uterus, also has gestational diabetes and hypertension in pregnancy.  Was scheduled for cesarean section on 12/28/14.  Denies any bleeding, endorses good fetal movement. No other acute concerns.    Prenatal Course Source of Care: [facility] Pregnancy complications or risks: Patient Active Problem List   Diagnosis Date Noted  . Breech presentation, single footling 12/27/2014  . Hypertension affecting pregnancy, antepartum 12/27/2014  . Gestational diabetes mellitus, antepartum 12/27/2014  . 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 Short cervix affecting pregnancy 11/04/2014  . Hx of preterm delivery, currently pregnant 11/04/2014  . ASCUS pap  07/04/2014  . Supervision of high-risk pregnancy 06/29/2014  . Uterine fibroid during pregnancy, antepartum 06/29/2014  . Previous preterm delivery, antepartum 06/29/2014  . Spotting 05/31/2014  . AMA (advanced maternal age) multigravida 35+ 05/31/2014  . Acute upper respiratory infections of unspecified site 09/07/2012  . Asthma   . Hypertension   She plans to breastfeed. She is undecided about postpartum contraception.   Prenatal labs and studies: ABO, Rh: O/POS/-- (01/13 1625) Antibody: Negative (04/29 0852) Rubella: 1.30 (01/13 1625) RPR: Non Reactive (04/29 0852)  HBsAg: NEGATIVE (01/13 1625)  HIV: NONREACTIVE (01/13 1625)  GBS: POSITIVE (07/07) 2 hr Glucola  91,215, 170 Genetic screening Declined Anatomy US normal   Past Medical History  Diagnosis Date  . Klippel-Feil deformity     syrinx  . Asthma   . Hypertension   . Pregnant 05/31/2014  . Spotting 05/31/2014  . AMA (advanced maternal age) multigravida 35+ 05/31/2014     Past Surgical History  Procedure Laterality Date  . Cholecystectomy    . Dilation and curettage of uterus      OB History  Gravida Para Term Preterm AB SAB TAB Ectopic Multiple Living  3 1  1 1 1    1     # Outcome Date GA Lbr Len/2nd Weight Sex Delivery Anes PTL Lv  3 Current           2 Preterm 11/25/09 [redacted]w[redacted]d  5 lb 9 oz (2.523 kg) F Vag-Spont EPI Y Y  1 SAB  [redacted]w[redacted]d             History   Social History  . Marital Status: Married    Spouse Name: N/A  . Number of Children: N/A  . Years of Education: N/A   Social History Main Topics  . Smoking status: Never Smoker   . Smokeless tobacco: Never Used  . Alcohol Use: No     Comment: occasional  . Drug Use: No  . Sexual Activity: Not Currently    Birth Control/ Protection: None   Other Topics Concern  . Not on file   Social History Narrative    Family History  Problem Relation Age of Onset  . Diabetes Paternal Grandfather   . Cancer Paternal Grandfather     lung  . Diabetes Paternal Grandmother   . Diabetes Maternal Grandmother   . Diabetes Maternal Grandfather   . Hypothyroidism Mother     Prescriptions prior to admission  Medication Sig Dispense Refill Last Dose  . albuterol (PROVENTIL HFA;VENTOLIN HFA) 108 (90 BASE) MCG/ACT inhaler Inhale 2 puffs into the lungs every 6 (six) hours as needed for wheezing. 1 Inhaler  2 rescue  . EPINEPHrine 0.3 mg/0.3 mL IJ SOAJ injection Inject 0.3 mg into the muscle once.   rescue  . glyBURIDE (DIABETA) 2.5 MG tablet Take 1 tablet (2.5 mg total) by mouth 2 (two) times daily with a meal. 1 tablet at bedtime (Patient taking differently: Take 2.5 mg by mouth at bedtime. 1 tablet at bedtime) 30 tablet 3 12/25/2014 at Unknown time  . NIFEdipine (PROCARDIA) 10 MG capsule 1 TID prn contractions (Patient not taking: Reported on 12/22/2014) 30 capsule 1 Completed Course at Unknown time  . omeprazole (PRILOSEC) 20 MG capsule Take 1 capsule (20 mg total) by mouth daily. 1 tablet a day  (Patient not taking: Reported on 12/22/2014) 30 capsule 6 Not Taking at Unknown time  . Prenatal Vit-Fe Fumarate-FA (PRENATAL MULTIVITAMIN) TABS tablet Take 1 tablet by mouth at bedtime.   12/25/2014 at Unknown time  . progesterone (PROMETRIUM) 200 MG capsule Place 1 capsule (200 mg total) vaginally at bedtime. (Patient not taking: Reported on 12/22/2014) 30 capsule 4 Not Taking at Unknown time    Allergies  Allergen Reactions  . Biaxin [Clarithromycin] Hives  . Cefzil [Cefprozil] Nausea Only  . Ciprofloxacin Hives  . Hydrocodone Other (See Comments)    Causes patient to feel wired  . Percocet [Oxycodone-Acetaminophen] Other (See Comments)    Causes patient to feel wired  . Sulfa Antibiotics Hives  . Percodan [Oxycodone-Aspirin] Anxiety    Review of Systems: Negative except for what is mentioned in HPI.  Physical Exam: BP 132/89 mmHg  Pulse 84  Temp(Src) 98.1 F (36.7 C) (Oral)  Resp 20  LMP 04/18/2014 FHR tracing: 135 bpm, reactive CONSTITUTIONAL: Well-developed, well-nourished female in no acute distress.  HENT:  Normocephalic, atraumatic, External right and left ear normal. Oropharynx is clear and moist EYES: Conjunctivae and EOM are normal. Pupils are equal, round, and reactive to light. No scleral icterus.  NECK: Normal range of motion, supple, no masses SKIN: Skin is warm and dry. No rash noted. Not diaphoretic. No erythema. No pallor. [place]: Alert and oriented to person, place, and time. Normal reflexes, muscle tone coordination. No cranial nerve deficit noted. PSYCHIATRIC: Normal mood and affect. Normal behavior. Normal judgment and thought content. CARDIOVASCULAR: Normal heart rate noted, regular rhythm RESPIRATORY: Effort and breath sounds normal, no problems with respiration noted ABDOMEN: Soft, nontender, nondistended, gravid.  PELVIC: Cervix 3/90/-2/footling breech presentation MUSCULOSKELETAL: Normal range of motion. No edema and no tenderness. 2+ distal  pulses.   Pertinent Labs/Studies:   Results for orders placed or performed during the hospital encounter of 12/27/14 (from the past 72 hour(s))  CBC     Status: Abnormal   Collection Time: 12/27/14  5:50 AM  Result Value Ref Range   WBC 11.4 (H) 4.0 - 10.5 K/uL   RBC 4.26 3.87 - 5.11 MIL/uL   Hemoglobin 13.1 12.0 - 15.0 g/dL   HCT 38.7 36.0 - 46.0 %   MCV 90.8 78.0 - 100.0 fL   MCH 30.8 26.0 - 34.0 pg   MCHC 33.9 30.0 - 36.0 g/dL   RDW 13.7 11.5 - 15.5 %   Platelets 253 150 - 400 K/uL  Glucose, capillary     Status: None   Collection Time: 12/27/14  6:08 AM  Result Value Ref Range   Glucose-Capillary 81 65 - 99 mg/dL    Assessment and Plan :MALLIKA SANMIGUEL is a 34 y.o. [id number] at [redacted]w[redacted]d being admitted for cesarean section for fetal breech presentation in active labor. The risks of cesarean  section discussed with the patient included but were not limited to: bleeding which may require transfusion or reoperation; infection which may require antibiotics; injury to bowel, bladder, ureters or other surrounding organs; injury to the fetus; need for additional procedures including hysterectomy in the event of a life-threatening hemorrhage; placental abnormalities wth subsequent pregnancies, incisional problems, thromboembolic phenomenon and other postoperative/anesthesia complications. The patient concurred with the proposed plan, giving informed written consent for the procedure. Patient has been NPO since last night she will remain NPO for procedure. Anesthesia and OR aware. Preoperative prophylactic antibiotics and SCDs ordered on call to the OR. To OR when ready.    Cindy Schneiders, MD, [place]  Attending [place], [facility]

## 2014-12-27 NOTE — Consult Note (Signed)
Neonatology Note:   Attendance at C-section:    I was asked by Dr. Harolyn Rutherford to attend this repeat C/S at 15 0/7 weeks after onset of labor and breech presentation. The mother is a G3P1A1 O pos, GBS pos with GDM, gestational hypertension, and unicornuate uterus with persistent footling breech. ROM 2.5 hours prior to delivery, fluid clear. Infant vigorous with good spontaneous cry and tone. Needed only minimal bulb suctioning. Ap 9/9. Lungs clear to ausc in DR. To CN to care of Pediatrician.   Real Cons, MD

## 2014-12-28 ENCOUNTER — Encounter (HOSPITAL_COMMUNITY): Payer: Self-pay

## 2014-12-28 ENCOUNTER — Inpatient Hospital Stay (HOSPITAL_COMMUNITY)
Admission: RE | Admit: 2014-12-28 | Payer: BC Managed Care – PPO | Source: Ambulatory Visit | Admitting: Obstetrics & Gynecology

## 2014-12-28 ENCOUNTER — Encounter (HOSPITAL_COMMUNITY): Admission: RE | Payer: Self-pay | Source: Ambulatory Visit

## 2014-12-28 LAB — CBC
HCT: 31.9 % — ABNORMAL LOW (ref 36.0–46.0)
Hemoglobin: 10.3 g/dL — ABNORMAL LOW (ref 12.0–15.0)
MCH: 29.9 pg (ref 26.0–34.0)
MCHC: 32.3 g/dL (ref 30.0–36.0)
MCV: 92.5 fL (ref 78.0–100.0)
Platelets: 208 10*3/uL (ref 150–400)
RBC: 3.45 MIL/uL — ABNORMAL LOW (ref 3.87–5.11)
RDW: 14 % (ref 11.5–15.5)
WBC: 9.7 10*3/uL (ref 4.0–10.5)

## 2014-12-28 LAB — GLUCOSE, CAPILLARY: Glucose-Capillary: 99 mg/dL (ref 65–99)

## 2014-12-28 LAB — BIRTH TISSUE RECOVERY COLLECTION (PLACENTA DONATION)

## 2014-12-28 SURGERY — Surgical Case
Anesthesia: Regional

## 2014-12-28 MED ORDER — SCOPOLAMINE 1 MG/3DAYS TD PT72
1.0000 | MEDICATED_PATCH | TRANSDERMAL | Status: DC
  Administered 2014-12-28: 1.5 mg via TRANSDERMAL
  Filled 2014-12-28: qty 1

## 2014-12-28 MED ORDER — SCOPOLAMINE 1 MG/3DAYS TD PT72: 1.0000 | MEDICATED_PATCH | TRANSDERMAL | Status: DC

## 2014-12-28 NOTE — Progress Notes (Signed)
Post Partum Day #2 Subjective:  [name] is a 34 y.o. [id number] [redacted]w[redacted]d s/p pLTCS 2/2 breech.  No acute events overnight.  Pt denies problems with ambulating, voiding or po intake.  She denies nausea or vomiting.  Pain is well controlled.  She has had flatus. She has not had bowel movement.  Lochia Minimal.  Plan for birth control is no method.  Method of Feeding: Breast and Bottle  Objective: Blood pressure 115/75, pulse 75, temperature 97.9 F (36.6 C), temperature source Oral, resp. rate 20, last menstrual period 04/18/2014, SpO2 97 %, unknown if currently breastfeeding.  Physical Exam:  General: alert, cooperative and no distress Lochia:normal flow Chest: CTAB Heart: RRR no m/r/g Abdomen: +BS, soft, nontender,  Uterine Fundus: firm, below the umbilicus DVT Evaluation: No evidence of DVT seen on physical exam. Extremities: 1+ pedal edema Inc: clean, old dried blood, non-erythematous    Recent Labs  12/27/14 0550  HGB 13.1  HCT 38.7    Assessment/Plan:  ASSESSMENT: [name] is a 34 y.o. [id number] [redacted]w[redacted]d s/p pLTCS  Plan for discharge tomorrow, Breastfeeding and Contraception no method  BP has been well controlled and BSS have been stable    LOS: 1 day   Melina Schools 12/28/2014, 7:55 AM   OB fellow attestation I have seen and examined this patient and agree with above documentation in the resident's note.   [name] is a 34 y.o. [id number] s/p CS for breech.   Pain is well controlled.  Plan for birth control is None.  Method of Feeding: Breast  PE:  BP 115/75 mmHg  Pulse 75  Temp(Src) 97.9 F (36.6 C) (Oral)  Resp 20  Ht 5' (1.524 m)  Wt 158 lb (71.668 kg)  BMI 30.86 kg/m2  SpO2 97%  LMP 04/18/2014  Breastfeeding? Unknown Fundus firm   Recent Labs  12/27/14 0550 12/28/14 0715  HGB 13.1 10.3*  HCT 38.7 31.9*   Plan: discharge today - postpartum care discussed - f/u clinic in 6 weeks for postpartum visit - Patient may  stay for infant concern (hyperbilirubinemia)  Caren Macadam, MD, MPH 11:00 AM

## 2014-12-29 LAB — RPR: RPR Ser Ql: NONREACTIVE

## 2014-12-29 MED ORDER — HYDROMORPHONE HCL 2 MG PO TABS: 1.0000 mg | ORAL_TABLET | Freq: Four times a day (QID) | ORAL | Status: DC | PRN

## 2014-12-29 MED ORDER — IBUPROFEN 600 MG PO TABS: 600.0000 mg | ORAL_TABLET | Freq: Four times a day (QID) | ORAL | Status: DC | PRN

## 2014-12-29 MED ORDER — ONDANSETRON 4 MG PO TBDP: 4.0000 mg | ORAL_TABLET | Freq: Four times a day (QID) | ORAL | Status: DC | PRN

## 2014-12-29 NOTE — Progress Notes (Signed)
Subjective: Postpartum Day 2: Cesarean Delivery [name] Patient reports tolerating PO, + flatus and no problems voiding. Ambulating tolerated while on dilaudid for pain above incision site and described as "feeling like a torn muscle". Patient says she is due for another dose of dilaudid around 7:30am on 12/29/2014. MOF: Is going to continue to try breastfeeding, currently supplementing with Similac. She is passing gas, and urinating fine, but has not had a bowel movement. MOC: Considering if they want another kid, to adopt a boy.  Objective: Vital signs in last 24 hours: Temp:  [98.1 F (36.7 C)-98.2 F (36.8 C)] 98.2 F (36.8 C) (07/14 0558) Pulse Rate:  [91-97] 91 (07/14 0558) Resp:  [16-18] 16 (07/14 0558) BP: (114-124)/(76-81) 114/76 mmHg (07/14 0558)  Physical Exam:  General: alert, cooperative and no distress Incision: no significant drainage, no significant erythema DVT Evaluation: Negative Homan's sign. Slight edema in both ankle/feet, L ankle/feet>than right ankle/foot   Recent Labs  12/27/14 0550 12/28/14 0715  HGB 13.1 10.3*  HCT 38.7 31.9*    Assessment/Plan: A: Status post Cesarean section(pLTCS) secondary to breech presentation. Doing well postoperatively.  She can likely be discharged to go home with standard precautions and return for postpartum glucose check, assess pain, and signs of anemia by CBC.  At this time discharge dependent on baby's bilirubin levels, serum levels drawn at 18:20pm on 12/29/2014, if elevated mom will stay with baby.  P: Discharge course Update at 7:28pm baby total bilirubin up to 9.6, from 9.3. Nurse will contact Dr. Lockie Pares for further intervention for baby at this time, [name] may be staying through the night. Baby T-Bili up from 9.3->9.6 due to increase in I-BILI. Assessed with Dr. Lockie Pares, about bili levels, Dr. Lockie Pares approved baby and mother ok to go home.          Cindy Cancer Jr. 12/29/2014, 2:22  PM

## 2014-12-29 NOTE — Progress Notes (Signed)
Notified by CNA that CBG machine was malfunctioning (would not recognize patient as "valid patient" then would not accept blood) and patient had been stuck twice.  Patient requested deferring fasting CBG this morning.

## 2014-12-29 NOTE — Discharge Summary (Signed)
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 [id number] s/p rLTCS 2/2 breech presentation.  Patient was admitted 07/12.  She has postpartum course that was uncomplicated including no problems with ambulating, PO intake, urination, pain, or bleeding. The pt feels ready to go home and  will be discharged with outpatient follow-up.   Today: No acute events overnight.  Pt denies problems with ambulating, voiding or po intake.  She denies nausea or vomiting.  Pain is well controlled.  She has had flatus. She has not had bowel movement.  Lochia Minimal.  Plan for birth control is  no method.  Method of Feeding: Breast and Bottle   Physical Exam:  General: alert and cooperative Lochia: appropriate Uterine Fundus: firm Incision: healing well, no significant drainage DVT Evaluation: No evidence of DVT seen on physical exam.  H/H: Lab Results  Component Value Date/Time   HGB 10.3* 12/28/2014 07:15 AM   HCT 31.9* 12/28/2014 07:15 AM   HCT 36.6 10/14/2014 08:52 AM    Discharge Diagnoses: Term Pregnancy-delivered  Discharge Information: Date: 12/29/2014 Activity: pelvic rest Diet: routine  Medications: PNV, Ibuprofen and Diluadid Breast feeding:  Yes Condition: stable Instructions: refer to handout Discharge to: home   Discharge Instructions    Diet - low sodium heart healthy    Complete by:  As directed       Increase activity slowly    Complete by:  As directed             Medication List    STOP taking these medications        glyBURIDE 2.5 MG tablet  Commonly known as:  DIABETA      TAKE these medications        HYDROmorphone 2 MG tablet  Commonly known as:  DILAUDID  Take 0.5 tablets (1 mg total) by mouth every 6 (six) hours as needed for moderate pain or severe pain.     ibuprofen 600 MG tablet  Commonly known as:  ADVIL,MOTRIN  Take 1 tablet (600 mg total) by mouth every 6 (six) hours as needed.     prenatal multivitamin Tabs tablet  Take 1 tablet by mouth daily at 12 noon.           Follow-up Information    Follow up with [facility]. Schedule an appointment as soon as possible for a visit in 2 weeks.   Why:  For your postpartum appointment.   Contact information:   [place] [address] [phone]      [name] ,MD OB Fellow 12/29/2014,7:49 PM   I have seen and examined this patient and agree the above assessment. [name] 12/30/2014 2:35 AM

## 2014-12-29 NOTE — Discharge Instructions (Signed)

## 2015-01-04 ENCOUNTER — Ambulatory Visit (INDEPENDENT_AMBULATORY_CARE_PROVIDER_SITE_OTHER): Payer: BC Managed Care – PPO | Admitting: Obstetrics & Gynecology

## 2015-01-04 ENCOUNTER — Encounter: Payer: Self-pay | Admitting: Obstetrics & Gynecology

## 2015-01-04 VITALS — BP 128/80 | HR 76 | Wt 142.4 lb

## 2015-01-04 DIAGNOSIS — Z9889 Other specified postprocedural states: Secondary | ICD-10-CM

## 2015-01-04 NOTE — Progress Notes (Signed)
Patient ID: [name], female   DOB: 12-26-1980, 34 y.o.   MRN: [id number]  HPI: Patient returns for routine postoperative follow-up having undergone primary Caesarean  on 12/27/2014.  The patient's immediate postoperative recovery has been unremarkable. Since hospital discharge the patient reports no problems.   Current Outpatient Prescriptions: ibuprofen (ADVIL,MOTRIN) 600 MG tablet, Take 1 tablet (600 mg total) by mouth every 6 (six) hours as needed., Disp: 30 tablet, Rfl: 0 Prenatal Vit-Fe Fumarate-FA (PRENATAL MULTIVITAMIN) TABS tablet, Take 1 tablet by mouth daily at 12 noon., Disp: , Rfl:  HYDROmorphone (DILAUDID) 2 MG tablet, Take 0.5 tablets (1 mg total) by mouth every 6 (six) hours as needed for moderate pain or severe pain. (Patient not taking: Reported on 01/04/2015), Disp: 30 tablet, Rfl: 0 ondansetron (ZOFRAN ODT) 4 MG disintegrating tablet, Take 1 tablet (4 mg total) by mouth every 6 (six) hours as needed for nausea. (Patient not taking: Reported on 01/04/2015), Disp: 30 tablet, Rfl: 2  No current facility-administered medications for this visit.    Blood pressure 128/80, pulse 76, weight 142 lb 6.4 oz (64.592 kg), last menstrual period 04/18/2014, unknown if currently breastfeeding.  Physical Exam: Incision clean dry intact  Diagnostic Tests:   Pathology:   Impression: S/p primary Caesarean  Plan:   Follow up: 4  weeks pp exam  Florian Buff, MD

## 2015-01-09 ENCOUNTER — Inpatient Hospital Stay (HOSPITAL_COMMUNITY)
Admission: RE | Admit: 2015-01-09 | Discharge: 2015-01-09 | Disposition: A | Discharge status: Home / Self Care | Payer: BC Managed Care – PPO | Source: Ambulatory Visit

## 2015-01-09 NOTE — Progress Notes (Signed)
This encounter was created in error - please disregard.

## 2015-01-10 NOTE — Progress Notes (Signed)
This encounter was created in error - please disregard.

## 2015-01-12 ENCOUNTER — Encounter (HOSPITAL_COMMUNITY): Payer: Self-pay

## 2015-01-12 NOTE — Progress Notes (Signed)
Addendum to infant measurements.  Correction from 20 cm length to the correct length of 20 inches.  Length verified with NT Sherrian Divers.

## 2015-01-16 ENCOUNTER — Telehealth: Payer: Self-pay | Admitting: *Deleted

## 2015-01-16 NOTE — Telephone Encounter (Signed)
Pt states would like to return to work prior to her 8 weeks recovery time from c-section. Informed pt to discuss with the provider at her appt on 02/01/2015. Pt verbalized understanding.

## 2015-02-01 ENCOUNTER — Ambulatory Visit (INDEPENDENT_AMBULATORY_CARE_PROVIDER_SITE_OTHER): Payer: BC Managed Care – PPO | Admitting: Adult Health

## 2015-02-01 ENCOUNTER — Encounter: Payer: Self-pay | Admitting: Adult Health

## 2015-02-01 DIAGNOSIS — F419 Anxiety disorder, unspecified: Secondary | ICD-10-CM | POA: Insufficient documentation

## 2015-02-01 DIAGNOSIS — Z3009 Encounter for other general counseling and advice on contraception: Secondary | ICD-10-CM

## 2015-02-01 HISTORY — DX: Anxiety disorder, unspecified: F41.9

## 2015-02-01 MED ORDER — ALPRAZOLAM 0.5 MG PO TABS: 0.5000 mg | ORAL_TABLET | Freq: Two times a day (BID) | ORAL | Status: DC | PRN

## 2015-02-01 NOTE — Patient Instructions (Signed)
No sex call with next period for IUD insertion Levonorgestrel intrauterine device (IUD) What is this medicine? LEVONORGESTREL IUD (LEE voe nor jes trel) is a contraceptive (birth control) device. The device is placed inside the uterus by a healthcare professional. It is used to prevent pregnancy and can also be used to treat heavy bleeding that occurs during your period. Depending on the device, it can be used for 3 to 5 years. This medicine may be used for other purposes; ask your health care provider or pharmacist if you have questions. COMMON BRAND NAME(S): Verda Cumins What should I tell my health care provider before I take this medicine? They need to know if you have any of these conditions: -abnormal Pap smear -cancer of the breast, uterus, or cervix -diabetes -endometritis -genital or pelvic infection now or in the past -have more than one sexual partner or your partner has more than one partner -heart disease -history of an ectopic or tubal pregnancy -immune system problems -IUD in place -liver disease or tumor -problems with blood clots or take blood-thinners -use intravenous drugs -uterus of unusual shape -vaginal bleeding that has not been explained -an unusual or allergic reaction to levonorgestrel, other hormones, silicone, or polyethylene, medicines, foods, dyes, or preservatives -pregnant or trying to get pregnant -breast-feeding How should I use this medicine? This device is placed inside the uterus by a health care professional. Talk to your pediatrician regarding the use of this medicine in children. Special care may be needed. Overdosage: If you think you have taken too much of this medicine contact a poison control center or emergency room at once. NOTE: This medicine is only for you. Do not share this medicine with others. What if I miss a dose? This does not apply. What may interact with this medicine? Do not take this medicine with any of the  following medications: -amprenavir -bosentan -fosamprenavir This medicine may also interact with the following medications: -aprepitant -barbiturate medicines for inducing sleep or treating seizures -bexarotene -griseofulvin -medicines to treat seizures like carbamazepine, ethotoin, felbamate, oxcarbazepine, phenytoin, topiramate -modafinil -pioglitazone -rifabutin -rifampin -rifapentine -some medicines to treat HIV infection like atazanavir, indinavir, lopinavir, nelfinavir, tipranavir, ritonavir -St. John's wort -warfarin This list may not describe all possible interactions. Give your health care provider a list of all the medicines, herbs, non-prescription drugs, or dietary supplements you use. Also tell them if you smoke, drink alcohol, or use illegal drugs. Some items may interact with your medicine. What should I watch for while using this medicine? Visit your doctor or health care professional for regular check ups. See your doctor if you or your partner has sexual contact with others, becomes HIV positive, or gets a sexual transmitted disease. This product does not protect you against HIV infection (AIDS) or other sexually transmitted diseases. You can check the placement of the IUD yourself by reaching up to the top of your vagina with clean fingers to feel the threads. Do not pull on the threads. It is a good habit to check placement after each menstrual period. Call your doctor right away if you feel more of the IUD than just the threads or if you cannot feel the threads at all. The IUD may come out by itself. You may become pregnant if the device comes out. If you notice that the IUD has come out use a backup birth control method like condoms and call your health care provider. Using tampons will not change the position of the IUD and are okay  to use during your period. What side effects may I notice from receiving this medicine? Side effects that you should report to your  doctor or health care professional as soon as possible: -allergic reactions like skin rash, itching or hives, swelling of the face, lips, or tongue -fever, flu-like symptoms -genital sores -high blood pressure -no menstrual period for 6 weeks during use -pain, swelling, warmth in the leg -pelvic pain or tenderness -severe or sudden headache -signs of pregnancy -stomach cramping -sudden shortness of breath -trouble with balance, talking, or walking -unusual vaginal bleeding, discharge -yellowing of the eyes or skin Side effects that usually do not require medical attention (report to your doctor or health care professional if they continue or are bothersome): -acne -breast pain -change in sex drive or performance -changes in weight -cramping, dizziness, or faintness while the device is being inserted -headache -irregular menstrual bleeding within first 3 to 6 months of use -nausea This list may not describe all possible side effects. Call your doctor for medical advice about side effects. You may report side effects to FDA at 1-800-FDA-1088. Where should I keep my medicine? This does not apply. NOTE: This sheet is a summary. It may not cover all possible information. If you have questions about this medicine, talk to your doctor, pharmacist, or health care provider.  2015, Elsevier/Gold Standard. (2011-07-04 13:54:04)

## 2015-02-01 NOTE — Progress Notes (Signed)
Patient ID: [name], female   DOB: 1981/02/22, 34 y.o.   MRN: [id number] Casondra is a 34 year old white female, married in for her postpartum exam.She wants to return ot work 02/06/15.  Delivery Date:12/27/14  Method of Delivery: Primary C-section baby girl Katie, 6lbs  Sexual Activity since delivery: No  Method of Feeding: bottle  Number of weeks bleeding post delivery: 1 week  Review of Systems: Patient denies any headaches, hearing loss, fatigue, blurred vision, shortness of breath, chest pain, abdominal pain, problems with bowel movements, urination, or intercourse(no sex yet). No joint pain or mood swings.Feels anxious at times. Reviewed past medical,surgical, social and family history. Reviewed medications and allergies.  Depression Score: 4  BP 128/76 mmHg  Pulse 96  Ht 4\' 11"  (1.499 m)  Wt 137 lb (62.143 kg)  BMI 27.66 kg/m2  LMP 02/01/2015  Breastfeeding? No Pelvic Exam:   External genitalia is normal in appearance, no lesions.  The vagina has good color, moisture and rugae, no lesions.Urethra has no masses or tenderness noted. The cervix is bulbous.  Uterus is felt to be normal size, shape, and contour, well involuted.  No adnexal masses or tenderness noted.Bladder is non tender, no masses felt.Abdominal scar well healed.She thinks she wants the mirena IUD.   Impression:  Status post delivery, post partum check, depression screening, contraceptive counseling,anxiety .   Plan:   Note given to return to work 02/06/15 Call with period for IUD insertion, no sex Rx xanax 0.5 mg #30 1 bid prn with 1 refill Review handout on IUD, Angie to check insurance on IUD

## 2015-02-06 ENCOUNTER — Encounter: Payer: Self-pay | Admitting: *Deleted

## 2015-02-06 NOTE — Progress Notes (Signed)
Patient ID: [name], female   DOB: 1980-09-15, 34 y.o.   MRN: [id number] Pt husband, Cyril Railey, came into the office stating he had an emergency and needed to speak with a nurse. After calling Mr. Cauthon back into a private setting, Mr. Denherder states "Kelsye had asked for a divorce and he felt it was related to postpartum depression and stated pt had asked for a divorce after their first child was born." Informed Mr. Moten, pt husband, was unable to give specific information in regards to pt care due to him not being on pt HIPAA privacy consent form. He stated he understood and wanted to know where an individual would go if needed to receive treatment for postpartum depression, PCP vs OB. Informed Mr. Nesheim that a patient would be treated by their OB provider for Postpartum depression. Mr. Weitz states he would discuss with pt making an appt to be evaluated for Postpartum depression and states they had an appt for therapy.

## 2015-02-09 ENCOUNTER — Telehealth: Payer: Self-pay | Admitting: *Deleted

## 2015-02-09 NOTE — Telephone Encounter (Signed)
Pt called stating her and her husband are going through a separation. Pt also states her husband is encouraging her to come into our office and discuss with provider the complications they are having in their marriage possible result of postpartum depression. Pt states she has seen Derrek Monaco, NP for her postpartum appt and did fill out the postpartum depression scale. Pt states she does not feel sad, anxious, or depressed. Informed pt that her postpartum depression scale score was 4 at her last appt. Also, informed pt would recommend making an appt to discuss concerns with provider. Pt states she will call office back to schedule an appt.

## 2015-02-16 ENCOUNTER — Telehealth: Payer: Self-pay | Admitting: *Deleted

## 2015-02-19 NOTE — Telephone Encounter (Signed)
Pt now resting well when stressors are controlled. Will not Rx sleep aid.

## 2015-02-28 ENCOUNTER — Encounter: Payer: Self-pay | Admitting: Family Medicine

## 2015-02-28 ENCOUNTER — Ambulatory Visit (INDEPENDENT_AMBULATORY_CARE_PROVIDER_SITE_OTHER): Payer: BC Managed Care – PPO | Admitting: Family Medicine

## 2015-02-28 VITALS — BP 118/76 | Temp 98.7°F | Ht 59.0 in | Wt 129.0 lb

## 2015-02-28 DIAGNOSIS — B9689 Other specified bacterial agents as the cause of diseases classified elsewhere: Secondary | ICD-10-CM

## 2015-02-28 DIAGNOSIS — J019 Acute sinusitis, unspecified: Secondary | ICD-10-CM

## 2015-02-28 MED ORDER — AMOXICILLIN-POT CLAVULANATE 875-125 MG PO TABS: 1.0000 | ORAL_TABLET | Freq: Two times a day (BID) | ORAL | Status: DC

## 2015-02-28 NOTE — Progress Notes (Signed)
   Subjective:    Patient ID: [name], female    DOB: 1981/01/21, 34 y.o.   MRN: [id number]  Cough This is a new problem. The current episode started yesterday. Associated symptoms include a fever, headaches, nasal congestion, rhinorrhea and a sore throat. Pertinent negatives include no chest pain, ear pain, shortness of breath or wheezing. Associated symptoms comments: vomiting. Treatments tried: nyquil.   This patient relates that all of this kicked in over the past 36 hours start off with some body aches head congestion sinus pressure now mainly with sinus pressure low-grade fever mucoid drainage history of sinus infections   Review of Systems  Constitutional: Positive for fever. Negative for activity change.  HENT: Positive for congestion, rhinorrhea and sore throat. Negative for ear pain.   Eyes: Negative for discharge.  Respiratory: Positive for cough. Negative for shortness of breath and wheezing.   Cardiovascular: Negative for chest pain.  Neurological: Positive for headaches.       Objective:   Physical Exam  Constitutional: She appears well-developed.  HENT:  Head: Normocephalic.  Nose: Nose normal.  Mouth/Throat: Oropharynx is clear and moist. No oropharyngeal exudate.  Neck: Neck supple.  Cardiovascular: Normal rate and normal heart sounds.   No murmur heard. Pulmonary/Chest: Effort normal and breath sounds normal. She has no wheezes.  Lymphadenopathy:    She has no cervical adenopathy.  Skin: Skin is warm and dry.  Nursing note and vitals reviewed.         Assessment & Plan:  Acute rhinosinusitis underlying viral illness work excuse for the next couple days antibiotics prescribed for the sinus infection warning signs discuss

## 2015-03-27 ENCOUNTER — Telehealth: Payer: Self-pay | Admitting: Family Medicine

## 2015-03-27 NOTE — Telephone Encounter (Signed)
Patient went to the urgent care last week and found she pinched her sciatic nerve.  She says she already has an anti-inflammatory, but she wants to know if it would be okay to take [place] back and body as well?

## 2015-03-27 NOTE — Telephone Encounter (Signed)
Not with a anti inflammatory, taking both of these with increase the risk of stomach ulcers if necessary muscle relaxer could be used at bedtime (if not breast-feeding)

## 2015-03-27 NOTE — Telephone Encounter (Signed)
LMRC

## 2015-03-30 ENCOUNTER — Ambulatory Visit: Payer: BC Managed Care – PPO | Admitting: Family Medicine

## 2015-04-05 ENCOUNTER — Ambulatory Visit (INDEPENDENT_AMBULATORY_CARE_PROVIDER_SITE_OTHER): Payer: BC Managed Care – PPO | Admitting: Family Medicine

## 2015-04-05 ENCOUNTER — Encounter: Payer: Self-pay | Admitting: Family Medicine

## 2015-04-05 VITALS — BP 112/74 | Temp 98.6°F | Ht 59.0 in | Wt 128.1 lb

## 2015-04-05 DIAGNOSIS — J219 Acute bronchiolitis, unspecified: Secondary | ICD-10-CM

## 2015-04-05 DIAGNOSIS — J019 Acute sinusitis, unspecified: Secondary | ICD-10-CM | POA: Diagnosis not present

## 2015-04-05 DIAGNOSIS — B9689 Other specified bacterial agents as the cause of diseases classified elsewhere: Secondary | ICD-10-CM

## 2015-04-05 MED ORDER — HYDROCODONE-HOMATROPINE 5-1.5 MG/5ML PO SYRP: ORAL_SOLUTION | ORAL | Status: DC

## 2015-04-05 MED ORDER — AMOXICILLIN-POT CLAVULANATE 875-125 MG PO TABS: 1.0000 | ORAL_TABLET | Freq: Two times a day (BID) | ORAL | Status: DC

## 2015-04-05 NOTE — Progress Notes (Signed)
   Subjective:    Patient ID: [name], female    DOB: January 14, 1981, 34 y.o.   MRN: [id number]  Sinusitis This is a new problem. The current episode started in the past 7 days. The problem is unchanged. There has been no fever. Associated symptoms include congestion, coughing, ear pain and a sore throat. Pertinent negatives include no shortness of breath. Past treatments include oral decongestants (Robitussin). The treatment provided no relief.   patient relates illness progressed from a virus Patient states that she has no other concerns at this time.  Patient denies any other issues states this started up several days ago more as a virusReview of Systems  Constitutional: Negative for fever and activity change.  HENT: Positive for congestion, ear pain, rhinorrhea and sore throat.   Eyes: Negative for discharge.  Respiratory: Positive for cough. Negative for shortness of breath and wheezing.   Cardiovascular: Negative for chest pain.       Objective:   Physical Exam  Constitutional: She appears well-developed.  HENT:  Head: Normocephalic.  Nose: Nose normal.  Mouth/Throat: Oropharynx is clear and moist. No oropharyngeal exudate.  Neck: Neck supple.  Cardiovascular: Normal rate and normal heart sounds.   No murmur heard. Pulmonary/Chest: Effort normal and breath sounds normal. She has no wheezes.  Lymphadenopathy:    She has no cervical adenopathy.  Skin: Skin is warm and dry.  Nursing note and vitals reviewed.    Moderate sinus pressure subjective frontal sinuses noted     Assessment & Plan:  Acute rhinosinusitis antibiotics prescribed warning signs discuss should gradually get better over the course of next couple weeks if not improving notify us. If high fevers difficulty breathing or worse follow-up immediately or go to ER.

## 2015-04-05 NOTE — Telephone Encounter (Signed)
Patient notified

## 2015-05-09 ENCOUNTER — Telehealth: Payer: Self-pay | Admitting: *Deleted

## 2015-05-09 NOTE — Telephone Encounter (Signed)
Having issues has separated from husband,appt made for Monday at her request

## 2015-05-15 ENCOUNTER — Encounter: Payer: Self-pay | Admitting: Obstetrics and Gynecology

## 2015-05-15 ENCOUNTER — Ambulatory Visit (INDEPENDENT_AMBULATORY_CARE_PROVIDER_SITE_OTHER): Payer: BC Managed Care – PPO | Admitting: Obstetrics and Gynecology

## 2015-05-15 VITALS — BP 118/76 | Ht 59.0 in | Wt 129.0 lb

## 2015-05-15 DIAGNOSIS — O99345 Other mental disorders complicating the puerperium: Secondary | ICD-10-CM | POA: Insufficient documentation

## 2015-05-15 DIAGNOSIS — F53 Postpartum depression: Secondary | ICD-10-CM

## 2015-05-15 MED ORDER — VENLAFAXINE HCL ER 75 MG PO CP24: 75.0000 mg | ORAL_CAPSULE | Freq: Every day | ORAL | Status: DC

## 2015-05-15 NOTE — Progress Notes (Signed)
Patient ID: [name], female   DOB: 05-Oct-1980, 34 y.o.   MRN: [id number] Pt here today to "talk" with Dr. Glo Herring.    [facility] Visit  Patient name: [name] MRN [id number]  Date of birth: 05-05-1981  CC & HPI:  [name] is a 34 y.o. female presenting today for ? Postpartum depression. Pt is separated from husband, with ongoing counselling for both individuals , plus couple counselling,  Pt finds partner more supportive Pt experiences anxiety , feels less in control , less organized. Children are well  Behaved. Older is in preschool, baby is a "good ' baby who sleeps well.  ROS:  NO si, or hi.   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 Klippel-Feil deformity     syrinx  . Asthma   . Hypertension   . Pregnant 05/31/2014  . Spotting 05/31/2014  . AMA (advanced maternal age) multigravida 35+ 05/31/2014  . Anxiety 02/01/2015                              Surgical Hx:    Past Surgical History  Procedure Laterality Date  . Cholecystectomy    . Dilation and curettage of uterus    . Cesarean section N/A 12/27/2014    Procedure: CESAREAN SECTION;  Surgeon: Osborne Oman, MD;  Location: [facility];  Service: Obstetrics;  Laterality: N/A;   Medications: Reviewed & Updated - see associated section                       Current outpatient prescriptions:  .  venlafaxine XR (EFFEXOR-XR) 75 MG 24 hr capsule, Take 1 capsule (75 mg total) by mouth daily. May take HS if drowsiness experienced., Disp: 30 capsule, Rfl: 3   Social History: Reviewed -  reports that she has never smoked. She has never used smokeless tobacco.  Objective Findings:  Vitals: Blood pressure 118/76, height 4\' 11"  (1.499 m), weight 129 lb (58.514 kg), last menstrual period 05/08/2015, not currently breastfeeding.  Physical Examination: General appearance - alert, well  appearing, and in no distress, oriented to person, place, and time and normal appearing weight Mental status - alert, oriented to person, place, and time, normal mood, behavior, speech, dress, motor activity, and thought processes, affect appropriate to mood Eyes - pupils equal and reactive, extraocular eye movements intact   Assessment & Plan:   A:  1. Residual postpartum depression  P:  1. Effexor 37.5 x 1 wk then 75mg /d 2  Recheck 30 d. I spent 25 minutes with the visit with >than 50% spent in counseling and coordination of care.

## 2015-05-16 ENCOUNTER — Telehealth: Payer: Self-pay | Admitting: *Deleted

## 2015-05-16 MED ORDER — ONDANSETRON 4 MG PO TBDP: 4.0000 mg | ORAL_TABLET | Freq: Four times a day (QID) | ORAL | Status: DC | PRN

## 2015-05-16 NOTE — Telephone Encounter (Signed)
zofran ODT ordered due to nausea of Effexor.

## 2015-06-09 ENCOUNTER — Ambulatory Visit: Payer: BC Managed Care – PPO | Admitting: Obstetrics and Gynecology

## 2015-07-26 ENCOUNTER — Telehealth: Payer: Self-pay | Admitting: *Deleted

## 2015-07-26 ENCOUNTER — Ambulatory Visit (INDEPENDENT_AMBULATORY_CARE_PROVIDER_SITE_OTHER): Payer: BC Managed Care – PPO | Admitting: Adult Health

## 2015-07-26 ENCOUNTER — Encounter: Payer: Self-pay | Admitting: Adult Health

## 2015-07-26 VITALS — BP 126/90 | HR 80 | Ht 60.0 in | Wt 126.5 lb

## 2015-07-26 DIAGNOSIS — G479 Sleep disorder, unspecified: Secondary | ICD-10-CM | POA: Diagnosis not present

## 2015-07-26 DIAGNOSIS — N920 Excessive and frequent menstruation with regular cycle: Secondary | ICD-10-CM

## 2015-07-26 DIAGNOSIS — Z3202 Encounter for pregnancy test, result negative: Secondary | ICD-10-CM | POA: Diagnosis not present

## 2015-07-26 HISTORY — DX: Excessive and frequent menstruation with regular cycle: N92.0

## 2015-07-26 HISTORY — DX: Sleep disorder, unspecified: G47.9

## 2015-07-26 LAB — POCT URINE PREGNANCY: Preg Test, Ur: NEGATIVE

## 2015-07-26 MED ORDER — MEGESTROL ACETATE 40 MG PO TABS: ORAL_TABLET | ORAL | Status: DC

## 2015-07-26 MED ORDER — ZOLPIDEM TARTRATE 5 MG PO TABS: 5.0000 mg | ORAL_TABLET | Freq: Every evening | ORAL | Status: DC | PRN

## 2015-07-26 NOTE — Telephone Encounter (Signed)
Pt states that she is going through a pad in less than an hour. Pt states that she is under a lot of stress, light headed, no apatite, no thirst. Pt states that she is not on any BC at this time. Pt states that she started bleeding heavy yesterday afternoon, and she is clotting but not a lot of cramps or pain. Pt states that her period has not been like this since before babies. Pt given an appointment for today.

## 2015-07-26 NOTE — Progress Notes (Signed)
Subjective:     Patient ID: [name], female   DOB: Nov 08, 1980, 35 y.o.   MRN: [id number]  HPI [name] is a 35 year old white female, separated in complaining of period started Sunday and was really heavy today and yesterday, messed clothes up and is changing every hour or so, some clots and is not sleeping well.She says she and husband talking and working together, she stopped Effexor.  She said last period was normal.  Review of Systems Patient denies any headaches, hearing loss, fatigue, blurred vision, shortness of breath, chest pain, abdominal pain, problems with bowel movements, urination, or intercourse. No joint pain or mood swings.See HPI for positives. Reviewed past medical,surgical, social and family history. Reviewed medications and allergies.     Objective:   Physical Exam BP 126/90 mmHg  Pulse 80  Ht 5' (1.524 m)  Wt 126 lb 8 oz (57.38 kg)  BMI 24.71 kg/m2  LMP 07/23/2015  Breastfeeding? No UPT negative, Skin warm and dry.Pelvic: external genitalia is normal in appearance no lesions, vagina:period like blood,urethra has no lesions or masses noted, cervix:smooth and bulbous, uterus: normal size, shape and contour, non tender, no masses felt, adnexa: no masses or tenderness noted. Bladder is non tender and no masses felt.    Will try megace to get bleeding stopped, and try ambien for sleep, when does not have children.Wil get [place] and discussed that OCs are option to control periods. Face time 15 minutes.  Assessment:     Menorrhagia Sleep disturbance     Plan:     Rx megace 40 mg #45 3 x 5 days then 2 x 5 days then 1 daily with 1 refill Rx Ambien 5 mg #30 take 1 at hs prn no refills Follow up in 2 weeks for gyn [place] Review handout on menorrhagia

## 2015-07-26 NOTE — Patient Instructions (Signed)

## 2015-08-10 ENCOUNTER — Ambulatory Visit (INDEPENDENT_AMBULATORY_CARE_PROVIDER_SITE_OTHER): Payer: BC Managed Care – PPO

## 2015-08-10 DIAGNOSIS — D259 Leiomyoma of uterus, unspecified: Secondary | ICD-10-CM

## 2015-08-10 DIAGNOSIS — N920 Excessive and frequent menstruation with regular cycle: Secondary | ICD-10-CM

## 2015-08-10 DIAGNOSIS — Q514 Unicornate uterus: Secondary | ICD-10-CM

## 2015-08-10 DIAGNOSIS — R1031 Right lower quadrant pain: Secondary | ICD-10-CM

## 2015-08-10 NOTE — Progress Notes (Signed)
Pelvic US today. Unicornuate uterus measuring 8.1 x 5.7 x 7.7cm. Multiple fibroids (3) with largest measuring 3.1 x 2.9 x 3.8 cm. Endometrium measures 9.9 mm.  Bilateral ovaries appear normal and mobile. No free fluid noted. Pain noted on right during ultrasound.

## 2015-08-14 ENCOUNTER — Encounter: Payer: Self-pay | Admitting: Adult Health

## 2015-08-14 ENCOUNTER — Telehealth: Payer: Self-pay | Admitting: Adult Health

## 2015-08-14 DIAGNOSIS — D259 Leiomyoma of uterus, unspecified: Secondary | ICD-10-CM

## 2015-08-14 DIAGNOSIS — D219 Benign neoplasm of connective and other soft tissue, unspecified: Secondary | ICD-10-CM | POA: Insufficient documentation

## 2015-08-14 HISTORY — DX: Benign neoplasm of connective and other soft tissue, unspecified: D21.9

## 2015-08-14 NOTE — Telephone Encounter (Signed)
Left message US showed 3 fibroids, ovaries looked normal, hope megace stopped the bleeding call with any questions or concerns

## 2015-09-01 ENCOUNTER — Telehealth: Payer: Self-pay | Admitting: Nurse Practitioner

## 2015-09-01 ENCOUNTER — Ambulatory Visit (INDEPENDENT_AMBULATORY_CARE_PROVIDER_SITE_OTHER): Payer: BC Managed Care – PPO | Admitting: Nurse Practitioner

## 2015-09-01 ENCOUNTER — Encounter: Payer: Self-pay | Admitting: Family Medicine

## 2015-09-01 ENCOUNTER — Encounter: Payer: Self-pay | Admitting: Nurse Practitioner

## 2015-09-01 VITALS — BP 110/74 | Temp 98.4°F | Ht 59.0 in | Wt 126.1 lb

## 2015-09-01 DIAGNOSIS — J069 Acute upper respiratory infection, unspecified: Secondary | ICD-10-CM

## 2015-09-01 DIAGNOSIS — M6248 Contracture of muscle, other site: Secondary | ICD-10-CM | POA: Diagnosis not present

## 2015-09-01 DIAGNOSIS — M62838 Other muscle spasm: Secondary | ICD-10-CM

## 2015-09-01 MED ORDER — AMOXICILLIN 500 MG PO CAPS: 500.0000 mg | ORAL_CAPSULE | Freq: Three times a day (TID) | ORAL | Status: DC

## 2015-09-01 NOTE — Patient Instructions (Addendum)
Align as directed Ice/heat applications Lidocaine patches NSAID Massage therapist TENS unit (Icy hot smart relief)

## 2015-09-01 NOTE — Telephone Encounter (Signed)
Pt was seen today and was told by Hoyle Sauer that she would send in the pt a muscle relaxer. Pharmacy has not received it and it is not in the system.     [place]

## 2015-09-02 MED ORDER — CHLORZOXAZONE 500 MG PO TABS: 500.0000 mg | ORAL_TABLET | Freq: Three times a day (TID) | ORAL | Status: DC | PRN

## 2015-09-02 NOTE — Telephone Encounter (Signed)
done

## 2015-09-04 ENCOUNTER — Encounter: Payer: Self-pay | Admitting: Nurse Practitioner

## 2015-09-04 NOTE — Progress Notes (Signed)
Subjective:  Presents for complaints of sore throat low-grade fever and right ear pain that began about a week ago. Minimal cough. Sinus pressure. No wheezing. Also having tenderness along the neck and upper back area at times as a long-term problem. No vomiting or abdominal pain. Has had diarrhea a few times per day with illness. Is no longer breast-feeding.  Objective:   BP 110/74 mmHg  Temp(Src) 98.4 F (36.9 C) (Oral)  Ht 4\' 11"  (1.499 m)  Wt 126 lb 2 oz (57.21 kg)  BMI 25.46 kg/m2 NAD. Alert, oriented. TMs clear effusion, no erythema. Pharynx nonerythematous with PND noted. Neck supple with mild soft anterior adenopathy. Lungs clear. Heart regular rate rhythm. Abdomen soft nontender.  Assessment: Acute upper respiratory infection  Muscle spasms of neck  Plan:  Meds ordered this encounter  Medications  . amoxicillin (AMOXIL) 500 MG capsule    Sig: Take 1 capsule (500 mg total) by mouth 3 (three) times daily.    Dispense:  30 capsule    Refill:  0    Order Specific Question:  Supervising Provider    Answer:  Mikey Kirschner [[id number]]  . chlorzoxazone (PARAFON) 500 MG tablet    Sig: Take 1 tablet (500 mg total) by mouth 3 (three) times daily as needed for muscle spasms.    Dispense:  30 tablet    Refill:  0    Order Specific Question:  Supervising Provider    Answer:  Mikey Kirschner [[id number]]   OTC meds as directed for congestion. DC antibiotic and call if significant worsening of diarrhea. Align as directed Ice/heat applications Lidocaine patches NSAID Massage therapist TENS unit (Icy hot smart relief) Call back if any symptoms worsen or persist.

## 2015-09-06 ENCOUNTER — Telehealth: Payer: Self-pay | Admitting: Family Medicine

## 2015-09-06 MED ORDER — HYDROCODONE-HOMATROPINE 5-1.5 MG/5ML PO SYRP: ORAL_SOLUTION | ORAL | Status: DC

## 2015-09-06 NOTE — Telephone Encounter (Signed)
Pt wants hycodan cough syrup. Would like to pick up today.

## 2015-09-06 NOTE — Telephone Encounter (Signed)
Ok, may have 3 oz, 1 tsp qhs prn cough

## 2015-09-06 NOTE — Telephone Encounter (Signed)
Pt called stating that she is feeling better and now has a bad cough and is not getting and sleep cause of it. Pt wants to know if something can be called in for it.     [place]

## 2015-09-06 NOTE — Telephone Encounter (Signed)
Choices are Tessalon Perles one 3 times a day when necessary 100 mg #21-does not cause drowsiness or Delsym OTC. I do not recommend Hycodan because patient has hydrocodone on her allergy list

## 2015-09-06 NOTE — Telephone Encounter (Signed)
[facility] 09/06/15

## 2015-09-07 NOTE — Telephone Encounter (Signed)
Patient picked up rx 09/06/15

## 2015-10-31 ENCOUNTER — Other Ambulatory Visit: Payer: Self-pay | Admitting: Adult Health

## 2015-12-06 ENCOUNTER — Encounter: Payer: Self-pay | Admitting: Nurse Practitioner

## 2015-12-06 ENCOUNTER — Ambulatory Visit (INDEPENDENT_AMBULATORY_CARE_PROVIDER_SITE_OTHER): Payer: BC Managed Care – PPO | Admitting: Nurse Practitioner

## 2015-12-06 VITALS — BP 118/78 | Ht 59.0 in | Wt 131.0 lb

## 2015-12-06 DIAGNOSIS — R631 Polydipsia: Secondary | ICD-10-CM | POA: Diagnosis not present

## 2015-12-06 LAB — POCT GLUCOSE (DEVICE FOR HOME USE): POC Glucose: 110 mg/dl — AB (ref 70–99)

## 2015-12-06 LAB — POCT GLYCOSYLATED HEMOGLOBIN (HGB A1C): Hemoglobin A1C: 5.4

## 2015-12-07 ENCOUNTER — Encounter: Payer: Self-pay | Admitting: Nurse Practitioner

## 2015-12-07 NOTE — Progress Notes (Signed)
Subjective:  Presents to discuss spells she was having recently. 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 Was not eating properly. Maybe 1-2 times per day. Having blurred vision, increased thirst, night sweats and nausea during the night, drank some apple juice and symptoms resolved. Had gestational diabetes, this worked for her during her last pregnancy. School year just recently ended a few days ago, has not had any episodes since. Eating 3 meals plus snacks every day now. Has a chronic history of sleep disturbance with frequent awakenings. States she is high strong and has a hard time relaxing. Has tried natural supplements such as melatonin with no relief.  Objective:   BP 118/78 mmHg  Ht 4\' 11"  (1.499 m)  Wt 131 lb (59.421 kg)  BMI 26.44 kg/m2 NAD. Alert, oriented. Lungs clear. Heart regular rate rhythm.  Results for orders placed or performed in visit on 12/06/15  POCT Glucose (Device for Home Use)  Result Value Ref Range   Glucose nonFasting, POC  70 - 99 mg/dL   POC Glucose 110 (A) 70 - 99 mg/dl  POCT glycosylated hemoglobin (Hb A1C)  Result Value Ref Range   Hemoglobin A1C 5.4      Assessment: Polydipsia - Plan: POCT Glucose (Device for Home Use), POCT glycosylated hemoglobin (Hb A1C)  Plan: Reassured that nonfasting glucose and A1c are within normal range. Feel that several things came into playing to cause these episodes including increased stress, poor eating habits and lack of sleep. Encouraged regular healthy meals plus snacks and adequate rest. Discussed importance of stress reduction. Feel this is extremely important 1 she starts back to work in the fall. Wait to see how she does this summer, call back if further episodes.Defers medication at this time for sleep issues.

## 2015-12-29 ENCOUNTER — Encounter: Payer: Self-pay | Admitting: Family Medicine

## 2015-12-29 ENCOUNTER — Ambulatory Visit (INDEPENDENT_AMBULATORY_CARE_PROVIDER_SITE_OTHER): Payer: BC Managed Care – PPO | Admitting: Family Medicine

## 2015-12-29 VITALS — BP 116/74 | Ht 59.0 in | Wt 130.4 lb

## 2015-12-29 DIAGNOSIS — M5431 Sciatica, right side: Secondary | ICD-10-CM | POA: Diagnosis not present

## 2015-12-29 MED ORDER — MELOXICAM 15 MG PO TABS: 15.0000 mg | ORAL_TABLET | Freq: Every day | ORAL | Status: DC

## 2015-12-29 MED ORDER — TRAMADOL HCL 50 MG PO TABS
50.0000 mg | ORAL_TABLET | Freq: Three times a day (TID) | ORAL | Status: DC | PRN
Start: 2015-12-29 — End: 2016-07-15

## 2015-12-29 NOTE — Progress Notes (Signed)
   Subjective:    Patient ID: [name], female    DOB: 07-Oct-1980, 35 y.o.   MRN: [id number]  Back Pain This is a recurrent problem. The current episode started more than 1 month ago. The pain is present in the lumbar spine. The pain radiates to the right thigh. The symptoms are aggravated by bending, lying down and sitting. Associated symptoms include leg pain and tingling. Treatments tried: Ibuprofen.    Patient states no other concerns this visit.  Review of Systems  Musculoskeletal: Positive for back pain.  Neurological: Positive for tingling.       Objective:   Physical Exam  Patient can walk on her toes and heels without difficulty No muscle weakness currently relates moderate pain discomfort down the right leg low back pain as well reflexes good lungs clear heart regular     Assessment & Plan:  Right leg sciatica on the importance of taking the medication stretches was discussed on off on steroids currently hopefully tramadol will help with pain hopefully will gradually get better without having to do any imaging follow-up in 4 weeks if not dramatically better within 8 weeks MRI

## 2016-01-12 ENCOUNTER — Other Ambulatory Visit: Payer: Self-pay | Admitting: *Deleted

## 2016-01-15 ENCOUNTER — Telehealth: Payer: Self-pay | Admitting: Adult Health

## 2016-01-15 MED ORDER — ALPRAZOLAM 0.5 MG PO TABS: 0.5000 mg | ORAL_TABLET | Freq: Two times a day (BID) | ORAL | 1 refills | Status: DC | PRN

## 2016-01-15 NOTE — Telephone Encounter (Signed)
Pt called stating that she needs a refill of her xanax's medication. Pt states that her pharmacy has sent over the request on Friday. Please contact pt

## 2016-01-15 NOTE — Telephone Encounter (Signed)
Spoke with pt letting her know Xanax was sent to pharmacy. Pt voiced understanding. [place]

## 2016-01-15 NOTE — Telephone Encounter (Signed)
Pt requesting refill on Xanax 0.5 mg tablet po bid as needed for anxiety. Please advise.

## 2016-01-29 ENCOUNTER — Ambulatory Visit: Payer: BC Managed Care – PPO | Admitting: Family Medicine

## 2016-03-12 ENCOUNTER — Other Ambulatory Visit: Payer: Self-pay | Admitting: *Deleted

## 2016-03-12 MED ORDER — ALPRAZOLAM 0.5 MG PO TABS: 0.5000 mg | ORAL_TABLET | Freq: Two times a day (BID) | ORAL | 1 refills | Status: DC | PRN

## 2016-04-02 ENCOUNTER — Ambulatory Visit: Payer: BC Managed Care – PPO

## 2016-04-11 ENCOUNTER — Ambulatory Visit: Payer: BC Managed Care – PPO

## 2016-04-12 ENCOUNTER — Telehealth: Payer: Self-pay | Admitting: Family Medicine

## 2016-04-12 ENCOUNTER — Emergency Department (HOSPITAL_COMMUNITY)
Admission: EM | Admit: 2016-04-12 | Discharge: 2016-04-13 | Disposition: A | Discharge status: Home / Self Care | Payer: BC Managed Care – PPO | Attending: Emergency Medicine | Admitting: Emergency Medicine

## 2016-04-12 ENCOUNTER — Encounter (HOSPITAL_COMMUNITY): Payer: Self-pay

## 2016-04-12 DIAGNOSIS — R109 Unspecified abdominal pain: Secondary | ICD-10-CM | POA: Diagnosis present

## 2016-04-12 DIAGNOSIS — I10 Essential (primary) hypertension: Secondary | ICD-10-CM | POA: Diagnosis not present

## 2016-04-12 DIAGNOSIS — K5790 Diverticulosis of intestine, part unspecified, without perforation or abscess without bleeding: Secondary | ICD-10-CM | POA: Insufficient documentation

## 2016-04-12 DIAGNOSIS — J45909 Unspecified asthma, uncomplicated: Secondary | ICD-10-CM | POA: Insufficient documentation

## 2016-04-12 DIAGNOSIS — N201 Calculus of ureter: Secondary | ICD-10-CM | POA: Insufficient documentation

## 2016-04-12 DIAGNOSIS — D259 Leiomyoma of uterus, unspecified: Secondary | ICD-10-CM | POA: Diagnosis not present

## 2016-04-12 LAB — URINALYSIS, ROUTINE W REFLEX MICROSCOPIC
Bilirubin Urine: NEGATIVE
Glucose, UA: NEGATIVE mg/dL
Hgb urine dipstick: NEGATIVE
Ketones, ur: NEGATIVE mg/dL
Leukocytes, UA: NEGATIVE
Nitrite: NEGATIVE
Protein, ur: 30 mg/dL — AB
Specific Gravity, Urine: 1.022 (ref 1.005–1.030)
pH: 8.5 — ABNORMAL HIGH (ref 5.0–8.0)

## 2016-04-12 LAB — URINE MICROSCOPIC-ADD ON: RBC / HPF: NONE SEEN RBC/hpf (ref 0–5)

## 2016-04-12 LAB — POC URINE PREG, ED: Preg Test, Ur: NEGATIVE

## 2016-04-12 NOTE — ED Triage Notes (Signed)
Pt here with c/o right flank pain that radiates to right lower abdomen. Started a week ago but worsened today. States pus when wiping after urinating and three episodes of vomiting today. Hx of kidney stones and lithotripsy.

## 2016-04-12 NOTE — Telephone Encounter (Signed)
History of kidney stones. Hurting in RLQ for 2 days, has seen blood in urine. Using a strainer to catch stone. Pt states she cannot take hydrocodone or oxycodone. Requesting demerol or toradol. Consult with dr Nicki Reaper. He does not prescribe either med and recommends emergency room. Discussed with pt. Pt agreed to go.

## 2016-04-12 NOTE — ED Provider Notes (Signed)
[facility] DEPT Provider Note  CSN: [id number] Arrival date & time: 04/12/16  1923  History   Chief Complaint Chief Complaint  Patient presents with  . Flank Pain    HPI [name] is a 35 y.o. female.  HPI   Patient presents to the emergency department with past medical history of anxiety, fibroids, asthma, abnormal menstrual cycle and kidney stones with complaints of right flank pain. She states that she has had stones in the past and this feels exactly the same. She says that has been going on for the past couple of days, the pain waxes and wanes. When the pain is at its worse it is unbearable she is unable to sit still or find a comfortable position. She describes it as worse than delivering a child. She is currently on her menstrual cycle. She has not had any dysuria. She is also not noticed having any vaginal discharge.  She denies having any fevers, vomiting, diarrhea, she has been nauseous. No coughing, ear pain, sore throat.  Past Medical History:  Diagnosis Date  . AMA (advanced maternal age) multigravida 35+ 05/31/2014  . Anxiety 02/01/2015  . Asthma   . Fibroids 08/14/2015  . Hypertension   . Klippel-Feil deformity    syrinx  . Menorrhagia with regular cycle 07/26/2015  . Pregnant 05/31/2014  . Sleep disturbance 07/26/2015  . Spotting 05/31/2014    Patient Active Problem List   Diagnosis Date Noted  . Fibroids 08/14/2015  . Menorrhagia with regular cycle 07/26/2015  . Sleep disturbance 07/26/2015  . 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 Anxiety 02/01/2015  . Breech presentation, single footling 12/27/2014  . Hypertension affecting pregnancy, antepartum 12/27/2014  . Gestational diabetes mellitus, antepartum 12/27/2014  . S/P cesarean section 12/27/2014  . 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 Short cervix affecting pregnancy 11/04/2014  . Hx of preterm delivery, currently pregnant 11/04/2014  . ASCUS pap   07/04/2014  . Supervision of high-risk pregnancy 06/29/2014  . Uterine fibroid during pregnancy, antepartum 06/29/2014  . Previous preterm delivery, antepartum 06/29/2014  . AMA (advanced maternal age) multigravida 35+ 05/31/2014  . Asthma   . Hypertension     Past Surgical History:  Procedure Laterality Date  . CESAREAN SECTION N/A 12/27/2014   Procedure: CESAREAN SECTION;  Surgeon: Osborne Oman, MD;  Location: [facility];  Service: Obstetrics;  Laterality: N/A;  . CHOLECYSTECTOMY    . DILATION AND CURETTAGE OF UTERUS      OB History    Gravida Para Term Preterm AB Living   3 2 1 1 1 2    SAB TAB Ectopic Multiple Live Births   1     0 2       Home Medications    Prior to Admission medications   Medication Sig Start Date End Date Taking? Authorizing Provider  albuterol (PROVENTIL HFA;VENTOLIN HFA) 108 (90 Base) MCG/ACT inhaler Inhale 1-2 puffs into the lungs every 6 (six) hours as needed for wheezing or shortness of breath.   Yes Historical Provider, MD  ALPRAZolam Duanne Moron) 0.5 MG tablet Take 1 tablet (0.5 mg total) by mouth 2 (two) times daily as needed. for anxiety 03/12/16  Yes Estill Dooms, NP  meloxicam (MOBIC) 15 MG tablet Take 1 tablet (15 mg total) by mouth daily. Patient taking differently: Take 15 mg by mouth daily as needed for pain.  12/29/15  Yes Kathyrn Drown, MD  traMADol (ULTRAM) 50 MG tablet Take 1 tablet (50 mg total)  by mouth every 8 (eight) hours as needed. Patient taking differently: Take 50 mg by mouth every 8 (eight) hours as needed for moderate pain.  12/29/15  Yes Kathyrn Drown, MD  ondansetron (ZOFRAN ODT) 4 MG disintegrating tablet Take 1 tablet (4 mg total) by mouth every 8 (eight) hours as needed for nausea or vomiting. 04/13/16   Donavyn Fecher Carlota Raspberry, PA-C  traMADol (ULTRAM) 50 MG tablet Take 1 tablet (50 mg total) by mouth every 6 (six) hours as needed. 04/13/16   Delos Haring, PA-C    Family History Family History  Problem Relation Age of Onset    . Diabetes Paternal Grandfather   . Cancer Paternal Grandfather     lung  . Diabetes Paternal Grandmother   . Diabetes Maternal Grandmother   . Diabetes Maternal Grandfather   . Hypothyroidism Mother     Social History Social History  Substance Use Topics  . Smoking status: Never Smoker  . Smokeless tobacco: Never Used  . Alcohol use Yes     Comment: glass of wine occ     Allergies   Biaxin [clarithromycin]; Cefzil [cefprozil]; Ciprofloxacin; Hydrocodone; Percocet [oxycodone-acetaminophen]; Sulfa antibiotics; and Percodan [oxycodone-aspirin]   Review of Systems Review of Systems  Review of Systems All other systems negative except as documented in the HPI. All pertinent positives and negatives as reviewed in the HPI.  Physical Exam Updated Vital Signs BP 112/80   Pulse 82   Temp 98.1 F (36.7 C) (Oral)   Resp 18   Ht 4\' 11"  (1.499 m)   Wt 54.4 kg   LMP 04/12/2016   SpO2 99%   BMI 24.24 kg/m   Physical Exam  Constitutional: She appears well-developed and well-nourished. No distress.  HENT:  Head: Normocephalic and atraumatic.  Right Ear: Tympanic membrane and ear canal normal.  Left Ear: Tympanic membrane and ear canal normal.  Nose: Nose normal.  Mouth/Throat: Uvula is midline, oropharynx is clear and moist and mucous membranes are normal.  Eyes: Pupils are equal, round, and reactive to light.  Neck: Normal range of motion. Neck supple.  Cardiovascular: Normal rate and regular rhythm.   Pulmonary/Chest: Effort normal.  Abdominal: Soft. Bowel sounds are normal. There is tenderness. There is CVA tenderness (right). There is no rigidity and no guarding.  No signs of abdominal distention  Musculoskeletal:  No LE swelling  Neurological: She is alert.  Acting at baseline  Skin: Skin is warm and dry. No rash noted.  Nursing note and vitals reviewed.    ED Treatments / Results  Labs (all labs ordered are listed, but only abnormal results are  displayed) Labs Reviewed  URINALYSIS, ROUTINE W REFLEX MICROSCOPIC (NOT AT [facility]) - Abnormal; Notable for the following:       Result Value   pH 8.5 (*)    Protein, ur 30 (*)    All other components within normal limits  URINE MICROSCOPIC-ADD ON - Abnormal; Notable for the following:    Squamous Epithelial / LPF 0-5 (*)    Bacteria, UA RARE (*)    All other components within normal limits  WET PREP, GENITAL  POC URINE PREG, ED  GC/CHLAMYDIA PROBE AMP ([place]) NOT AT [facility]    EKG  EKG Interpretation None       Radiology Ct Renal Stone Study  Result Date: 04/13/2016 CLINICAL DATA:  Initial evaluation for acute right flank pain. EXAM: CT ABDOMEN AND PELVIS WITHOUT CONTRAST TECHNIQUE: Multidetector CT imaging of the abdomen and pelvis was performed  following the standard protocol without IV contrast. COMPARISON:  None available. FINDINGS: Lower chest: Visualized lung bases are clear. Hepatobiliary: Limited noncontrast evaluation of the liver is unremarkable. Gallbladder surgically absent. No biliary dilatation. Pancreas: Pancreas within normal limits. Spleen: Spleen within normal limits. Adrenals/Urinary Tract: Few scattered probably punctate nonobstructive calculi noted within the kidneys bilaterally. This is most evident within the lower pole the left kidney (series 203, image 53). Subtle asymmetric fullness of the right renal collecting system as compared to the left. No radiopaque calculus seen along the course of the right renal collecting system. Finding raises the possibility for 80 recently passed stone. No layering stones identified within the bladder lumen. No left-sided hydronephrosis or hydroureter. Stomach/Bowel: Stomach within normal limits. No evidence for bowel obstruction. Appendix is normal. Colonic diverticulosis without evidence for acute diverticulitis. No acute inflammatory changes identified about the bowels. Vascular/Lymphatic: No acute vascular abnormality on this  noncontrast examination. No adenopathy. Reproductive: Calcified fibroid noted within the uterus. Uterus and ovaries otherwise unremarkable. Other: No free air or fluid. Musculoskeletal: Scoliosis noted. No acute osseous abnormality. No worrisome lytic or blastic osseous lesions. IMPRESSION: 1. Subtle asymmetric fullness within the right renal collecting system as compared to the left, with no obstructive right-sided radiopaque calculi identified. Given the provided history of right flank pain, finding raises the possibility for a recently passed stone. Possible infection could also be considered. Correlation with urinalysis recommended. 2. Punctate nonobstructive bilateral renal calculi. 3. Colonic diverticulosis without evidence for acute diverticulitis. 4. Fibroid uterus. 5. Status post cholecystectomy. Electronically Signed   By: Jeannine Boga M.D.   On: 04/13/2016 03:24    Procedures Procedures (including critical care time)  Medications Ordered in ED Medications  sodium chloride 0.9 % bolus 1,000 mL (0 mLs Intravenous Stopped 04/13/16 0347)  ketorolac (TORADOL) 30 MG/ML injection 30 mg (30 mg Intravenous Given 04/13/16 0132)  morphine 4 MG/ML injection 4 mg (4 mg Intravenous Given 04/13/16 0132)  ondansetron (ZOFRAN) injection 4 mg (4 mg Intravenous Given 04/13/16 0132)  ondansetron (ZOFRAN) injection 4 mg (4 mg Intravenous Given 04/13/16 0258)     Initial Impression / Assessment and Plan / ED Course  I have reviewed the triage vital signs and the nursing notes.  Pertinent labs & imaging results that were available during my care of the patient were reviewed by me and considered in my medical decision making (see chart for details).  Clinical Course    Patients Pain is currently managed. It appears that she has passed the stone on the right, she has not had any pain since last using the restroom. She does have diverticulosis without signs of diverticulitis as well as uterine  fibroids, which she was recently with. She sees Dr. Wolfgang Phoenix as a primary care provider and will follow-up with him. Discussed return precautions. Will write for a small prescription of nausea and pain medication.  Final Clinical Impressions(s) / ED Diagnoses   Final diagnoses:  Acute right flank pain  Ureteral stone  Uterine leiomyoma, unspecified location  Diverticulosis of intestine without bleeding, unspecified intestinal tract location    New Prescriptions New Prescriptions   ONDANSETRON (ZOFRAN ODT) 4 MG DISINTEGRATING TABLET    Take 1 tablet (4 mg total) by mouth every 8 (eight) hours as needed for nausea or vomiting.   TRAMADOL (ULTRAM) 50 MG TABLET    Take 1 tablet (50 mg total) by mouth every 6 (six) hours as needed.     Delos Haring, PA-C 04/13/16 [phone]  Ripley Fraise, MD 04/13/16 [phone]

## 2016-04-12 NOTE — Telephone Encounter (Signed)
Pt has a kidney stone and is in a lot of pain. Pt states that she has had them on and off since she was 60. Pt is only needing something to dull the pain. Can something be given to the pt or does she need to be seen? Please advise.    [place]

## 2016-04-13 ENCOUNTER — Emergency Department (HOSPITAL_COMMUNITY): Payer: BC Managed Care – PPO

## 2016-04-13 MED ORDER — MORPHINE SULFATE (PF) 4 MG/ML IV SOLN
4.0000 mg | Freq: Once | INTRAVENOUS | Status: AC
  Administered 2016-04-13: 4 mg via INTRAVENOUS
  Filled 2016-04-13: qty 1

## 2016-04-13 MED ORDER — ONDANSETRON 4 MG PO TBDP: 4.0000 mg | ORAL_TABLET | Freq: Three times a day (TID) | ORAL | 0 refills | Status: DC | PRN

## 2016-04-13 MED ORDER — TRAMADOL HCL 50 MG PO TABS: 50.0000 mg | ORAL_TABLET | Freq: Four times a day (QID) | ORAL | 0 refills | Status: DC | PRN

## 2016-04-13 MED ORDER — SODIUM CHLORIDE 0.9 % IV BOLUS (SEPSIS)
1000.0000 mL | Freq: Once | INTRAVENOUS | Status: AC
  Administered 2016-04-13: 1000 mL via INTRAVENOUS

## 2016-04-13 MED ORDER — KETOROLAC TROMETHAMINE 30 MG/ML IJ SOLN
30.0000 mg | Freq: Once | INTRAMUSCULAR | Status: AC
  Administered 2016-04-13: 30 mg via INTRAVENOUS
  Filled 2016-04-13: qty 1

## 2016-04-13 MED ORDER — ONDANSETRON HCL 4 MG/2ML IJ SOLN
4.0000 mg | Freq: Once | INTRAMUSCULAR | Status: AC
  Administered 2016-04-13: 4 mg via INTRAVENOUS
  Filled 2016-04-13: qty 2

## 2016-04-30 ENCOUNTER — Ambulatory Visit (INDEPENDENT_AMBULATORY_CARE_PROVIDER_SITE_OTHER): Payer: BC Managed Care – PPO | Admitting: Family Medicine

## 2016-04-30 ENCOUNTER — Encounter: Payer: Self-pay | Admitting: Family Medicine

## 2016-04-30 VITALS — BP 110/72 | Temp 98.4°F | Ht 59.0 in | Wt 130.2 lb

## 2016-04-30 DIAGNOSIS — J219 Acute bronchiolitis, unspecified: Secondary | ICD-10-CM

## 2016-04-30 DIAGNOSIS — J329 Chronic sinusitis, unspecified: Secondary | ICD-10-CM | POA: Diagnosis not present

## 2016-04-30 DIAGNOSIS — J310 Chronic rhinitis: Secondary | ICD-10-CM

## 2016-04-30 MED ORDER — PROMETHAZINE HCL 25 MG PO TABS: 25.0000 mg | ORAL_TABLET | Freq: Three times a day (TID) | ORAL | 0 refills | Status: DC | PRN

## 2016-04-30 MED ORDER — HYDROCODONE-HOMATROPINE 5-1.5 MG/5ML PO SYRP: 5.0000 mL | ORAL_SOLUTION | Freq: Every evening | ORAL | 0 refills | Status: DC | PRN

## 2016-04-30 MED ORDER — AMOXICILLIN-POT CLAVULANATE 875-125 MG PO TABS: 1.0000 | ORAL_TABLET | Freq: Two times a day (BID) | ORAL | 0 refills | Status: AC

## 2016-04-30 NOTE — Progress Notes (Signed)
   Subjective:    Patient ID: [name], female    DOB: August 25, 1980, 35 y.o.   MRN: [id number]  Cough  This is a new problem. The current episode started in the past 7 days. Associated symptoms include headaches, nasal congestion and a sore throat. Associated symptoms comments: Vomiting, diarrhea, abdominal pain. Treatments tried: robitussin.   Vomiting with coughing  diarhea    Dim appetite   A lot of pain in the flank   Pos hx of kidney stone into r ight side thre wks ago   Pos chills  Dry cough  Three r four d ago     Review of Systems  HENT: Positive for sore throat.   Respiratory: Positive for cough.   Neurological: Positive for headaches.       Objective:   Physical Exam  Alert no acute distress. H&T moderate nasal congestion. Pharynx normal lungs some bronchial sounds no true wheezes heart regular in rhythm abdomen diffuse pain in multiple regions excellent bowel sounds      Assessment & Plan:  Impression post viral sinusitis/bronchitis with substantial cough and GI symptomatology. Recent stones but pain does not sound at all like kidney stones. Antibiotics prescribed. Cough medicine and nausea medicine prescribed. Symptom care discussed WSL

## 2016-05-13 ENCOUNTER — Other Ambulatory Visit: Payer: Self-pay | Admitting: *Deleted

## 2016-05-14 MED ORDER — ALPRAZOLAM 0.5 MG PO TABS: 0.5000 mg | ORAL_TABLET | Freq: Two times a day (BID) | ORAL | 1 refills | Status: DC | PRN

## 2016-06-03 ENCOUNTER — Telehealth: Payer: Self-pay | Admitting: Family Medicine

## 2016-06-03 NOTE — Telephone Encounter (Signed)
Nurses please discuss the situation with her. There are medications that can be used to help with sleep. It does depend if the children are with her or not. As you are aware Ambien can help with sleep but it is best not to be in charge of taking care of children while on Ambien. Another option would be a mild nerve pill to take at bedtime for short-term use

## 2016-06-03 NOTE — Telephone Encounter (Signed)
Pt called stating that she is having a hard time falling asleep at night. Pt stated that she is unsure if it is due to the stress of the holidays or what. Pt is wanting to know if something can be called in for her. Please advise.

## 2016-06-03 NOTE — Telephone Encounter (Signed)
Patient states that she wants the Ambien. She will not take it when she has the children. She is recently divorced and a lot of times the kids are with dad also.

## 2016-06-03 NOTE — Telephone Encounter (Signed)
Ambien 5 mg one daily at bedtime when necessary insomnia-must devote 8 hours to sleep, #30, 1 refill

## 2016-06-04 ENCOUNTER — Other Ambulatory Visit: Payer: Self-pay | Admitting: *Deleted

## 2016-06-04 MED ORDER — ZOLPIDEM TARTRATE 5 MG PO TABS: 5.0000 mg | ORAL_TABLET | Freq: Every evening | ORAL | 1 refills | Status: DC | PRN

## 2016-06-04 NOTE — Telephone Encounter (Signed)
Discussed with pt. Med sent to pharm.  

## 2016-06-26 ENCOUNTER — Other Ambulatory Visit: Payer: Self-pay | Admitting: Adult Health

## 2016-07-15 ENCOUNTER — Ambulatory Visit (INDEPENDENT_AMBULATORY_CARE_PROVIDER_SITE_OTHER): Payer: BC Managed Care – PPO | Admitting: Obstetrics and Gynecology

## 2016-07-15 ENCOUNTER — Encounter: Payer: Self-pay | Admitting: Obstetrics and Gynecology

## 2016-07-15 ENCOUNTER — Telehealth: Payer: Self-pay | Admitting: Adult Health

## 2016-07-15 VITALS — BP 142/90 | HR 83 | Wt 129.8 lb

## 2016-07-15 DIAGNOSIS — F4320 Adjustment disorder, unspecified: Secondary | ICD-10-CM | POA: Diagnosis not present

## 2016-07-15 MED ORDER — ALPRAZOLAM 0.5 MG PO TABS: ORAL_TABLET | ORAL | 4 refills | Status: DC

## 2016-07-15 NOTE — Telephone Encounter (Signed)
Spoke with pt. Pt was put on Xanax 0.5 mg. This has helped and she needs a refill. At last refill, JAG said needs to be seen before any more refills. Pt was advised of this and call was transferred to front desk for appt. [place]

## 2016-07-15 NOTE — Telephone Encounter (Signed)
Left message x 2. JSY 

## 2016-07-15 NOTE — Telephone Encounter (Signed)
Left message x 1. JSY 

## 2016-07-15 NOTE — Progress Notes (Addendum)
[facility] Visit  07/15/16            Patient name: [name] MRN [id number]  Date of birth: 01-14-1981  CC & HPI:  [name] is a 36 y.o. female presenting today for Xanax 0.5mg  refill. She states she takes this as needed when she feels stressed with school and personal life. She states she takes a half pill during the day and a whole pill at night for insomnia, all as needed. She states she does not take this when she has her children with her.  Used for releif after stressful day. Pt states that she does not withdraw, is able to do without prn. ROS:  ROS +anxiety  Otherwise negative Husband and pt are working things out Pertinent History Reviewed:   Reviewed: Significant for Klippel-Feil deformity Medical         Past Medical History:  Diagnosis Date  . AMA (advanced maternal age) multigravida 35+ 05/31/2014  . Anxiety 02/01/2015  . Asthma   . Fibroids 08/14/2015  . Hypertension   . Klippel-Feil deformity    syrinx  . Menorrhagia with regular cycle 07/26/2015  . Pregnant 05/31/2014  . Sleep disturbance 07/26/2015  . Spotting 05/31/2014                              Surgical Hx:    Past Surgical History:  Procedure Laterality Date  . CESAREAN SECTION N/A 12/27/2014   Procedure: CESAREAN SECTION;  Surgeon: Osborne Oman, MD;  Location: [facility];  Service: Obstetrics;  Laterality: N/A;  . CHOLECYSTECTOMY    . DILATION AND CURETTAGE OF UTERUS     Medications: Reviewed & Updated - see associated section                       Current Outpatient Prescriptions:  .  ALPRAZolam (XANAX) 0.5 MG tablet, TAKE ONE TABLET TWICE DAILY AS NEEDED FOR ANXIETY, Disp: 30 tablet, Rfl: 0 .  albuterol (PROVENTIL HFA;VENTOLIN HFA) 108 (90 Base) MCG/ACT inhaler, Inhale 1-2 puffs into the lungs every 6 (six) hours as needed for wheezing or shortness of breath., Disp: , Rfl:  .  HYDROcodone-homatropine (HYCODAN) 5-1.5 MG/5ML syrup, Take 5 mLs by mouth at bedtime as needed  for cough. (Patient not taking: Reported on 07/15/2016), Disp: 120 mL, Rfl: 0 .  meloxicam (MOBIC) 15 MG tablet, Take 1 tablet (15 mg total) by mouth daily. (Patient not taking: Reported on 07/15/2016), Disp: 30 tablet, Rfl: 2 .  ondansetron (ZOFRAN ODT) 4 MG disintegrating tablet, Take 1 tablet (4 mg total) by mouth every 8 (eight) hours as needed for nausea or vomiting. (Patient not taking: Reported on 07/15/2016), Disp: 20 tablet, Rfl: 0 .  promethazine (PHENERGAN) 25 MG tablet, Take 1 tablet (25 mg total) by mouth every 8 (eight) hours as needed for nausea or vomiting. (Patient not taking: Reported on 07/15/2016), Disp: 28 tablet, Rfl: 0 .  traMADol (ULTRAM) 50 MG tablet, Take 1 tablet (50 mg total) by mouth every 8 (eight) hours as needed. (Patient not taking: Reported on 07/15/2016), Disp: 30 tablet, Rfl: 0 .  traMADol (ULTRAM) 50 MG tablet, Take 1 tablet (50 mg total) by mouth every 6 (six) hours as needed. (Patient not taking: Reported on 07/15/2016), Disp: 15 tablet, Rfl: 0 .  zolpidem (AMBIEN) 5 MG tablet, Take 1 tablet (5 mg total) by mouth at bedtime as needed for  sleep. (Patient not taking: Reported on 07/15/2016), Disp: 30 tablet, Rfl: 1   Social History: Reviewed -  reports that she has never smoked. She has never used smokeless tobacco.  Objective Findings:  Vitals: Blood pressure (!) 142/90, pulse 83, weight 129 lb 12.8 oz (58.9 kg), last menstrual period 06/14/2016, not currently breastfeeding.  Physical Examination: discussion only, not indicated at this time Alert oriented aware of need to avoid overutiliization of benzodiazepine.   Assessment & Plan:   A:  1. Situational stress  P:  1. Refill Xanax 0.5 mg  .5 tab q day. 2. Follow up PRN   By signing my name below, I, Sonum Patel, attest that this documentation has been prepared under the direction and in the presence of Mallory Shirk, MD. Electronically Signed: Sonum Patel, Education administrator. 07/15/16. 4:33 PM.  I personally  performed the services described in this documentation, which was SCRIBED in my presence. The recorded information has been reviewed and considered accurate. It has been edited as necessary during review. Jonnie Kind, MD

## 2016-07-16 ENCOUNTER — Ambulatory Visit: Payer: BC Managed Care – PPO | Admitting: Advanced Practice Midwife

## 2016-08-15 ENCOUNTER — Encounter: Payer: Self-pay | Admitting: Nurse Practitioner

## 2016-08-15 ENCOUNTER — Ambulatory Visit (INDEPENDENT_AMBULATORY_CARE_PROVIDER_SITE_OTHER): Payer: BC Managed Care – PPO | Admitting: Nurse Practitioner

## 2016-08-15 ENCOUNTER — Encounter: Payer: Self-pay | Admitting: Family Medicine

## 2016-08-15 VITALS — Temp 98.6°F | Ht 59.0 in | Wt 127.2 lb

## 2016-08-15 DIAGNOSIS — J111 Influenza due to unidentified influenza virus with other respiratory manifestations: Secondary | ICD-10-CM | POA: Diagnosis not present

## 2016-08-15 DIAGNOSIS — A084 Viral intestinal infection, unspecified: Secondary | ICD-10-CM | POA: Diagnosis not present

## 2016-08-15 DIAGNOSIS — J029 Acute pharyngitis, unspecified: Secondary | ICD-10-CM

## 2016-08-15 LAB — POCT RAPID STREP A (OFFICE): Rapid Strep A Screen: NEGATIVE

## 2016-08-15 MED ORDER — PROMETHAZINE HCL 25 MG PO TABS: 25.0000 mg | ORAL_TABLET | Freq: Three times a day (TID) | ORAL | 0 refills | Status: DC | PRN

## 2016-08-15 MED ORDER — OSELTAMIVIR PHOSPHATE 75 MG PO CAPS: 75.0000 mg | ORAL_CAPSULE | Freq: Two times a day (BID) | ORAL | 0 refills | Status: DC

## 2016-08-16 ENCOUNTER — Encounter: Payer: Self-pay | Admitting: Nurse Practitioner

## 2016-08-16 LAB — STREP A DNA PROBE: Strep Gp A Direct, DNA Probe: NEGATIVE

## 2016-08-16 LAB — PLEASE NOTE

## 2016-08-16 NOTE — Progress Notes (Signed)
Subjective:  Presents for complaints of body aches vomiting sore throat that began last night. Max temp less than 101. Has had 2 bad headaches the last one was yesterday, none today. Occasional cough which she relates to postnasal drainage. No wheezing. Has not used her albuterol inhaler. Bilateral ear pain. Fatigue. Nausea with vomiting for the past 3 days. Is able to keep down some clear fluids, has taken a few bites of crackers. Anything else tends to cause vomiting. A few episodes of diarrhea. Minimal abdominal pain. Taking fluids well. Voiding normal limit. Just started a normal menstrual cycle 4 days ago.  Objective:   Temp 98.6 F (37 C) (Oral)   Ht 4\' 11"  (1.499 m)   Wt 127 lb 3.2 oz (57.7 kg)   BMI 25.69 kg/m  NAD. Alert, oriented. Fatigued in appearance. TMs mild clear effusion, no erythema. Pharynx moderate erythema. RST negative. Neck supple with mild soft anterior adenopathy. Lungs clear. Occasional nonproductive cough noted. Heart regular rate rhythm. Abdomen soft nondistended with mild epigastric area tenderness. Minimal generalized lower abdominal tenderness. No rebound or guarding. No obvious masses. Bowel sounds normal.  Assessment:  Acute pharyngitis, unspecified etiology - Plan: POCT rapid strep A, Strep A DNA probe  Influenza  Viral gastroenteritis    Plan:   Meds ordered this encounter  Medications  . promethazine (PHENERGAN) 25 MG tablet    Sig: Take 1 tablet (25 mg total) by mouth every 8 (eight) hours as needed for nausea or vomiting.    Dispense:  20 tablet    Refill:  0    Order Specific Question:   Supervising Provider    Answer:   Mikey Kirschner [[id number]]  . oseltamivir (TAMIFLU) 75 MG capsule    Sig: Take 1 capsule (75 mg total) by mouth 2 (two) times daily.    Dispense:  10 capsule    Refill:  0    Order Specific Question:   Supervising Provider    Answer:   Mikey Kirschner [[id number]]   Increase clear fluid intake. Gradually resume regular diet as  tolerated. Throat culture pending. Symptomatic care and warning signs reviewed per influenza gastroenteritis including signs of dehydration. Call back tomorrow if fluid intake is not improved. Go to ED over the weekend if worse.

## 2016-09-26 ENCOUNTER — Telehealth: Payer: Self-pay | Admitting: Family Medicine

## 2016-09-26 MED ORDER — ONDANSETRON 8 MG PO TBDP: 8.0000 mg | ORAL_TABLET | Freq: Three times a day (TID) | ORAL | 0 refills | Status: DC | PRN

## 2016-09-26 NOTE — Telephone Encounter (Signed)
Pt is having a problem with diarrhea and vomiting. 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 Can something be called in? Pt also wants to know ways to prevent dehydration.     [place]

## 2016-09-26 NOTE — Telephone Encounter (Signed)
Patient has options may go with Zofran either dissolvable tablet or regular tablet, 8 mg, 1 3 times a day when necessary nausea, #12 with 1 refill. Imodium OTC for diarrhea when necessary as directed 1 4 times a day when necessary, best way to prevent dehydration may use clear liquids are diluted Gatorade(2 ounces of water with every 4 ounces of Gatorade) start off with 1-2 tablespoons every 4-5 minutes as tolerated. If vomits weight 20 minutes then start over. If she is gone one hour without vomiting she may increase the amount of liquid she is taking in. Once she is able to take more fluids and then start with bland diet crackers toast dry cereal applesauce banana. Typically vomiting the last 12-24 hours diarrhea can last 1-4 days. If she needs work excuse for this week that would be fine if she gets worse we will be happy to see her. This is contagious wash hands frequently

## 2016-09-26 NOTE — Telephone Encounter (Signed)
Patient advised may go with Zofran either dissolvable tablet or regular tablet, 8 mg, 1 3 times a day when necessary nausea, #12 with 1 refill. Imodium OTC for diarrhea when necessary as directed 1 4 times a day when necessary, best way to prevent dehydration may use clear liquids are diluted Gatorade(2 ounces of water with every 4 ounces of Gatorade) start off with 1-2 tablespoons every 4-5 minutes as tolerated. If vomits weight 20 minutes then start over. If she is gone one hour without vomiting she may increase the amount of liquid she is taking in. Once she is able to take more fluids and then start with bland diet crackers toast dry cereal applesauce banana. Typically vomiting the last 12-24 hours diarrhea can last 1-4 days. If she needs work excuse for this week that would be fine if she gets worse we will be happy to see her. This is contagious wash hands frequently. Patient verbalized understanding and medication sent electronically to pharmacy.

## 2016-09-26 NOTE — Telephone Encounter (Signed)
Patient with vomiting and diarrhea

## 2016-10-29 ENCOUNTER — Other Ambulatory Visit: Payer: Self-pay

## 2016-10-29 ENCOUNTER — Telehealth: Payer: Self-pay | Admitting: Family Medicine

## 2016-10-29 MED ORDER — ZOLPIDEM TARTRATE 10 MG PO TABS: 10.0000 mg | ORAL_TABLET | Freq: Every evening | ORAL | 0 refills | Status: DC | PRN

## 2016-10-29 MED ORDER — ZOLPIDEM TARTRATE 5 MG PO TABS: 5.0000 mg | ORAL_TABLET | Freq: Every evening | ORAL | 1 refills | Status: DC | PRN

## 2016-10-29 MED ORDER — ZOLPIDEM TARTRATE 10 MG PO TABS: 10.0000 mg | ORAL_TABLET | Freq: Every evening | ORAL | 1 refills | Status: DC | PRN

## 2016-10-29 NOTE — Telephone Encounter (Signed)
For short-term use she could use Ambien 5 mg 1 daily at bedtime-caution drowsiness, #20 with 1 refill

## 2016-10-29 NOTE — Telephone Encounter (Signed)
Patient is having trouble sleeping due to EOG's coming up.  She said in the past she was prescribed sleeping sedatives and wants to know if Dr. Nicki Reaper would be willing to prescribe her another Rx.  [place]

## 2016-10-29 NOTE — Telephone Encounter (Signed)
Spoke with patient and informed her per Dr.Scott Luking-  For short term use you could use Ambien 5 mg 1 daily at bedtime. Caution drowsiness. Patient verbalized understanding.

## 2016-11-01 ENCOUNTER — Other Ambulatory Visit: Payer: Self-pay | Admitting: Obstetrics and Gynecology

## 2016-11-04 ENCOUNTER — Other Ambulatory Visit: Payer: Self-pay | Admitting: Obstetrics and Gynecology

## 2016-11-04 NOTE — Telephone Encounter (Signed)
refil alprazolam x 30 tabs refil x 1

## 2016-11-08 ENCOUNTER — Other Ambulatory Visit: Payer: Self-pay | Admitting: *Deleted

## 2016-11-08 ENCOUNTER — Telehealth: Payer: Self-pay | Admitting: Family Medicine

## 2016-11-08 MED ORDER — PROMETHAZINE HCL 25 MG PO TABS: ORAL_TABLET | ORAL | 1 refills | Status: DC

## 2016-11-08 NOTE — Telephone Encounter (Signed)
Spoke with patient and patient stated that she has vomiting, diarrhea and muscle aches and was wondering if she could have the flu. States she has not had any fever. Please advise?

## 2016-11-08 NOTE — Telephone Encounter (Signed)
Spoke with patient and informed her per Dr.Scott Luking- Although the flu can occur more than likely this is intestinal gastroenteritis. Zofran can help with the nausea. Ibuprofen or Tylenol can help with the muscle aches. Clear liquids frequently as tolerated and bland diet once vomiting subsides certainly if still running high fever with sore throat coughing head congestion and body aches would be more indicative of the flu. Patient verbalized understanding and stated that she would like Phenergan called in because Zofran does not help with nausea.

## 2016-11-08 NOTE — Telephone Encounter (Signed)
Discussed with pt. Pt verbalized understanding. Med sent to pharm.  

## 2016-11-08 NOTE — Telephone Encounter (Signed)
Although the flu can occur more than likely this is intestinal gastroenteritis. Zofran can help with the nausea. Ibuprofen or Tylenol can help with the muscle aches. Clear liquids frequently as tolerated and bland diet once vomiting subsides certainly if still running high fever with sore throat coughing head congestion and body aches would be more indicative of the flu-if the patient needs medication sent in for the vomiting we can do that

## 2016-11-08 NOTE — Telephone Encounter (Signed)
Phenergan 25 mg one half tablet to a full tablet every 12 hours when necessary nausea caution drowsiness, #20, 1 refill-do not drive while on medicine

## 2016-11-08 NOTE — Telephone Encounter (Signed)
Patient called to check on this.  She is hoping for a call back ASAP.

## 2016-11-08 NOTE — Telephone Encounter (Signed)
Patient is wanting to know if it is possible to have the flu without having a fever?

## 2016-11-29 ENCOUNTER — Encounter: Payer: Self-pay | Admitting: Nurse Practitioner

## 2016-11-29 ENCOUNTER — Ambulatory Visit (INDEPENDENT_AMBULATORY_CARE_PROVIDER_SITE_OTHER): Payer: BC Managed Care – PPO | Admitting: Nurse Practitioner

## 2016-11-29 VITALS — BP 134/84 | Temp 98.4°F | Ht 59.0 in | Wt 134.0 lb

## 2016-11-29 DIAGNOSIS — J329 Chronic sinusitis, unspecified: Secondary | ICD-10-CM | POA: Diagnosis not present

## 2016-11-29 MED ORDER — METHYLPREDNISOLONE ACETATE 40 MG/ML IJ SUSP
40.0000 mg | Freq: Once | INTRAMUSCULAR | Status: AC
  Administered 2016-11-29: 40 mg via INTRAMUSCULAR

## 2016-11-29 MED ORDER — AMOXICILLIN-POT CLAVULANATE 875-125 MG PO TABS: 1.0000 | ORAL_TABLET | Freq: Two times a day (BID) | ORAL | 0 refills | Status: DC

## 2016-11-29 MED ORDER — PREDNISONE 20 MG PO TABS: ORAL_TABLET | ORAL | 0 refills | Status: DC

## 2016-11-29 NOTE — Patient Instructions (Signed)
Rhinocort or Nasacort

## 2016-11-30 ENCOUNTER — Encounter: Payer: Self-pay | Admitting: Nurse Practitioner

## 2016-11-30 NOTE — Progress Notes (Signed)
Subjective:  Presents for pain in both ears x 2 d. Sore throat. Fatigue. Began 2 days ago. Low grade fever last night. Frontal area headache with pressure. Occasional cough. No wheezing. No relief with decongestant.   Objective:   BP 134/84   Temp 98.4 F (36.9 C) (Oral)   Ht 4\' 11"  (1.499 m)   Wt 134 lb 0.6 oz (60.8 kg)   LMP 11/14/2016 (Approximate)   BMI 27.07 kg/m  NAD. Alert, oriented. TMs significant clear effusion, no erythema. Pharynx non erythematous with PND noted. Neck supple with mild anterior adenopathy. Lungs clear. Heart RRR.   Assessment:  Rhinosinusitis - Plan: methylPREDNISolone acetate (DEPO-MEDROL) injection 40 mg    Plan:   Meds ordered this encounter  Medications  . amoxicillin-clavulanate (AUGMENTIN) 875-125 MG tablet    Sig: Take 1 tablet by mouth 2 (two) times daily.    Dispense:  20 tablet    Refill:  0    Order Specific Question:   Supervising Provider    Answer:   Mikey Kirschner [[id number]]  . predniSONE (DELTASONE) 20 MG tablet    Sig: 2 po qd x 5 d    Dispense:  10 tablet    Refill:  0    Order Specific Question:   Supervising Provider    Answer:   Mikey Kirschner [[id number]]  . methylPREDNISolone acetate (DEPO-MEDROL) injection 40 mg   Given written Rx for Prednisone to start in 48 hours if no improvement in symptoms. Start antihistamine and OTC steroid nasal spray.  Return if symptoms worsen or fail to improve.

## 2016-12-25 ENCOUNTER — Encounter: Payer: Self-pay | Admitting: Family Medicine

## 2016-12-25 ENCOUNTER — Ambulatory Visit (INDEPENDENT_AMBULATORY_CARE_PROVIDER_SITE_OTHER): Payer: BC Managed Care – PPO | Admitting: Family Medicine

## 2016-12-25 VITALS — BP 114/82 | Ht 59.0 in | Wt 136.0 lb

## 2016-12-25 DIAGNOSIS — F411 Generalized anxiety disorder: Secondary | ICD-10-CM

## 2016-12-25 DIAGNOSIS — K529 Noninfective gastroenteritis and colitis, unspecified: Secondary | ICD-10-CM | POA: Diagnosis not present

## 2016-12-25 DIAGNOSIS — F5101 Primary insomnia: Secondary | ICD-10-CM | POA: Diagnosis not present

## 2016-12-25 MED ORDER — ALPRAZOLAM 0.5 MG PO TABS: 0.5000 mg | ORAL_TABLET | Freq: Every evening | ORAL | 2 refills | Status: DC | PRN

## 2016-12-25 MED ORDER — ESCITALOPRAM OXALATE 10 MG PO TABS: 10.0000 mg | ORAL_TABLET | Freq: Every day | ORAL | 2 refills | Status: DC

## 2016-12-25 NOTE — Progress Notes (Signed)
   Subjective:    Patient ID: [name], female    DOB: [birth year], 36 y.o.   MRN: [id number]  Anxiety  Presents for initial visit. Onset was 1 to 4 weeks ago. Symptoms include insomnia.     Pt has had stomach issues and feeling anxious  Went fro school to having kids full time.  And getting reay to start new job has led to anxiety an feelg   Pt feeling trouble eating and having anxiety   And having trouble sleeping  ambien caused morn nausea   Patient has concerns of headache, not sleeping well, gets right befor e bed , with some challenges  All keyed up and anxieous a tnight  Having trouble with diminished energy and sleeping and aving day to day issues   Bldg ove pas six weeks   Xanax kind of made trouble with sleeping and having  , and diarrhea.   No major depr or feeling down, not feeling partic depressed  Pos anxiety on the family side    Review of Systems  Psychiatric/Behavioral: The patient has insomnia.        Objective:   Physical Exam Alert and oriented, vitals reviewed and stable, NAD ENT-TM's and ext canals WNL bilat via otoscopic exam Soft palate, tonsils and post pharynx WNL via oropharyngeal exam Neck-symmetric, no masses; thyroid nonpalpable and nontender Pulmonary-no tachypnea or accessory muscle use; Clear without wheezes via auscultation Card--no abnrml murmurs, rhythm reg and rate WNL Carotid pulses symmetric, without bruits        Assessment & Plan:  Impression 1 generalized anxiety disorder very long discussion held. Patient has taken Xanax in the past but did not like the way it made her feel. She does not feel that she is particularly depressed. She recognizes strong family history of anxiety. Her anxiety is starting to affect her function both in within her friends and family and also at work. Also patient having significant secondary features of loose stools diarrhea insomnia etc. Long discussion held. Initiate Lexapro  low-dose. Rationale discussed. Follow-up with her regular clinician Dr. Nicki Reaper in one month Greater than 50% of this 25 minute face to face visit was spent in counseling and discussion and coordination of care regarding the above diagnosis/diagnosies  25 .

## 2016-12-29 DIAGNOSIS — F411 Generalized anxiety disorder: Secondary | ICD-10-CM | POA: Insufficient documentation

## 2016-12-31 ENCOUNTER — Ambulatory Visit: Payer: BC Managed Care – PPO | Admitting: Adult Health

## 2017-01-01 ENCOUNTER — Ambulatory Visit: Payer: BC Managed Care – PPO | Admitting: Adult Health

## 2017-01-07 ENCOUNTER — Telehealth: Payer: Self-pay | Admitting: *Deleted

## 2017-01-07 NOTE — Telephone Encounter (Signed)
Patient called with complaints of vaginal bleeding for 14 days. States she is changing a pad an hour but is not symptomatic. Advised to keep appointment for tomorrow to be evaluated by Dr Glo Herring. Verbalized understanding.

## 2017-01-08 ENCOUNTER — Ambulatory Visit (INDEPENDENT_AMBULATORY_CARE_PROVIDER_SITE_OTHER): Payer: BC Managed Care – PPO | Admitting: Obstetrics and Gynecology

## 2017-01-08 ENCOUNTER — Encounter: Payer: Self-pay | Admitting: Obstetrics and Gynecology

## 2017-01-08 VITALS — BP 124/82 | HR 99 | Wt 135.6 lb

## 2017-01-08 DIAGNOSIS — Q514 Unicornate uterus: Secondary | ICD-10-CM | POA: Diagnosis not present

## 2017-01-08 DIAGNOSIS — N939 Abnormal uterine and vaginal bleeding, unspecified: Secondary | ICD-10-CM | POA: Diagnosis not present

## 2017-01-08 LAB — POCT HEMOGLOBIN: Hemoglobin: 13.5 g/dL (ref 12.2–16.2)

## 2017-01-08 MED ORDER — NORGESTIMATE-ETH ESTRADIOL 0.25-35 MG-MCG PO TABS: 1.0000 | ORAL_TABLET | Freq: Every day | ORAL | 11 refills | Status: DC

## 2017-01-08 NOTE — Progress Notes (Signed)
   [facility] Visit  @DATE @            Patient name: [name] MRN [id number]  Date of birth: 27-Feb-1981  CC & HPI:  [name] is a 36 y.o. female presenting today for Heavy menstrual period. 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 AMA (advanced maternal age) multigravida 35+ 05/31/2014  . Anxiety 02/01/2015  . Asthma   . Fibroids 08/14/2015  . Hypertension   . Klippel-Feil deformity    syrinx  . Menorrhagia with regular cycle 07/26/2015  . Pregnant 05/31/2014  . Sleep disturbance 07/26/2015  . Spotting 05/31/2014                              Surgical Hx:    Past Surgical History:  Procedure Laterality Date  . CESAREAN SECTION N/A 12/27/2014   Procedure: CESAREAN SECTION;  Surgeon: Osborne Oman, MD;  Location: [facility];  Service: Obstetrics;  Laterality: N/A;  . CHOLECYSTECTOMY    . DILATION AND CURETTAGE OF UTERUS     Medications: Reviewed & Updated - see associated section                       Current Outpatient Prescriptions:  .  albuterol (PROVENTIL HFA;VENTOLIN HFA) 108 (90 Base) MCG/ACT inhaler, Inhale 1-2 puffs into the lungs every 6 (six) hours as needed for wheezing or shortness of breath., Disp: , Rfl:  .  ALPRAZolam (XANAX) 0.5 MG tablet, TAKE 1 TABLET BY MOUTH TWICE DAILY AS NEEDED FOR ANXIETY, Disp: 30 tablet, Rfl: 1 .  escitalopram (LEXAPRO) 10 MG tablet, Take 1 tablet (10 mg total) by mouth daily., Disp: 30 tablet, Rfl: 2 .  ALPRAZolam (XANAX) 0.5 MG tablet, Take 1 tablet (0.5 mg total) by mouth at bedtime as needed for anxiety. (Patient not taking: Reported on 01/08/2017), Disp: 30 tablet, Rfl: 2 .  norgestimate-ethinyl estradiol  (ORTHO-CYCLEN,SPRINTEC,PREVIFEM) 0.25-35 MG-MCG tablet, Take 1 tablet by mouth daily., Disp: 1 Package, Rfl: 11   Social History: Reviewed -  reports that she has never smoked. She has never used smokeless tobacco.  Objective Findings:  Vitals: Blood pressure 124/82, pulse 99, weight 135 lb 9.6 oz (61.5 kg), last menstrual period 12/25/2016.  Physical Examination: General appearance - alert, well appearing, and in no distress Mental status - alert, oriented to person, place, and time Eyes - pupils equal and reactive, extraocular eye movements intact   Assessment & Plan:   A:  1. Menorrhagia, isolated to 1.  P:  1. Plan begin OCP use with Sprintec. We'll consider changing to Loestrin after 1 cycle

## 2017-01-13 ENCOUNTER — Encounter: Payer: Self-pay | Admitting: Obstetrics and Gynecology

## 2017-01-13 ENCOUNTER — Telehealth: Payer: Self-pay | Admitting: *Deleted

## 2017-01-13 NOTE — Telephone Encounter (Signed)
Patient called stating she took the two birth control pills Wednesday, Thursday and Friday. Dropped down to 1 pill when the bleeding stopped on Saturday as you advised and has now bleed today so heavily she had to leave school. States she doesn't know what to do now but really wants to not take the pills and have something more permanent. Please advise.

## 2017-01-13 NOTE — Telephone Encounter (Signed)
We're well in to this menses, and probably cannot completely control this bleeding episode. I am skeptical that an IUD would fit her uterus, due to the unicornuate nature of the uterus. She would need the balloon water ablation to fit the uterine shape. Ill call her tomorrow. For today, she can take a break from pills for 5 days then restart the megace. Ill call tomorrow.

## 2017-01-14 ENCOUNTER — Encounter: Payer: Self-pay | Admitting: Obstetrics and Gynecology

## 2017-01-16 ENCOUNTER — Telehealth: Payer: Self-pay | Admitting: Obstetrics and Gynecology

## 2017-01-16 NOTE — Telephone Encounter (Signed)
Patient states she took Megace last on Friday but now has a rash on her face and is still bleeding and doesn't know what to do. Informed patient that per her last conversation with Dr Glo Herring she may just need to come in to be seen. Discussed with him and will work her in tomorrow. Verbalized understanding.

## 2017-01-16 NOTE — Telephone Encounter (Signed)
Patient called stating that she would like to speak with Dr. Glo Herring or his nurse, Pt states she sent Dr. Glo Herring a mychart message regarding her medication that he placed her on. Pt states that she can not function with that medication. Pt states that she is nausea and sick. Please contact pt

## 2017-01-17 ENCOUNTER — Ambulatory Visit (INDEPENDENT_AMBULATORY_CARE_PROVIDER_SITE_OTHER): Payer: BC Managed Care – PPO | Admitting: Obstetrics and Gynecology

## 2017-01-17 ENCOUNTER — Encounter: Payer: Self-pay | Admitting: Obstetrics and Gynecology

## 2017-01-17 DIAGNOSIS — Q514 Unicornate uterus: Secondary | ICD-10-CM | POA: Diagnosis not present

## 2017-01-17 DIAGNOSIS — N921 Excessive and frequent menstruation with irregular cycle: Secondary | ICD-10-CM

## 2017-01-17 NOTE — Progress Notes (Signed)
Patient ID: CAPRICIA SERDA, female   DOB: November 28, 1980, 36 y.o.   MRN: [id number]   [facility] Visit  @DATE @            Patient name: [name] MRN [id number]  Date of birth: 09/25/1980  CC & HPI:  [name] is a 36 y.o. female presenting today for follow up in regard to scheduling surgery due to her menorrhagia with regular cycle. She is currently not sexually active. She notes that she prefers surgery prior to school starting on 8/27. Denies any other symptoms.  ROS:  ROS  +menorrhagia with regular cycle All systems are negative except as noted in the HPI and PMH.    Pertinent History Reviewed:   Reviewed: Significant for menorrhagia with regular cycle Medical         Past Medical History:  Diagnosis Date  . AMA (advanced maternal age) multigravida 35+ 05/31/2014  . Anxiety 02/01/2015  . Asthma   . Fibroids 08/14/2015  . Hypertension   . Klippel-Feil deformity    syrinx  . Menorrhagia with regular cycle 07/26/2015  . Pregnant 05/31/2014  . Sleep disturbance 07/26/2015  . Spotting 05/31/2014                              Surgical Hx:    Past Surgical History:  Procedure Laterality Date  . CESAREAN SECTION N/A 12/27/2014   Procedure: CESAREAN SECTION;  Surgeon: Osborne Oman, MD;  Location: [facility];  Service: Obstetrics;  Laterality: N/A;  . CHOLECYSTECTOMY    . DILATION AND CURETTAGE OF UTERUS     Medications: Reviewed & Updated - see associated section                       Current Outpatient Prescriptions:  .  albuterol (PROVENTIL HFA;VENTOLIN HFA) 108 (90 Base) MCG/ACT inhaler, Inhale 1-2 puffs into the lungs every 6 (six) hours as needed for wheezing or shortness of breath., Disp: , Rfl:  .  ALPRAZolam (XANAX) 0.5 MG tablet, TAKE 1 TABLET BY MOUTH TWICE DAILY AS NEEDED FOR ANXIETY (Patient not taking: Reported on 01/17/2017), Disp: 30 tablet, Rfl: 1 .  ALPRAZolam (XANAX) 0.5 MG tablet, Take 1 tablet (0.5 mg total) by mouth at bedtime as needed  for anxiety. (Patient not taking: Reported on 01/08/2017), Disp: 30 tablet, Rfl: 2 .  escitalopram (LEXAPRO) 10 MG tablet, Take 1 tablet (10 mg total) by mouth daily. (Patient not taking: Reported on 01/17/2017), Disp: 30 tablet, Rfl: 2 .  norgestimate-ethinyl estradiol (ORTHO-CYCLEN,SPRINTEC,PREVIFEM) 0.25-35 MG-MCG tablet, Take 1 tablet by mouth daily. (Patient not taking: Reported on 01/17/2017), Disp: 1 Package, Rfl: 11   Social History: Reviewed -  reports that she has never smoked. She has never used smokeless tobacco.  Objective Findings:  Vitals: Last menstrual period 12/25/2016.  Physical Examination: discussion only    Discussion: Discussed with pt risks and benefits of endometrial ablation. Advised pt that intrauterine pregnancy is nearly 100% preventable with ablation. Discussed reduced risk of ectopic pregnancy and reduced risk of ovarian cancer from 1 in 100 to 1 in 300 with bilateral salpingectomy in a lengthy conversation with risks benefits rationale and alternatives including IUD reviewed. Brochures given.   At end of discussion, pt had opportunity to ask questions and has no further questions at this time.   Specific discussion of endometrial ablation and bilateral salpingectomy as noted above. Greater than 50%  was spent in counseling and coordination of care with the patient.   Total time greater than: 15 minutes.     Assessment & Plan:   A:  1. Menorrhagia with regular cycle 2 unicornuate uterus P:  1. Schedule endometrial ablation with bilateral salpingectomy before 8/27  Discussed with [facility], and surgery is to be scheduled for August 17 at 1100.     By signing my name below, I, Margit Banda, attest that this documentation has been prepared under the direction and in the presence of Jonnie Kind, MD. Electronically Signed: Margit Banda, Medical Scribe. 01/17/17. 10:23 AM.  I personally performed the services described in this documentation, which was  SCRIBED in my presence. The recorded information has been reviewed and considered accurate. It has been edited as necessary during review. Jonnie Kind, MD

## 2017-01-20 ENCOUNTER — Encounter: Payer: Self-pay | Admitting: Obstetrics and Gynecology

## 2017-01-23 ENCOUNTER — Encounter: Payer: Self-pay | Admitting: Obstetrics and Gynecology

## 2017-01-27 ENCOUNTER — Encounter: Payer: Self-pay | Admitting: Obstetrics and Gynecology

## 2017-01-27 ENCOUNTER — Ambulatory Visit (INDEPENDENT_AMBULATORY_CARE_PROVIDER_SITE_OTHER): Payer: BC Managed Care – PPO | Admitting: Obstetrics and Gynecology

## 2017-01-27 VITALS — BP 130/86 | HR 80 | Ht 59.0 in | Wt 135.5 lb

## 2017-01-27 DIAGNOSIS — Z01818 Encounter for other preprocedural examination: Secondary | ICD-10-CM

## 2017-01-27 NOTE — Progress Notes (Signed)
Preoperative History and Physical  [name] is a 36 y.o. [id number] here for surgical management of menorrhagia with regular cycle.   No significant preoperative concerns. Lengthy discussions have been conducted already explaining the HTA endometrial ablation process with this risks benefits total concerns including the risk of thermal injury. The patient understands this and is comfortable with the planned surgery. We will do the endometrial ablation first and then 2 laparoscopy for the salpingectomy to follow.  Proposed surgery: Hysteroscopy, D&C, endometrial ablation with bilateral salpingectomy.  Past Medical History:  Diagnosis Date  . AMA (advanced maternal age) multigravida 35+ 05/31/2014  . Anxiety 02/01/2015  . Asthma   . Fibroids 08/14/2015  . Hypertension   . Klippel-Feil deformity    syrinx  . Menorrhagia with regular cycle 07/26/2015  . Pregnant 05/31/2014  . Sleep disturbance 07/26/2015  . Spotting 05/31/2014   Past Surgical History:  Procedure Laterality Date  . CESAREAN SECTION N/A 12/27/2014   Procedure: CESAREAN SECTION;  Surgeon: Osborne Oman, MD;  Location: [facility];  Service: Obstetrics;  Laterality: N/A;  . CHOLECYSTECTOMY    . DILATION AND CURETTAGE OF UTERUS     OB History  Gravida Para Term Preterm AB Living  3 2 1 1 1 2   SAB TAB Ectopic Multiple Live Births  1     0 2    # Outcome Date GA Lbr Len/2nd Weight Sex Delivery Anes PTL Lv  3 Term 12/27/14 [redacted]w[redacted]d  5 lb 15.6 oz (2.71 kg) F CS-LTranv Spinal  LIV  2 Preterm 11/25/09 [redacted]w[redacted]d  5 lb 9 oz (2.523 kg) F Vag-Spont EPI Y LIV  1 SAB  [redacted]w[redacted]d           Patient denies any other pertinent gynecologic issues.   Current Outpatient Prescriptions on File Prior to Visit  Medication Sig Dispense Refill  . albuterol (PROVENTIL HFA;VENTOLIN HFA) 108 (90 Base) MCG/ACT inhaler Inhale 1-2 puffs into the lungs every 6 (six) hours as needed for wheezing or shortness of breath.    . ALPRAZolam (XANAX) 0.5 MG tablet Take  1 tablet (0.5 mg total) by mouth at bedtime as needed for anxiety. (Patient taking differently: Take 0.25-0.5 mg by mouth at bedtime as needed for anxiety. ) 30 tablet 2   No current facility-administered medications on file prior to visit.    Allergies  Allergen Reactions  . Biaxin [Clarithromycin] Hives  . Cefzil [Cefprozil] Nausea Only  . Ciprofloxacin Hives  . Hydrocodone Other (See Comments)    Causes patient to feel wired  . Percocet [Oxycodone-Acetaminophen] Other (See Comments)    Causes patient to feel wired  . Sulfa Antibiotics Hives  . Percodan [Oxycodone-Aspirin] Anxiety    Social History:   reports that she has never smoked. She has never used smokeless tobacco. She reports that she drinks alcohol. She reports that she does not use drugs.  Family History  Problem Relation Age of Onset  . Diabetes Paternal Grandfather   . Cancer Paternal Grandfather        lung  . Diabetes Paternal Grandmother   . Diabetes Maternal Grandmother   . Diabetes Maternal Grandfather   . Hypothyroidism Mother     Review of Systems: Noncontributory  PHYSICAL EXAM: Blood pressure 130/86, pulse 80, height 4\' 11"  (1.499 m), weight 135 lb 8 oz (61.5 kg), last menstrual period 12/25/2016. General appearance - alert, well appearing, and in no distress Chest - clear to auscultation, no wheezes, rales or rhonchi, symmetric air entry Heart -  normal rate and regular rhythm Abdomen - soft, nontender, nondistended, no masses or organomegaly Pelvic exam:  VULVA: normal appearing vulva with no masses, tenderness or lesions,  VAGINA: normal appearing vagina with normal color and discharge, no lesions,  CERVIX: normal appearing cervix without discharge or lesions,  UTERUS: uterus is normal size, shape, consistency and nontender, midplane, well supported ADNEXA: negative  Extremities - peripheral pulses normal, no pedal edema, no clubbing or cyanosis  Labs: No results found for this or any previous  visit (from the past 336 hour(s)).  Imaging Studies: No results found.  Assessment: Patient Active Problem List   Diagnosis Date Noted  . Generalized anxiety disorder 12/29/2016  . 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  . Fibroids 08/14/2015  . Menorrhagia with regular cycle 07/26/2015  . Sleep disturbance 07/26/2015  . 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 Anxiety 02/01/2015  . Breech presentation, single footling 12/27/2014  . Hypertension affecting pregnancy, antepartum 12/27/2014  . Gestational diabetes mellitus, antepartum 12/27/2014  . S/P cesarean section 12/27/2014  . 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 Short cervix affecting pregnancy 11/04/2014  . Hx of preterm delivery, currently pregnant 11/04/2014  . ASCUS pap  07/04/2014  . Supervision of high-risk pregnancy 06/29/2014  . Uterine fibroid during pregnancy, antepartum 06/29/2014  . Previous preterm delivery, antepartum 06/29/2014  . AMA (advanced maternal age) multigravida 35+ 05/31/2014  . Asthma   . Hypertension     Plan: Patient will undergo surgical management with endometrial ablation with bilateral salpingectomy Pain management Postop with tramadol and toradolWith Phenergan for nausea Surgery scheduled for 7:30 AM on 01/31/2017 .mec   01/27/2017 4:36 PM   By signing my name below, I, Izna Ahmed, attest that this documentation has been prepared under the direction and in the presence of Jonnie Kind, MD. Electronically Signed: Jabier Gauss, Medical Scribe. 01/27/17. 4:36 PM.  I personally performed the services described in this documentation, which was SCRIBED in my presence. The recorded information has been reviewed and considered accurate. It has been edited as necessary during review. Jonnie Kind, MD

## 2017-01-28 ENCOUNTER — Telehealth: Payer: Self-pay | Admitting: Obstetrics and Gynecology

## 2017-01-28 ENCOUNTER — Other Ambulatory Visit: Payer: Self-pay | Admitting: Obstetrics and Gynecology

## 2017-01-28 NOTE — Patient Instructions (Signed)
Your procedure is scheduled on:  Friday, Aug. 17, 2018  Enter through the [employer] [facility] at:  6:00 AM  Pick up the phone at the desk and dial [phone].  Call this number if you have problems the morning of surgery: [phone].  Remember: Do NOT eat food or drink after:  Midnight Thursday  Take these medicines the morning of surgery with a SIP OF WATER:  Xanax if needed  Bring Asthma Inhaler day of surgery  Stop ALL herbal medications at this time  Do NOT smoke the day of surgery.  Do NOT wear jewelry (body piercing), metal hair clips/bobby pins, make-up, artifical eyelashes or nail polish. Do NOT wear lotions, powders, or perfumes.  You may wear deodorant. Do NOT shave for 48 hours prior to surgery. Do NOT bring valuables to the hospital. Contacts, dentures, or bridgework may not be worn into surgery.  Have a responsible adult drive you home and stay with you for 24 hours after your procedure  Bring a copy of your healthcare power of attorney and living will documents.

## 2017-01-28 NOTE — Telephone Encounter (Signed)
Surgery scheduled for 7:30 AM on 01/31/2017 patient aware and orders in the computer

## 2017-01-29 ENCOUNTER — Encounter (HOSPITAL_COMMUNITY)
Admission: RE | Admit: 2017-01-29 | Discharge: 2017-01-29 | Disposition: A | Discharge status: Home / Self Care | Payer: BC Managed Care – PPO | Source: Ambulatory Visit | Attending: Obstetrics and Gynecology | Admitting: Obstetrics and Gynecology

## 2017-01-29 ENCOUNTER — Encounter (HOSPITAL_COMMUNITY): Payer: Self-pay

## 2017-01-29 DIAGNOSIS — Z01812 Encounter for preprocedural laboratory examination: Secondary | ICD-10-CM | POA: Insufficient documentation

## 2017-01-29 DIAGNOSIS — F411 Generalized anxiety disorder: Secondary | ICD-10-CM | POA: Diagnosis not present

## 2017-01-29 DIAGNOSIS — N838 Other noninflammatory disorders of ovary, fallopian tube and broad ligament: Secondary | ICD-10-CM | POA: Diagnosis not present

## 2017-01-29 DIAGNOSIS — Z302 Encounter for sterilization: Secondary | ICD-10-CM | POA: Diagnosis not present

## 2017-01-29 DIAGNOSIS — I10 Essential (primary) hypertension: Secondary | ICD-10-CM | POA: Diagnosis not present

## 2017-01-29 HISTORY — DX: Other specified postprocedural states: Z98.890

## 2017-01-29 HISTORY — DX: Nausea with vomiting, unspecified: R11.2

## 2017-01-29 HISTORY — DX: Personal history of urinary calculi: Z87.442

## 2017-01-29 LAB — URINALYSIS, ROUTINE W REFLEX MICROSCOPIC
Bilirubin Urine: NEGATIVE
Glucose, UA: NEGATIVE mg/dL
Hgb urine dipstick: NEGATIVE
Ketones, ur: NEGATIVE mg/dL
Leukocytes, UA: NEGATIVE
Nitrite: NEGATIVE
Protein, ur: NEGATIVE mg/dL
Specific Gravity, Urine: 1.011 (ref 1.005–1.030)
pH: 7 (ref 5.0–8.0)

## 2017-01-29 LAB — CBC
HCT: 41.1 % (ref 36.0–46.0)
Hemoglobin: 13.2 g/dL (ref 12.0–15.0)
MCH: 30.1 pg (ref 26.0–34.0)
MCHC: 32.1 g/dL (ref 30.0–36.0)
MCV: 93.6 fL (ref 78.0–100.0)
Platelets: 389 10*3/uL (ref 150–400)
RBC: 4.39 MIL/uL (ref 3.87–5.11)
RDW: 13.3 % (ref 11.5–15.5)
WBC: 7.7 10*3/uL (ref 4.0–10.5)

## 2017-01-31 ENCOUNTER — Encounter (HOSPITAL_COMMUNITY): Payer: Self-pay

## 2017-01-31 ENCOUNTER — Encounter (HOSPITAL_COMMUNITY)
Admission: RE | Disposition: A | Discharge status: Home / Self Care | Payer: Self-pay | Source: Ambulatory Visit | Attending: Obstetrics and Gynecology

## 2017-01-31 ENCOUNTER — Ambulatory Visit (HOSPITAL_COMMUNITY)
Admission: RE | Admit: 2017-01-31 | Discharge: 2017-01-31 | Disposition: A | Discharge status: Home / Self Care | Payer: BC Managed Care – PPO | Source: Ambulatory Visit | Attending: Obstetrics and Gynecology | Admitting: Obstetrics and Gynecology

## 2017-01-31 ENCOUNTER — Ambulatory Visit (HOSPITAL_COMMUNITY): Payer: BC Managed Care – PPO | Admitting: Anesthesiology

## 2017-01-31 DIAGNOSIS — F411 Generalized anxiety disorder: Secondary | ICD-10-CM | POA: Insufficient documentation

## 2017-01-31 DIAGNOSIS — N838 Other noninflammatory disorders of ovary, fallopian tube and broad ligament: Secondary | ICD-10-CM | POA: Diagnosis not present

## 2017-01-31 DIAGNOSIS — N920 Excessive and frequent menstruation with regular cycle: Secondary | ICD-10-CM | POA: Diagnosis not present

## 2017-01-31 DIAGNOSIS — Z302 Encounter for sterilization: Secondary | ICD-10-CM | POA: Insufficient documentation

## 2017-01-31 DIAGNOSIS — I10 Essential (primary) hypertension: Secondary | ICD-10-CM | POA: Insufficient documentation

## 2017-01-31 HISTORY — PX: HYSTEROSCOPY: SHX211

## 2017-01-31 HISTORY — PX: LAPAROSCOPIC BILATERAL SALPINGECTOMY: SHX5889

## 2017-01-31 LAB — TYPE AND SCREEN
ABO/RH(D): O POS
Antibody Screen: NEGATIVE

## 2017-01-31 LAB — GC/CHLAMYDIA PROBE AMP
Chlamydia trachomatis, NAA: NEGATIVE
Neisseria gonorrhoeae by PCR: NEGATIVE

## 2017-01-31 LAB — HCG, SERUM, QUALITATIVE: Preg, Serum: NEGATIVE

## 2017-01-31 SURGERY — ABLATION, ENDOMETRIUM, HYSTEROSCOPIC
Anesthesia: General | Laterality: Right

## 2017-01-31 MED ORDER — TRAMADOL HCL 50 MG PO TABS: 50.0000 mg | ORAL_TABLET | Freq: Four times a day (QID) | ORAL | 0 refills | Status: DC | PRN

## 2017-01-31 MED ORDER — LIDOCAINE-EPINEPHRINE (PF) 1 %-1:200000 IJ SOLN
INTRAMUSCULAR | Status: AC
  Filled 2017-01-31: qty 30

## 2017-01-31 MED ORDER — SODIUM CHLORIDE 0.9 % IJ SOLN
INTRAMUSCULAR | Status: AC
  Filled 2017-01-31: qty 100

## 2017-01-31 MED ORDER — SODIUM CHLORIDE 0.9 % IR SOLN
Status: DC | PRN
  Administered 2017-01-31: 3000 mL

## 2017-01-31 MED ORDER — PROMETHAZINE HCL 25 MG PO TABS: 25.0000 mg | ORAL_TABLET | Freq: Four times a day (QID) | ORAL | 0 refills | Status: DC | PRN

## 2017-01-31 MED ORDER — HYDROMORPHONE HCL 1 MG/ML IJ SOLN
0.2500 mg | INTRAMUSCULAR | Status: DC | PRN
  Administered 2017-01-31: 0.5 mg via INTRAVENOUS

## 2017-01-31 MED ORDER — SCOPOLAMINE 1 MG/3DAYS TD PT72
MEDICATED_PATCH | TRANSDERMAL | Status: AC
  Filled 2017-01-31: qty 1

## 2017-01-31 MED ORDER — ONDANSETRON HCL 4 MG/2ML IJ SOLN
INTRAMUSCULAR | Status: DC | PRN
  Administered 2017-01-31: 4 mg via INTRAVENOUS

## 2017-01-31 MED ORDER — LIDOCAINE HCL 1 % IJ SOLN
INTRAMUSCULAR | Status: AC
  Filled 2017-01-31: qty 20

## 2017-01-31 MED ORDER — PROPOFOL 10 MG/ML IV BOLUS
INTRAVENOUS | Status: AC
  Filled 2017-01-31: qty 40

## 2017-01-31 MED ORDER — VASOPRESSIN 20 UNIT/ML IV SOLN
INTRAVENOUS | Status: AC
  Filled 2017-01-31: qty 1

## 2017-01-31 MED ORDER — SCOPOLAMINE 1 MG/3DAYS TD PT72
1.0000 | MEDICATED_PATCH | Freq: Once | TRANSDERMAL | Status: AC
  Administered 2017-01-31: 1.5 mg via TRANSDERMAL
  Administered 2017-01-31: 1 via TRANSDERMAL

## 2017-01-31 MED ORDER — PROMETHAZINE HCL 25 MG/ML IJ SOLN: 6.2500 mg | INTRAMUSCULAR | Status: DC | PRN

## 2017-01-31 MED ORDER — ROCURONIUM BROMIDE 100 MG/10ML IV SOLN
INTRAVENOUS | Status: DC | PRN
  Administered 2017-01-31: 40 mg via INTRAVENOUS
  Administered 2017-01-31: 10 mg via INTRAVENOUS

## 2017-01-31 MED ORDER — SCOPOLAMINE 1 MG/3DAYS TD PT72
MEDICATED_PATCH | TRANSDERMAL | Status: AC
  Administered 2017-01-31: 1.5 mg via TRANSDERMAL
  Filled 2017-01-31: qty 1

## 2017-01-31 MED ORDER — GLYCOPYRROLATE 0.2 MG/ML IJ SOLN
INTRAMUSCULAR | Status: DC | PRN
  Administered 2017-01-31: 0.2 mg via INTRAVENOUS

## 2017-01-31 MED ORDER — MIDAZOLAM HCL 2 MG/2ML IJ SOLN: 0.5000 mg | Freq: Once | INTRAMUSCULAR | Status: DC | PRN

## 2017-01-31 MED ORDER — LIDOCAINE HCL 1 % IJ SOLN
INTRAMUSCULAR | Status: DC | PRN
  Administered 2017-01-31: 10 mL

## 2017-01-31 MED ORDER — SUGAMMADEX SODIUM 200 MG/2ML IV SOLN
INTRAVENOUS | Status: AC
Start: 2017-01-31 — End: 2017-01-31
  Filled 2017-01-31: qty 2

## 2017-01-31 MED ORDER — FENTANYL CITRATE (PF) 250 MCG/5ML IJ SOLN
INTRAMUSCULAR | Status: AC
  Filled 2017-01-31: qty 5

## 2017-01-31 MED ORDER — LIDOCAINE HCL (CARDIAC) 20 MG/ML IV SOLN
INTRAVENOUS | Status: AC
  Filled 2017-01-31: qty 5

## 2017-01-31 MED ORDER — MEPERIDINE HCL 25 MG/ML IJ SOLN: 6.2500 mg | INTRAMUSCULAR | Status: DC | PRN

## 2017-01-31 MED ORDER — FENTANYL CITRATE (PF) 100 MCG/2ML IJ SOLN
INTRAMUSCULAR | Status: AC
  Filled 2017-01-31: qty 2

## 2017-01-31 MED ORDER — HYDROMORPHONE HCL 1 MG/ML IJ SOLN
INTRAMUSCULAR | Status: AC
  Filled 2017-01-31: qty 0.5

## 2017-01-31 MED ORDER — DEXAMETHASONE SODIUM PHOSPHATE 10 MG/ML IJ SOLN
INTRAMUSCULAR | Status: DC | PRN
  Administered 2017-01-31: 10 mg via INTRAVENOUS

## 2017-01-31 MED ORDER — ONDANSETRON HCL 4 MG/2ML IJ SOLN
INTRAMUSCULAR | Status: AC
  Filled 2017-01-31: qty 2

## 2017-01-31 MED ORDER — CEFAZOLIN SODIUM-DEXTROSE 2-4 GM/100ML-% IV SOLN
2.0000 g | INTRAVENOUS | Status: AC
  Administered 2017-01-31: 2 g via INTRAVENOUS

## 2017-01-31 MED ORDER — DEXAMETHASONE SODIUM PHOSPHATE 10 MG/ML IJ SOLN
INTRAMUSCULAR | Status: AC
  Filled 2017-01-31: qty 1

## 2017-01-31 MED ORDER — SUGAMMADEX SODIUM 200 MG/2ML IV SOLN
INTRAVENOUS | Status: DC | PRN
  Administered 2017-01-31: 123.8 mg via INTRAVENOUS

## 2017-01-31 MED ORDER — FENTANYL CITRATE (PF) 100 MCG/2ML IJ SOLN
INTRAMUSCULAR | Status: DC | PRN
  Administered 2017-01-31: 50 ug via INTRAVENOUS
  Administered 2017-01-31 (×3): 100 ug via INTRAVENOUS

## 2017-01-31 MED ORDER — LIDOCAINE HCL (CARDIAC) 20 MG/ML IV SOLN
INTRAVENOUS | Status: DC | PRN
  Administered 2017-01-31: 80 mg via INTRAVENOUS

## 2017-01-31 MED ORDER — MIDAZOLAM HCL 2 MG/2ML IJ SOLN
INTRAMUSCULAR | Status: AC
  Filled 2017-01-31: qty 2

## 2017-01-31 MED ORDER — LACTATED RINGERS IV SOLN
INTRAVENOUS | Status: DC
  Administered 2017-01-31: 125 mL/h via INTRAVENOUS
  Administered 2017-01-31: 08:00:00 via INTRAVENOUS

## 2017-01-31 MED ORDER — PROPOFOL 10 MG/ML IV BOLUS
INTRAVENOUS | Status: DC | PRN
  Administered 2017-01-31: 200 mg via INTRAVENOUS

## 2017-01-31 MED ORDER — KETOROLAC TROMETHAMINE 10 MG PO TABS: 10.0000 mg | ORAL_TABLET | Freq: Four times a day (QID) | ORAL | 0 refills | Status: DC | PRN

## 2017-01-31 MED ORDER — MIDAZOLAM HCL 2 MG/2ML IJ SOLN
INTRAMUSCULAR | Status: DC | PRN
  Administered 2017-01-31: 2 mg via INTRAVENOUS

## 2017-01-31 SURGICAL SUPPLY — 48 items
ADH SKN CLS APL DERMABOND .7 (GAUZE/BANDAGES/DRESSINGS) ×3
APL SKNCLS STERI-STRIP NONHPOA (GAUZE/BANDAGES/DRESSINGS) ×2
BAG SPEC RTRVL LRG 6X4 10 (ENDOMECHANICALS)
BARRIER ADHS 3X4 INTERCEED (GAUZE/BANDAGES/DRESSINGS) IMPLANT
BENZOIN TINCTURE PRP APPL 2/3 (GAUZE/BANDAGES/DRESSINGS) ×5 IMPLANT
BRR ADH 4X3 ABS CNTRL BYND (GAUZE/BANDAGES/DRESSINGS)
CATH ROBINSON RED A/P 16FR (CATHETERS) ×5 IMPLANT
CLOSURE WOUND 1/2 X4 (GAUZE/BANDAGES/DRESSINGS) ×1
CLOSURE WOUND 1/4 X3 (GAUZE/BANDAGES/DRESSINGS) ×1
CLOTH BEACON ORANGE TIMEOUT ST (SAFETY) ×5 IMPLANT
CONTAINER PREFILL 10% NBF 60ML (FORM) ×10 IMPLANT
DERMABOND ADVANCED (GAUZE/BANDAGES/DRESSINGS) ×2
DERMABOND ADVANCED .7 DNX12 (GAUZE/BANDAGES/DRESSINGS) ×3 IMPLANT
DRSG OPSITE POSTOP 3X4 (GAUZE/BANDAGES/DRESSINGS) ×5 IMPLANT
FILTER SMOKE EVAC LAPAROSHD (FILTER) ×5 IMPLANT
GLOVE BIO SURGEON ST LM GN SZ9 (GLOVE) ×5 IMPLANT
GLOVE BIOGEL PI IND STRL 7.0 (GLOVE) ×3 IMPLANT
GLOVE BIOGEL PI IND STRL 9 (GLOVE) ×6 IMPLANT
GLOVE BIOGEL PI INDICATOR 7.0 (GLOVE) ×2
GLOVE BIOGEL PI INDICATOR 9 (GLOVE) ×4
GOWN STRL REUS W/TWL 2XL LVL3 (GOWN DISPOSABLE) ×5 IMPLANT
GOWN STRL REUS W/TWL LRG LVL3 (GOWN DISPOSABLE) ×10 IMPLANT
LIGASURE LAP ATLAS 10MM 37CM (INSTRUMENTS) IMPLANT
LIGASURE VESSEL 5MM BLUNT TIP (ELECTROSURGICAL) IMPLANT
NEEDLE INSUFFLATION 120MM (ENDOMECHANICALS) ×5 IMPLANT
NS IRRIG 1000ML POUR BTL (IV SOLUTION) ×5 IMPLANT
PACK LAPAROSCOPY BASIN (CUSTOM PROCEDURE TRAY) ×5 IMPLANT
PACK TRENDGUARD 450 HYBRID PRO (MISCELLANEOUS) ×3 IMPLANT
PACK TRENDGUARD 600 HYBRD PROC (MISCELLANEOUS) IMPLANT
PACK VAGINAL MINOR WOMEN LF (CUSTOM PROCEDURE TRAY) ×5 IMPLANT
PAD OB MATERNITY 4.3X12.25 (PERSONAL CARE ITEMS) ×5 IMPLANT
POUCH SPECIMEN RETRIEVAL 10MM (ENDOMECHANICALS) IMPLANT
PROTECTOR NERVE ULNAR (MISCELLANEOUS) ×10 IMPLANT
SCISSORS LAP 5X35 DISP (ENDOMECHANICALS) IMPLANT
SET GENESYS HTA PROCERVA (MISCELLANEOUS) ×5 IMPLANT
SET IRRIG TUBING LAPAROSCOPIC (IRRIGATION / IRRIGATOR) IMPLANT
SHEARS HARMONIC ACE PLUS 36CM (ENDOMECHANICALS) IMPLANT
SLEEVE XCEL OPT CAN 5 100 (ENDOMECHANICALS) ×5 IMPLANT
STRIP CLOSURE SKIN 1/2X4 (GAUZE/BANDAGES/DRESSINGS) ×4 IMPLANT
STRIP CLOSURE SKIN 1/4X3 (GAUZE/BANDAGES/DRESSINGS) ×4 IMPLANT
SUT VIC AB 4-0 PS2 27 (SUTURE) ×5 IMPLANT
SUT VICRYL 0 UR6 27IN ABS (SUTURE) ×10 IMPLANT
TOWEL OR 17X24 6PK STRL BLUE (TOWEL DISPOSABLE) ×10 IMPLANT
TRAY FOLEY CATH SILVER 14FR (SET/KITS/TRAYS/PACK) IMPLANT
TRENDGUARD 450 HYBRID PRO PACK (MISCELLANEOUS) ×5
TRENDGUARD 600 HYBRID PROC PK (MISCELLANEOUS)
TROCAR XCEL NON-BLD 11X100MML (ENDOMECHANICALS) ×5 IMPLANT
TROCAR XCEL NON-BLD 5MMX100MML (ENDOMECHANICALS) ×5 IMPLANT

## 2017-01-31 NOTE — Anesthesia Procedure Notes (Signed)
Procedure Name: Intubation Date/Time: 01/31/2017 7:32 AM Performed by: Rayvon Char Pre-anesthesia Checklist: Patient identified, Emergency Drugs available, Suction available and Patient being monitored Patient Re-evaluated:Patient Re-evaluated prior to induction Oxygen Delivery Method: Circle system utilized Preoxygenation: Pre-oxygenation with 100% oxygen Induction Type: IV induction Ventilation: Mask ventilation without difficulty Laryngoscope Size: Miller and 2 Grade View: Grade I Tube type: Oral Laser Tube: Cuffed inflated with minimal occlusive pressure - saline Tube size: 7.0 mm Number of attempts: 1 Airway Equipment and Method: Stylet Placement Confirmation: ETT inserted through vocal cords under direct vision,  positive ETCO2 and breath sounds checked- equal and bilateral Secured at: 22 cm Tube secured with: Tape Dental Injury: Teeth and Oropharynx as per pre-operative assessment

## 2017-01-31 NOTE — Discharge Instructions (Signed)
DISCHARGE INSTRUCTIONS: HYSTEROSCOPY / ENDOMETRIAL ABLATION The following instructions have been prepared to help you care for yourself upon your return home.  May Remove Scop patch on or before  May take Ibuprofen after  May take stool softner while taking narcotic pain medication to prevent constipation.  Drink plenty of water.  Personal hygiene:  Use sanitary pads for vaginal drainage, not tampons.  Shower the day after your procedure.  NO tub baths, pools or Jacuzzis for 2-3 weeks.  Wipe front to back after using the bathroom.  Activity and limitations:  Do NOT drive or operate any equipment for 24 hours. The effects of anesthesia are still present and drowsiness may result.  Do NOT rest in bed all day.  Walking is encouraged.  Walk up and down stairs slowly.  You may resume your normal activity in one to two days or as indicated by your physician. Sexual activity: NO intercourse for at least 2 weeks after the procedure, or as indicated by your Doctor.  Diet: Eat a light meal as desired this evening. You may resume your usual diet tomorrow.  Return to Work: You may resume your work activities in one to two days or as indicated by Marine scientist.  What to expect after your surgery: Expect to have vaginal bleeding/discharge for 2-3 days and spotting for up to 10 days. It is not unusual to have soreness for up to 1-2 weeks. You may have a slight burning sensation when you urinate for the first day. Mild cramps may continue for a couple of days. You may have a regular period in 2-6 weeks.  Call your doctor for any of the following:  Excessive vaginal bleeding or clotting, saturating and changing one pad every hour.  Inability to urinate 6 hours after discharge from hospital.  Pain not relieved by pain medication.  Fever of 100.4 F or greater.  Unusual vaginal discharge or odor.  Return to office _________________Call for an appointment  ___________________ Patients signature: ______________________ Nurses signature ________________________  Post Anesthesia Care Unit [phone] INSTRUCTIONS: HYSTEROSCOPY / ENDOMETRIAL ABLATION The following instructions have been prepared to help you care for yourself upon your return home.  May Remove Scop patch on or before  May take Ibuprofen after  May take stool softner while taking narcotic pain medication to prevent constipation.  Drink plenty of water.  Personal hygiene:  Use sanitary pads for vaginal drainage, not tampons.  Shower the day after your procedure.  NO tub baths, pools or Jacuzzis for 2-3 weeks.  Wipe front to back after using the bathroom.  Activity and limitations:  Do NOT drive or operate any equipment for 24 hours. The effects of anesthesia are still present and drowsiness may result.  Do NOT rest in bed all day.  Walking is encouraged.  Walk up and down stairs slowly.  You may resume your normal activity in one to two days or as indicated by your physician. Sexual activity: NO intercourse for at least 2 weeks after the procedure, or as indicated by your Doctor.  Diet: Eat a light meal as desired this evening. You may resume your usual diet tomorrow.  Return to Work: You may resume your work activities in one to two days or as indicated by Marine scientist.  What to expect after your surgery: Expect to have vaginal bleeding/discharge for 2-3 days and spotting for up to 10 days. It is not unusual to have soreness for up to 1-2 weeks. You may have a slight burning sensation  when you urinate for the first day. Mild cramps may continue for a couple of days. You may have a regular period in 2-6 weeks.  Call your doctor for any of the following:  Excessive vaginal bleeding or clotting, saturating and changing one pad every hour.  Inability to urinate 6 hours after discharge from hospital.  Pain not relieved by pain medication.  Fever  of 100.4 F or greater.  Unusual vaginal discharge or odor.  Return to office _________________Call for an appointment ___________________ Patients signature: ______________________ Nurses signature ________________________  Post Anesthesia Care Unit [phone] INSTRUCTIONS: HYSTEROSCOPY / ENDOMETRIAL ABLATION The following instructions have been prepared to help you care for yourself upon your return home.  May Remove Scop patch on or before  May take Ibuprofen after  May take stool softner while taking narcotic pain medication to prevent constipation.  Drink plenty of water.  Personal hygiene:  Use sanitary pads for vaginal drainage, not tampons.  Shower the day after your procedure.  NO tub baths, pools or Jacuzzis for 2-3 weeks.  Wipe front to back after using the bathroom.  Activity and limitations:  Do NOT drive or operate any equipment for 24 hours. The effects of anesthesia are still present and drowsiness may result.  Do NOT rest in bed all day.  Walking is encouraged.  Walk up and down stairs slowly.  You may resume your normal activity in one to two days or as indicated by your physician. Sexual activity: NO intercourse for at least 2 weeks after the procedure, or as indicated by your Doctor.  Diet: Eat a light meal as desired this evening. You may resume your usual diet tomorrow.  Return to Work: You may resume your work activities in one to two days or as indicated by Marine scientist.  What to expect after your surgery: Expect to have vaginal bleeding/discharge for 2-3 days and spotting for up to 10 days. It is not unusual to have soreness for up to 1-2 weeks. You may have a slight burning sensation when you urinate for the first day. Mild cramps may continue for a couple of days. You may have a regular period in 2-6 weeks.  Call your doctor for any of the following:  Excessive vaginal bleeding or clotting, saturating and changing one pad  every hour.  Inability to urinate 6 hours after discharge from hospital.  Pain not relieved by pain medication.  Fever of 100.4 F or greater.  Unusual vaginal discharge or odor.  Return to office _________________Call for an appointment ___________________ Patients signature: ______________________ Nurses signature ________________________

## 2017-01-31 NOTE — Anesthesia Preprocedure Evaluation (Addendum)
Anesthesia Evaluation  Patient identified by MRN, date of birth, ID band Patient awake    Reviewed: Allergy & Precautions, NPO status , Patient's Chart, lab work & pertinent test results  History of Anesthesia Complications (+) PONV  Airway Mallampati: II  TM Distance: >3 FB Neck ROM: Full    Dental  (+) Dental Advisory Given, Teeth Intact   Pulmonary asthma ,    breath sounds clear to auscultation       Cardiovascular (-) hypertension(-) anginanegative cardio ROS   Rhythm:Regular Rate:Normal     Neuro/Psych Anxiety Depression Klippel-Feil    GI/Hepatic negative GI ROS, Neg liver ROS,   Endo/Other  negative endocrine ROS  Renal/GU negative Renal ROS     Musculoskeletal negative musculoskeletal ROS (+)   Abdominal   Peds  Hematology negative hematology ROS (+)   Anesthesia Other Findings   Reproductive/Obstetrics                            Anesthesia Physical Anesthesia Plan  ASA: II  Anesthesia Plan: General   Post-op Pain Management:    Induction: Intravenous  PONV Risk Score and Plan: 4 or greater and Ondansetron, Dexamethasone, Midazolam and Scopolamine patch - Pre-op  Airway Management Planned: Oral ETT  Additional Equipment:   Intra-op Plan:   Post-operative Plan: Extubation in OR  Informed Consent: I have reviewed the patients History and Physical, chart, labs and discussed the procedure including the risks, benefits and alternatives for the proposed anesthesia with the patient or authorized representative who has indicated his/her understanding and acceptance.   Dental advisory given  Plan Discussed with: CRNA and Surgeon  Anesthesia Plan Comments: (Plan routine monitors, GETA)        Anesthesia Quick Evaluation

## 2017-01-31 NOTE — H&P (Signed)
Preoperative History and Physical  [name] is a 36 y.o. [id number] here for surgical management of menorrhagia with regular cycle.   No significant preoperative concerns. Lengthy discussions have been conducted already explaining the HTA endometrial ablation process with this risks benefits total concerns including the risk of thermal injury. The patient understands this and is comfortable with the planned surgery. We will do the endometrial ablation first and then 2 laparoscopy for the salpingectomy to follow.  Proposed surgery: Hysteroscopy, D&C, endometrial ablation with bilateral salpingectomy.      Past Medical History:  Diagnosis Date  . AMA (advanced maternal age) multigravida 35+ 05/31/2014  . Anxiety 02/01/2015  . Asthma   . Fibroids 08/14/2015  . Hypertension   . Klippel-Feil deformity    syrinx  . Menorrhagia with regular cycle 07/26/2015  . Pregnant 05/31/2014  . Sleep disturbance 07/26/2015  . Spotting 05/31/2014        Past Surgical History:  Procedure Laterality Date  . CESAREAN SECTION N/A 12/27/2014   Procedure: CESAREAN SECTION;  Surgeon: Osborne Oman, MD;  Location: [facility];  Service: Obstetrics;  Laterality: N/A;  . CHOLECYSTECTOMY    . DILATION AND CURETTAGE OF UTERUS                     OB History  Gravida Para Term Preterm AB Living  3 2 1 1 1 2   SAB TAB Ectopic Multiple Live Births  1     0 2    # Outcome Date GA Lbr Len/2nd Weight Sex Delivery Anes PTL Lv  3 Term 12/27/14 [redacted]w[redacted]d  5 lb 15.6 oz (2.71 kg) F CS-LTranv Spinal  LIV  2 Preterm 11/25/09 [redacted]w[redacted]d  5 lb 9 oz (2.523 kg) F Vag-Spont EPI Y LIV  1 SAB  [redacted]w[redacted]d           Patient denies any other pertinent gynecologic issues.         Current Outpatient Prescriptions on File Prior to Visit  Medication Sig Dispense Refill  . albuterol (PROVENTIL HFA;VENTOLIN HFA) 108 (90 Base) MCG/ACT inhaler Inhale 1-2 puffs into the lungs every 6 (six) hours as needed for wheezing or  shortness of breath.    . ALPRAZolam (XANAX) 0.5 MG tablet Take 1 tablet (0.5 mg total) by mouth at bedtime as needed for anxiety. (Patient taking differently: Take 0.25-0.5 mg by mouth at bedtime as needed for anxiety. ) 30 tablet 2   No current facility-administered medications on file prior to visit.         Allergies  Allergen Reactions  . Biaxin [Clarithromycin] Hives  . Cefzil [Cefprozil] Nausea Only  . Ciprofloxacin Hives  . Hydrocodone Other (See Comments)    Causes patient to feel wired  . Percocet [Oxycodone-Acetaminophen] Other (See Comments)    Causes patient to feel wired  . Sulfa Antibiotics Hives  . Percodan [Oxycodone-Aspirin] Anxiety    Social History:   reports that she has never smoked. She has never used smokeless tobacco. She reports that she drinks alcohol. She reports that she does not use drugs.       Family History  Problem Relation Age of Onset  . Diabetes Paternal Grandfather   . Cancer Paternal Grandfather        lung  . Diabetes Paternal Grandmother   . Diabetes Maternal Grandmother   . Diabetes Maternal Grandfather   . Hypothyroidism Mother     Review of Systems: Noncontributory  PHYSICAL EXAM: Blood pressure 130/86, pulse  80, height 4\' 11"  (1.499 m), weight 135 lb 8 oz (61.5 kg), last menstrual period 12/25/2016. General appearance - alert, well appearing, and in no distress Chest - clear to auscultation, no wheezes, rales or rhonchi, symmetric air entry Heart - normal rate and regular rhythm Abdomen - soft, nontender, nondistended, no masses or organomegaly Pelvic exam:  VULVA: normal appearing vulva with no masses, tenderness or lesions,  VAGINA: normal appearing vagina with normal color and discharge, no lesions,  CERVIX: normal appearing cervix without discharge or lesions,  UTERUS: uterus is normal size, shape, consistency and nontender, midplane, well supported ADNEXA: negative  Extremities - peripheral pulses  normal, no pedal edema, no clubbing or cyanosis  Labs: No results found for this or any previous visit (from the past 336 hour(s)).  Imaging Studies: ImagingResults  No results found.    Assessment: Patient Active Problem List   Diagnosis Date Noted  . Generalized anxiety disorder 12/29/2016  . 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  . Fibroids 08/14/2015  . Menorrhagia with regular cycle 07/26/2015  . Sleep disturbance 07/26/2015  . 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 Anxiety 02/01/2015  . Breech presentation, single footling 12/27/2014  . Hypertension affecting pregnancy, antepartum 12/27/2014  . Gestational diabetes mellitus, antepartum 12/27/2014  . S/P cesarean section 12/27/2014  . 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 Short cervix affecting pregnancy 11/04/2014  . Hx of preterm delivery, currently pregnant 11/04/2014  . ASCUS pap  07/04/2014  . Supervision of high-risk pregnancy 06/29/2014  . Uterine fibroid during pregnancy, antepartum 06/29/2014  . Previous preterm delivery, antepartum 06/29/2014  . AMA (advanced maternal age) multigravida 35+ 05/31/2014  . Asthma   . Hypertension     Plan: Patient will undergo surgical management with endometrial ablation with bilateral salpingectomy Pain management Postop with tramadol and toradolWith Phenergan for nausea Surgery scheduled for 7:30 AM on 01/31/2017 .mec   01/27/2017 4:36 PM   By signing my name below, I, Izna Ahmed, attest that this documentation has been prepared under the direction and in the presence of Jonnie Kind, MD. Electronically Signed: Jabier Gauss, Medical Scribe. 01/27/17. 4:36 PM.  I personally performed the services described in this documentation, which was SCRIBED in my presence. The recorded information has been reviewed and considered accurate. It has been edited as necessary during review. Jonnie Kind, MD

## 2017-01-31 NOTE — Brief Op Note (Signed)
01/31/2017  9:42 AM  PATIENT:  [name]  36 y.o. female  PRE-OPERATIVE DIAGNOSIS:  Menorrhgia, Desires For Sterilization  POST-OPERATIVE DIAGNOSIS:  Menorrhgia, Desires For Sterilization, RIGHT PARATUBAL CYST  PROCEDURE:  Procedure(s): HYSTEROSCOPY WITH HYDROTHERMAL ABLATION (N/A) LAPAROSCOPIC BILATERAL SALPINGECTOMY WITH REMOVAL OF RIGHT OPARATUBAL CYST (Bilateral)  SURGEON:  Surgeon(s) and Role:    Jonnie Kind, MD - Primary  PHYSICIAN ASSISTANT:   ASSISTANTS: none   ANESTHESIA:   general  EBL:  Total I/O In: 1300 [I.V.:1300] Out: 97 [Urine:72; Blood:25]  BLOOD ADMINISTERED:none  DRAINS: none   LOCAL MEDICATIONS USED:  MARCAINE    and Amount: 20 ml  SPECIMEN:  Source of Specimen:  Fallopian tube left, right fimbria and right paratubal cyst  DISPOSITION OF SPECIMEN:  PATHOLOGY  COUNTS:  YES  TOURNIQUET:  * No tourniquets in log *  DICTATION: .Dragon Dictation  PLAN OF CARE: Discharge to home after PACU  PATIENT DISPOSITION:  PACU - hemodynamically stable.   Delay start of Pharmacological VTE agent (>24hrs) due to surgical blood loss or risk of bleeding: not applicable Details of procedure:. Patient was taken to the operating room prepped and draped for combined abdominal and vaginal procedure timeout conducted and procedure confirmed by surgical team. Ancef was administered 2 g. 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 The HTA device was tested and sealed confirmed with absolutely no fluid loss identified. The and minute HTA thermal ablation sequence was then completed. Blood loss  occurred. Procedure was considered satisfactorily completed. Photos documented after the procedure was completed. Ligation. At this time Hulka tenaculum was attached to the cervix, and bladder area and out catheterized. We then went to the abdomen where infraumbilical vertical 1 cm schedule incision was made as well as a transverse suprapubic 1 cm incision in the right lower quadrant incision of similar length. The Veress needle was used to achieve pneumoperitoneum under 6 mm intra-abdominal pressure after water droplet technique used to confirm intraperitoneal free flow of. Insufflation with 2.5 L CO2 was followed by placement of 5 mm trocar with direct visualization performed during placement and no evidence of problems. Suprapubic 11 mm trocar was placed and a third 5 mm trocar placed in the right lower quadrant There was an omental adhesions just below the umbilicus to the anterior abdominal wall. There was no bowel involved in the adhesion. Attention was directed to the pelvis and photos documented the uterine anatomy. There was a normal left fallopian tube and ovary attached to an large body of the uterus. On the right side there was a fallopian tube remnant and an ovary. The ureter could be visualized in the retroperitoneum well away from the location of the fallopian tube proximal portion of the tube was very atrophic, but the fimbria could be identified and the distal portion of the tube removed using harmonic scalpel, and the specimen included a small 1 cm paratubal cyst. The specimen was extracted through the suprapubic port. On the left side the fallopian tube was taken off it in its entirety harmonic Ace 7 to coagulate the pedicle.Marland Kitchen Specimen was extracted, saline was instilled in the abdomen at  60 cc to assist with evacuation of carbon dioxide, laparoscopic trochars extracted, the fascia closed at the suprapubic 11 mm trocar site with 0 Vicryl, then each suture site closed with subcuticular 4-0  Vicryl. Steri-Strips and Band-Aids were applied.` Patient allowed to awaken and go recovery room in stable condition, without complications

## 2017-01-31 NOTE — Anesthesia Postprocedure Evaluation (Signed)
Anesthesia Post Note  Patient: Analilia Geddis Dempsey  Procedure(s) Performed: Procedure(s) (LRB): HYSTEROSCOPY WITH HYDROTHERMAL ABLATION (N/A) LAPAROSCOPIC BILATERAL SALPINGECTOMY WITH REMOVAL OF RIGHT OPARATUBAL CYST (Bilateral)     Patient location during evaluation: PACU Anesthesia Type: General Level of consciousness: awake and alert, patient cooperative and oriented Pain management: pain level controlled Vital Signs Assessment: post-procedure vital signs reviewed and stable Respiratory status: spontaneous breathing, nonlabored ventilation and respiratory function stable Cardiovascular status: blood pressure returned to baseline and stable Postop Assessment: no signs of nausea or vomiting Anesthetic complications: no    Last Vitals:  Vitals:   01/31/17 0957 01/31/17 1015  BP:    Pulse: 87 81  Resp: 17 16  Temp:  36.6 C  SpO2: 98% 94%    Last Pain:  Vitals:   01/31/17 1015  TempSrc:   PainSc: 0-No pain   Pain Goal: Patients Stated Pain Goal: 5 (01/31/17 0945)               Seleta Rhymes. Eliza Green

## 2017-01-31 NOTE — Transfer of Care (Signed)
Immediate Anesthesia Transfer of Care Note  Patient: [name]  Procedure(s) Performed: Procedure(s): HYSTEROSCOPY WITH HYDROTHERMAL ABLATION (N/A) LAPAROSCOPIC BILATERAL SALPINGECTOMY WITH REMOVAL OF RIGHT OPARATUBAL CYST (Bilateral)  Patient Location: PACU  Anesthesia Type:General  Level of Consciousness: awake, alert  and oriented  Airway & Oxygen Therapy: Patient Spontanous Breathing and Patient connected to nasal cannula oxygen  Post-op Assessment: Report given to RN and Post -op Vital signs reviewed and stable  Post vital signs: Reviewed and stable  Last Vitals:  Vitals:   01/31/17 0623  BP: (!) 124/92  Pulse: 75  Resp: 16  Temp: 36.7 C  SpO2: 100%    Last Pain:  Vitals:   01/31/17 0623  TempSrc: Oral      Patients Stated Pain Goal: 5 (36/14/43 1540)  Complications: No apparent anesthesia complications

## 2017-02-01 ENCOUNTER — Encounter (HOSPITAL_COMMUNITY): Payer: Self-pay | Admitting: Obstetrics and Gynecology

## 2017-02-03 ENCOUNTER — Telehealth: Payer: Self-pay | Admitting: Obstetrics and Gynecology

## 2017-02-03 NOTE — Op Note (Signed)
Please see the brief operative note for surgical details 

## 2017-02-03 NOTE — Telephone Encounter (Signed)
Patient doing well and returning to work today half day 02/04/2007. Will follow-up in 2 weeks for postoperative check

## 2017-02-04 ENCOUNTER — Telehealth: Payer: Self-pay | Admitting: Obstetrics and Gynecology

## 2017-02-04 NOTE — Telephone Encounter (Signed)
LMOVM that dressing may be removed if saturated if not she could leave in place for a couple more days. Advised to keep area clean and dry and to monitor for infection. Advised to call back if further questions.

## 2017-02-07 ENCOUNTER — Telehealth: Payer: Self-pay | Admitting: Obstetrics and Gynecology

## 2017-02-07 NOTE — Telephone Encounter (Signed)
Patient called stating that she had surgery with Dr. Glo Herring not to long ago. Pt states that she has been having pressure and pt states she wasn't bleeding before and now she is. Please contact pt

## 2017-02-08 NOTE — Telephone Encounter (Signed)
Left message for pt to expect some bleeding as the endometrium sloughs. Heavy bleeding or clots are NOT expectd, and pt should let me know if that happens. Pt has my numbers.

## 2017-02-18 ENCOUNTER — Other Ambulatory Visit: Payer: Self-pay | Admitting: Obstetrics and Gynecology

## 2017-02-18 ENCOUNTER — Telehealth: Payer: Self-pay | Admitting: Obstetrics and Gynecology

## 2017-02-18 DIAGNOSIS — G8918 Other acute postprocedural pain: Secondary | ICD-10-CM

## 2017-02-18 DIAGNOSIS — R109 Unspecified abdominal pain: Principal | ICD-10-CM

## 2017-02-18 MED ORDER — TRAMADOL HCL 50 MG PO TABS: 50.0000 mg | ORAL_TABLET | Freq: Four times a day (QID) | ORAL | 0 refills | Status: DC | PRN

## 2017-02-18 NOTE — Telephone Encounter (Signed)
LMOVM returning call 

## 2017-02-18 NOTE — Telephone Encounter (Signed)
Patient states she has been hurting more recently but is probably due to starting school and moving around more. She is cramping more at night, has tried Ibuprofen but with no relief and feels like a stitch is pulling. Informed patient that increase in movement could be her cause of more cramping and stitches are dissolvable. Spoke to Dr Glo Herring who prescribed Tramadol. Will fax to pharmacy.

## 2017-02-18 NOTE — Telephone Encounter (Signed)
Patient called stating that she started class and she is feeling sharp pain on her right side. Pt states that she had surgery done not to long ago and she states that she might still have a stitch in there. Pt is in pain and would like to know if there is something she could take or could he prescribed her something for it. Please contact pt

## 2017-02-24 ENCOUNTER — Encounter: Payer: BC Managed Care – PPO | Admitting: Obstetrics and Gynecology

## 2017-03-08 IMAGING — US US OB LIMITED
1 series · 13 of 28 positions shown · non-contrast
Comparison: none

[Series 1: preterm labor, cl · 0.23mm/px · 13 of 29 slices shown]
[im 2/29]
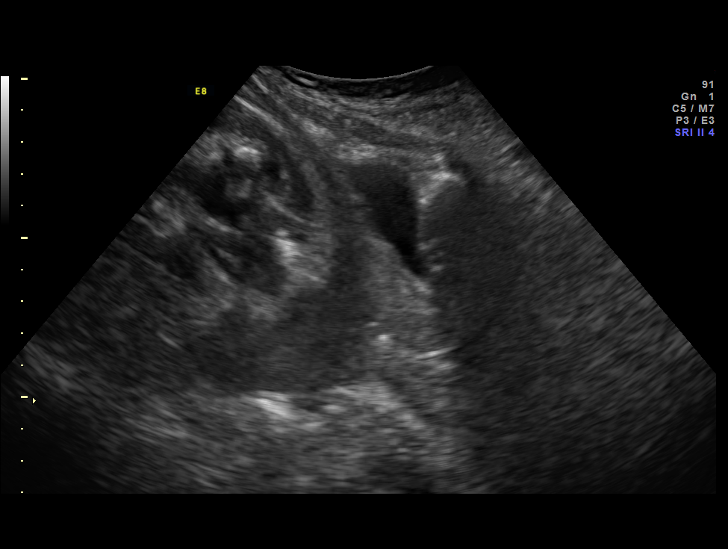
[im 4/29]
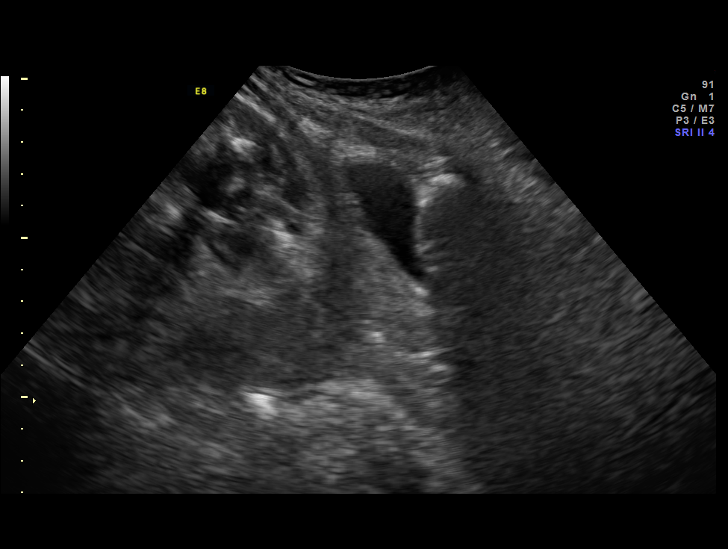
[im 6/29]
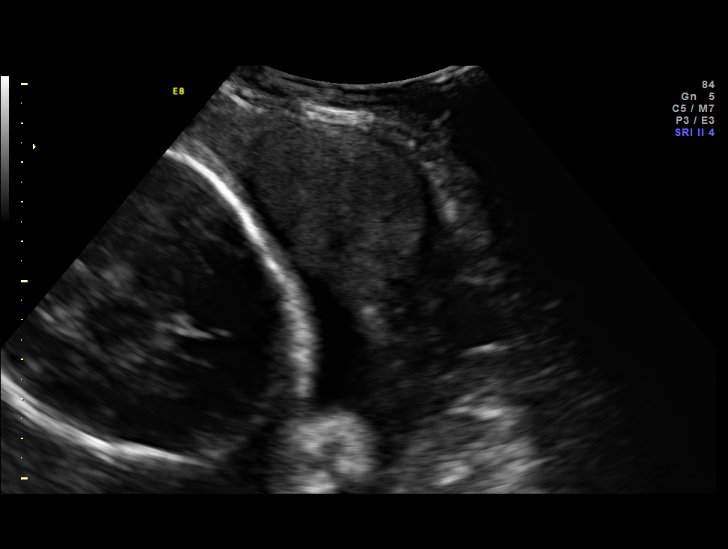
[im 8/29]
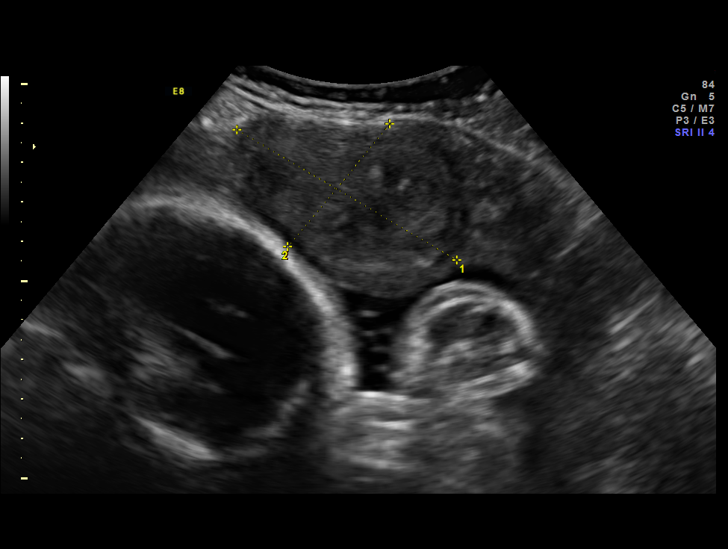
[im 10/29]
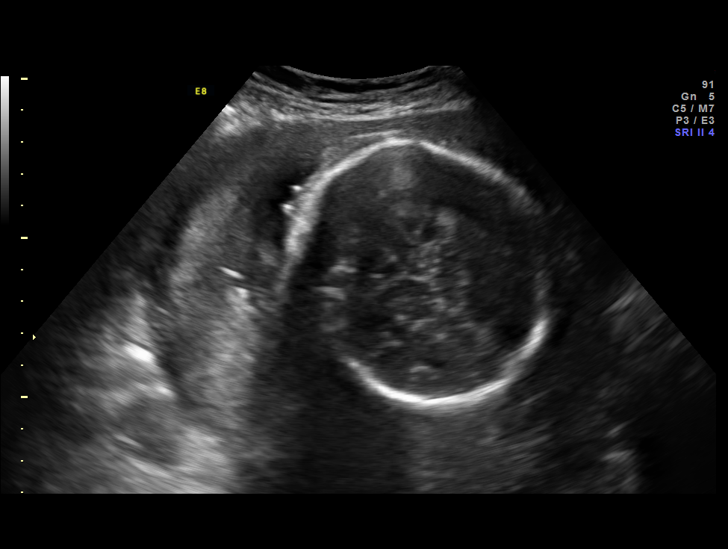
[im 12/29]
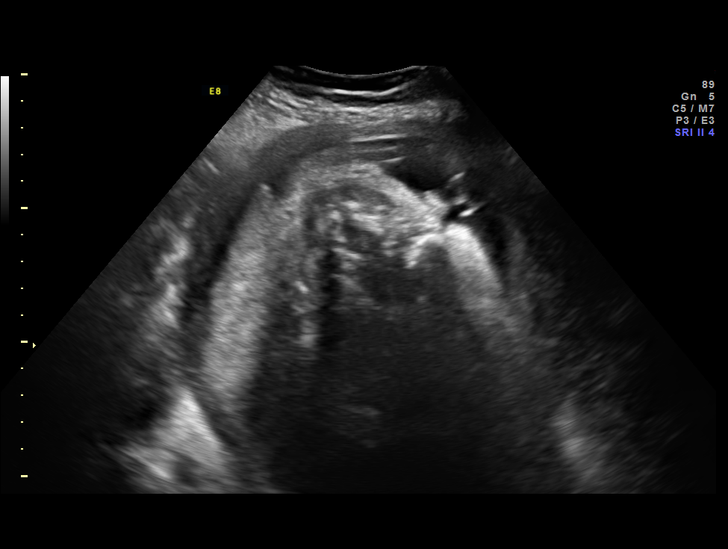
[im 15/29]
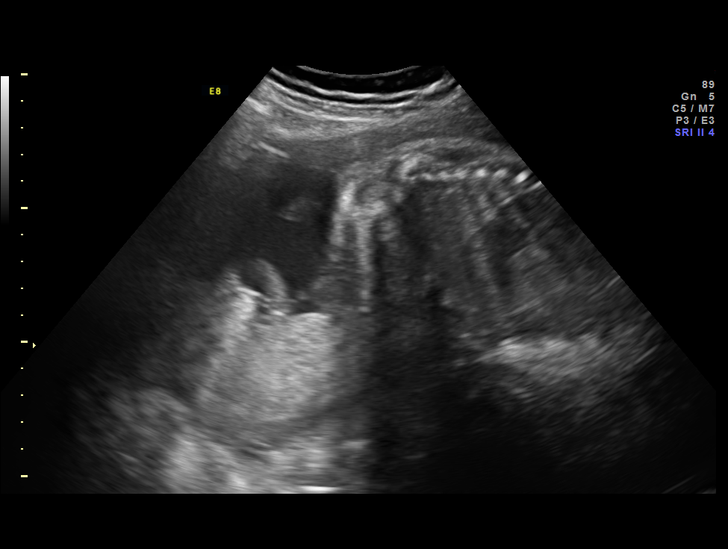
[im 17/29]
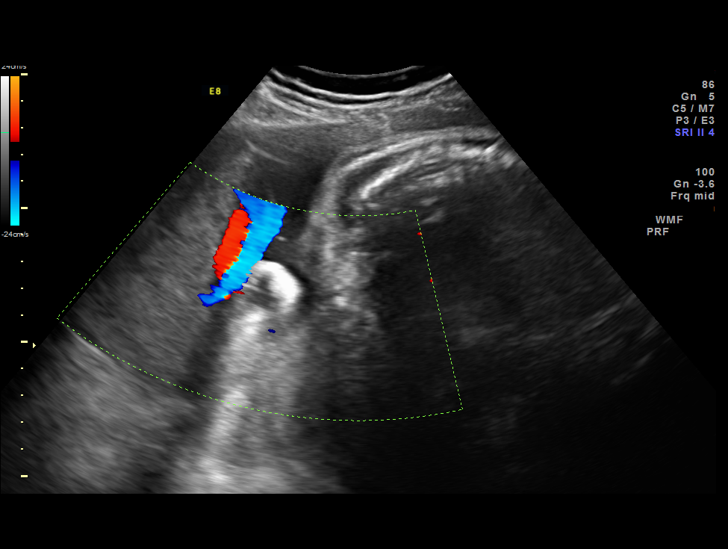
[im 19/29]
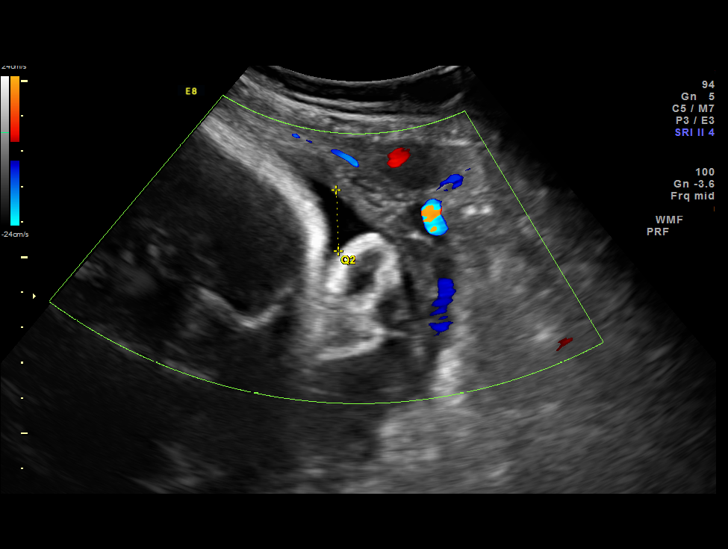
[im 21/29]
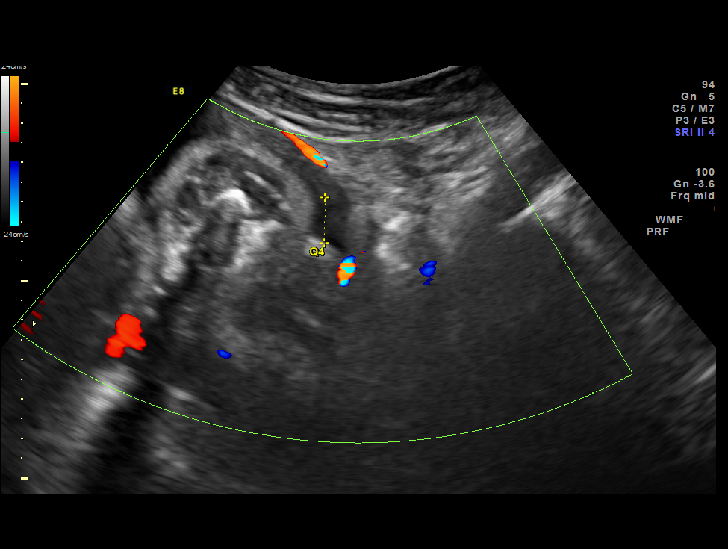
[im 23/29]
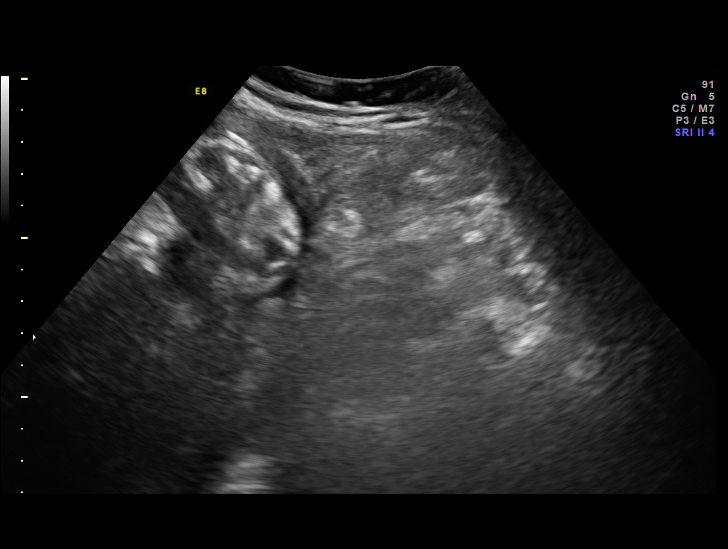
[im 25/29]
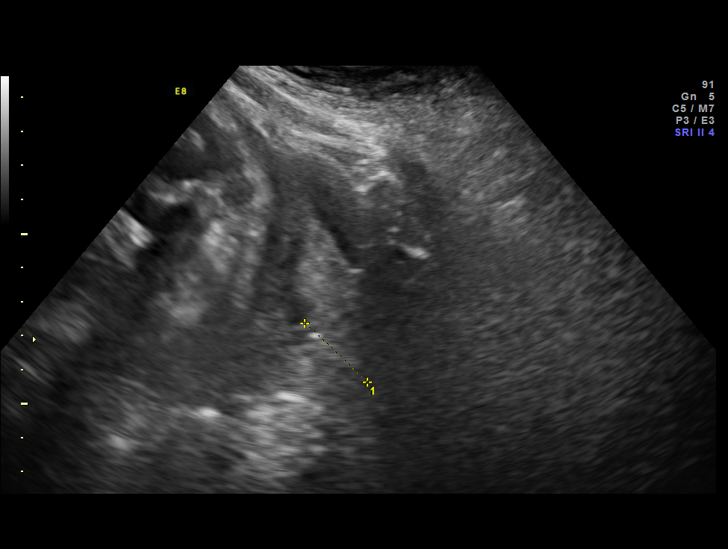
[im 27/29]
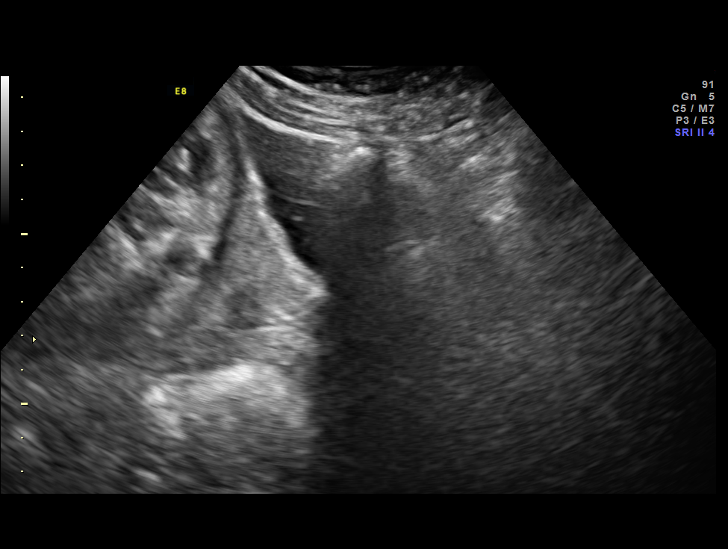

[13 of 28 positions shown; findings below may reference images not displayed]

OBSTETRICS REPORT
(Signed Final 11/30/2014 [DATE])

Name:       KEDAR BLOMQUIST                   Visit  11/29/2014 [DATE]
Date:

Service(s) Provided

[HOSPITAL]                                          76815.0
Indications

33 weeks gestation of pregnancy
Poor obstetric history: Previous preterm delivery
Preterm labor
Fetal Evaluation

Num Of             1
Fetuses:
Fetal Heart        135                          bpm
Rate:
Cardiac Activity:  Observed
Presentation:      Breech
Placenta:          Posterior, above cervical
os
P. Cord            Visualized, central
Insertion:

Amniotic Fluid
AFI FV:      Subjectively within normal limits
AFI Sum:     11.64    cm      30  %Tile     Larg Pckt:    4.58   cm
RUQ:   4.17    cm    RLQ:   1.15    cm   LUQ:    1.74    cm   LLQ:    4.58   cm
Gestational Age

Best:          33w 0d    Det. By:   Early Ultrasound          EDD:   01/17/15
(06/15/14)
Cervix Uterus Adnexa

Cervical Length:    2.5       cm

Cervix:       Normal appearance by transabdominal scan.
Uterus:       No abnormality visualized.
Cul De Sac:   No free fluid seen.

Left Ovary:    Not visualized.
Right Ovary:   Not visualized.
Adnexa:     No abnormality visualized.
Myomas

Site                     L(cm)       W(cm)      D(cm)      Location
Anterior

Blood Flow                  RI       PI        Comments

Impression

Single IUP at 33w 0d
Limited ultrasound performed due to preterm labor
Cervical length 2.5 cm (transabdominal)
A large anterior myoma is noted as described above
Breech presentation
Posterior placenta
Normal amniotic fluid volume
Recommendations

Follow-up ultrasounds as clinically indicated.

## 2017-03-18 ENCOUNTER — Other Ambulatory Visit: Payer: Self-pay | Admitting: *Deleted

## 2017-03-18 ENCOUNTER — Telehealth: Payer: Self-pay | Admitting: Family Medicine

## 2017-03-18 MED ORDER — TRAZODONE HCL 50 MG PO TABS: ORAL_TABLET | ORAL | 0 refills | Status: DC

## 2017-03-18 NOTE — Telephone Encounter (Signed)
Nurse's-currently medication list as alprazolam nightly as needed. This is typically a medication that can help temporarily for sleep is the patient using this? Certainly we can try different medicine for short-term use if  not using this.

## 2017-03-18 NOTE — Telephone Encounter (Signed)
Pt quit taking alprazolam. She didn't like the way it made her feel

## 2017-03-18 NOTE — Telephone Encounter (Signed)
So in regards to sleep there are a couple different choices. One choice would be trazodone 50 mg tablet-this is a antidepressant that can also help with sleep. Typically what is recommended is one half to one tablet each evening as needed to help with sleep. It is not habit forming. Tends to be well tolerated. A second option would be Ambien 5 mg daily at bedtime when necessary but the drawback with this medicine is it would be difficult for person to wake up in the middle the night if they had to such as for parenting reasons. For this reason I would lean more toward trazodone to give it a try. If she decides she would like to try trazodone 30 tablets 1/2-1 daily at bedtime when necess  for sleep-caution drowsiness please discuss with the patient see what she would like to do-unfortunately there is no such thing as the perfect sleep medicine often it is necessary to try something to find out if it is effective for the individual

## 2017-03-18 NOTE — Telephone Encounter (Signed)
Patient said she has been having trouble sleeping.  She said that she is really happy and full of energy.  She said that she just got her tubes taken out and thinks its possibly causing a change to her hormones.  She would like to know if Dr. Nicki Reaper could prescribe something to help her sleep?   [place]

## 2017-03-18 NOTE — Telephone Encounter (Signed)
Discussed with pt. Pt verbalized understanding. Pt would like to try trazodone. Med sent to pharm.

## 2017-03-25 ENCOUNTER — Ambulatory Visit (INDEPENDENT_AMBULATORY_CARE_PROVIDER_SITE_OTHER): Payer: BC Managed Care – PPO | Admitting: *Deleted

## 2017-03-25 DIAGNOSIS — Z23 Encounter for immunization: Secondary | ICD-10-CM | POA: Diagnosis not present

## 2017-03-28 ENCOUNTER — Other Ambulatory Visit: Payer: Self-pay | Admitting: Nurse Practitioner

## 2017-03-28 MED ORDER — CLONAZEPAM 0.5 MG PO TABS: 0.5000 mg | ORAL_TABLET | Freq: Every day | ORAL | 0 refills | Status: DC

## 2017-04-23 ENCOUNTER — Other Ambulatory Visit: Payer: Self-pay | Admitting: Family Medicine

## 2017-05-12 ENCOUNTER — Other Ambulatory Visit: Payer: Self-pay | Admitting: Family Medicine

## 2017-05-12 NOTE — Telephone Encounter (Signed)
This with 2 refills does need follow-up office visit

## 2017-06-26 ENCOUNTER — Ambulatory Visit: Payer: BC Managed Care – PPO | Admitting: Family Medicine

## 2017-06-26 VITALS — Ht 59.0 in | Wt 138.0 lb

## 2017-06-26 DIAGNOSIS — M542 Cervicalgia: Secondary | ICD-10-CM | POA: Diagnosis not present

## 2017-06-26 DIAGNOSIS — Q761 Klippel-Feil syndrome: Secondary | ICD-10-CM | POA: Diagnosis not present

## 2017-06-26 MED ORDER — PREDNISONE 20 MG PO TABS: ORAL_TABLET | ORAL | 0 refills | Status: DC

## 2017-06-26 MED ORDER — TRAMADOL HCL 50 MG PO TABS: 50.0000 mg | ORAL_TABLET | Freq: Four times a day (QID) | ORAL | 0 refills | Status: DC | PRN

## 2017-06-26 NOTE — Progress Notes (Signed)
   Subjective:    Patient ID: [name], female    DOB: [birth year], 37 y.o.   MRN: [id number]   Neck Pain    This is a new problem. Episode onset: 3 days.  Associated symptoms comments: Left side neck pain, left shoulder pain, left hand pain and numbness. Treatments tried: ibuprofen, tylenol, deep blue essential oil, icy hot patch.   She relates pain in her neck pain in the trapezius pain down the arm also relates numbness tingling into the hand Denies any significant weakness  Review of Systems  Musculoskeletal: Positive for neck pain.  Denies chest pain shortness of breath nausea vomiting diarrhea denies low back pain H    Objective:   Physical Exam Lungs clear heart regular neck no masses trapezius mild tenderness left side        Assessment & Plan:  Cervical pain Prednisone taper Tramadol Stretching exercises If not dramatically better by next week will need MRI The patient will let us know next week how she is doing Has Klippel Brandon Melnick

## 2017-07-01 ENCOUNTER — Telehealth: Payer: Self-pay | Admitting: Family Medicine

## 2017-07-01 NOTE — Telephone Encounter (Signed)
Patient was seen on 1/10 for cervical pain and given prednisone 20 mg and states she hasnt slept in 48 hrs is that normal. She hasnt taken the tramadol 50 mg states prednisone is helping with pain.She has 4 days left but not sleeping and what do you suggest she do to help her sleep.

## 2017-07-01 NOTE — Telephone Encounter (Signed)
May use melatonin, 5 mg nightly as needed, may also use clonazepam for which she is already been prescribed-if she has return of neck pain over the next several weeks to let us know may end up needing MRI if worsening

## 2017-07-01 NOTE — Telephone Encounter (Signed)
Patient notified and verbalized understanding. 

## 2017-07-01 NOTE — Telephone Encounter (Signed)
Patient states her pain is so much better on the prednisone and no weakness-patient states she would like to just finish the prednisone as prescribed.

## 2017-07-01 NOTE — Telephone Encounter (Signed)
More than likely the prednisone is preventing her from sleeping we can taper this off in a faster format, please talk with the patient if she is on board with this suggestion I recommend taking 1 prednisone daily for the next 3 days then stopping-how is her pain and discomfort currently is it radiating down the arm any weakness?

## 2017-07-29 ENCOUNTER — Ambulatory Visit: Payer: BC Managed Care – PPO | Admitting: Family Medicine

## 2017-07-29 ENCOUNTER — Encounter: Payer: Self-pay | Admitting: Family Medicine

## 2017-07-29 VITALS — BP 108/74 | Temp 98.8°F | Ht 59.0 in | Wt 137.0 lb

## 2017-07-29 DIAGNOSIS — J111 Influenza due to unidentified influenza virus with other respiratory manifestations: Secondary | ICD-10-CM | POA: Diagnosis not present

## 2017-07-29 MED ORDER — PROMETHAZINE HCL 25 MG PO TABS: 25.0000 mg | ORAL_TABLET | Freq: Four times a day (QID) | ORAL | 0 refills | Status: DC | PRN

## 2017-07-29 MED ORDER — OSELTAMIVIR PHOSPHATE 75 MG PO CAPS: ORAL_CAPSULE | ORAL | 0 refills | Status: DC

## 2017-07-29 NOTE — Progress Notes (Signed)
   Subjective:    Patient ID: [name], female    DOB: 06-23-1980, 37 y.o.   MRN: [id number]  Sinusitis  This is a new problem. Episode onset: 2 days. Associated symptoms include coughing, ear pain, headaches and a sore throat. (Vomiting) Treatments tried: nyquil.   Two days ago developed frontal headache,  Cold and sinus meds otc didn't help much    neck pain diffusely and into the back  vom with gatorade   Took no meds yet               No fever   emnergy level low,   Appetite none       Review of Systems  HENT: Positive for ear pain and sore throat.   Respiratory: Positive for cough.   Neurological: Positive for headaches.       Objective:   Physical Exam  Alert vitals reviewed, moderate malaise. Hydration good. Positive nasal congestion lungs no crackles or wheezes, no tachypnea, intermittent bronchial cough during exam heart regular rate and rhythm.       Assessment & Plan:  Impression influenza discussed at length. Petra Kuba of illness and potential sequela discussed. Plan Tamiflu prescribed if indicated and timing appropriate. Symptom care discussed. Warning signs discussed. WSL

## 2017-07-30 ENCOUNTER — Encounter: Payer: Self-pay | Admitting: Family Medicine

## 2017-08-04 ENCOUNTER — Other Ambulatory Visit: Payer: Self-pay | Admitting: Family Medicine

## 2017-08-04 NOTE — Telephone Encounter (Signed)
May have this +3 refills 

## 2017-08-05 ENCOUNTER — Telehealth: Payer: Self-pay | Admitting: Family Medicine

## 2017-08-05 NOTE — Telephone Encounter (Signed)
Refill called into pharm. Pt notified.

## 2017-08-05 NOTE — Telephone Encounter (Signed)
Last seen 12/25/16 for anxiety

## 2017-08-05 NOTE — Telephone Encounter (Signed)
Pt called to see if Dr. Nicki Reaper gave OK for her Xanax refill to [facility]

## 2017-08-21 ENCOUNTER — Ambulatory Visit: Payer: BC Managed Care – PPO | Admitting: Family Medicine

## 2017-08-22 ENCOUNTER — Encounter: Payer: Self-pay | Admitting: Family Medicine

## 2017-08-22 ENCOUNTER — Ambulatory Visit: Payer: BC Managed Care – PPO | Admitting: Family Medicine

## 2017-08-22 VITALS — BP 128/86 | Temp 98.6°F | Ht 59.0 in | Wt 138.6 lb

## 2017-08-22 DIAGNOSIS — J329 Chronic sinusitis, unspecified: Secondary | ICD-10-CM | POA: Diagnosis not present

## 2017-08-22 DIAGNOSIS — J4520 Mild intermittent asthma, uncomplicated: Secondary | ICD-10-CM

## 2017-08-22 MED ORDER — HYDROCODONE-HOMATROPINE 5-1.5 MG/5ML PO SYRP: ORAL_SOLUTION | ORAL | 0 refills | Status: DC

## 2017-08-22 MED ORDER — ALBUTEROL SULFATE HFA 108 (90 BASE) MCG/ACT IN AERS: 1.0000 | INHALATION_SPRAY | Freq: Four times a day (QID) | RESPIRATORY_TRACT | 2 refills | Status: DC | PRN

## 2017-08-22 MED ORDER — PREDNISONE 20 MG PO TABS
ORAL_TABLET | ORAL | 0 refills | Status: DC
Start: 2017-08-22 — End: 2017-11-03

## 2017-08-22 MED ORDER — PROMETHAZINE HCL 25 MG PO TABS: ORAL_TABLET | ORAL | 0 refills | Status: DC

## 2017-08-22 MED ORDER — AMOXICILLIN-POT CLAVULANATE 875-125 MG PO TABS: 1.0000 | ORAL_TABLET | Freq: Two times a day (BID) | ORAL | 0 refills | Status: AC

## 2017-08-22 NOTE — Progress Notes (Signed)
   Subjective:    Patient ID: [name], female    DOB: [birth year], 37 y.o.   MRN: [id number]  HPI Pt here today for ear pain, no apettiete, trouble breathing, feels like an elephant is on chest, worse on left side and when lying down, green phlegm, cough, abdominal pain, headache, runny nose, wheezing. Pt states she went to use inhaler but it is out of date and would like a new prescription for it. Going on for 3 days  Cough started wed, progresssivel y worse  nyquil and xanax   Used an inhaler to rx,  Noted ear pain bilat  Stomach and back hurting from coughing   Feels miserable   Completely recovered form last month    Pt had walking pneumonia frequently way back when   nithing touching what's going on  Inhaler is old        Review of Systems No headache, no major weight loss or weight gain, no chest pain no back pain abdominal pain no change in bowel habits complete ROS otherwise negative     Objective:   Physical Exam Alert, mild malaise. Hydration good Vitals stable. frontal/ maxillary tenderness evident positive nasal congestion. pharynx normal neck supple  lungs clear/no crackles positive reactive airway wheezes. heart regular in rhythm        Assessment & Plan:  Impression rhinosinusitis/bronchitis with element of reactive airways likely post viral, discussed with patient. plan antibiotics prescribed. Questions answered. Symptomatic care discussed. warning signs discussed. WSL Antibiotics prescribed steroids prescribed inhaler prescribed symptom care discussed

## 2017-08-26 ENCOUNTER — Telehealth: Payer: Self-pay | Admitting: Family Medicine

## 2017-08-26 MED ORDER — BENZONATATE 100 MG PO CAPS: 100.0000 mg | ORAL_CAPSULE | Freq: Three times a day (TID) | ORAL | 0 refills | Status: DC | PRN

## 2017-08-26 MED ORDER — DOXYCYCLINE HYCLATE 100 MG PO TABS: 100.0000 mg | ORAL_TABLET | Freq: Two times a day (BID) | ORAL | 0 refills | Status: AC

## 2017-08-26 NOTE — Telephone Encounter (Signed)
Patient states she still feels bad. No fevers,but still has a productive cough yellow/green phelgm. Not sleeping well.Has some hycodan left,but she can not take during the day when working.[facility]

## 2017-08-26 NOTE — Telephone Encounter (Signed)
Patient is aware meds sent in to [facility].

## 2017-08-26 NOTE — Telephone Encounter (Signed)
Doxy 100 bid ten d , stop other antibiotic, forgot whether pt had ever take n tess perles 100 mg in past can try 30 one tid prn c ough if pedrsist, rec f u ov with dr Nicki Reaper

## 2017-08-26 NOTE — Telephone Encounter (Signed)
Pt called stating that she was seen on Friday by Dr. Richardson Landry. Pt states that her cough is not any better. Pt was given Hycodan and states that it is not touching the cough. Please advise.

## 2017-09-18 ENCOUNTER — Telehealth: Payer: Self-pay | Admitting: Family Medicine

## 2017-09-18 NOTE — Telephone Encounter (Signed)
Pt called stating that she would like to speak with a nurse regarding her inability to sleep. Pt states that nothing is wrong that would keep her from sleeping. Please advise.

## 2017-09-18 NOTE — Telephone Encounter (Signed)
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 Has tried Lavender, oils, exercise 2 hours prior to bed, hot showers, tried everything under the sun. Has Xanax but does not want use that as sleeping pill. Pt stated that she took one to try to sleep but  does not make her go to sleep.Been going on for almost a month. Cant think of anything that could trigger, has 2 kids, during the day points where she feels tired but lays down and cant sleep, has cut caffeine out in the past 2 weeks.

## 2017-09-18 NOTE — Telephone Encounter (Signed)
The current studies point toward cognitive behavioral therapy as the best way to help deal with chronic insomnia.  If she is interested in getting this type of counseling we can help refer her.  There is no perfect sleep medicine.  All of them have potential for either not working or causing addictive issues or a person becoming dependent on the medicine.  Currently on her med list is trazodone.  I assume it is not working or helping?  If so it needs to be discontinued.  We can try low-dose Seroquel if she is interested I do not recommend hypnotic sleep pills because of helping take care of children and the risk of sleepwalking.  Ultimately the patient may need to do a follow-up office visit to help discuss these possibilities in detail.  Also if the patient is interested we can refer her to a sleep medicine specialist to evaluate for a sleep disorder.  Once again patient may well need to do an office visit to get more clear guidance-these are not simple phone calls

## 2017-09-19 ENCOUNTER — Other Ambulatory Visit: Payer: Self-pay | Admitting: Family Medicine

## 2017-09-19 MED ORDER — RAMELTEON 8 MG PO TABS: ORAL_TABLET | ORAL | 1 refills | Status: DC

## 2017-09-19 NOTE — Telephone Encounter (Signed)
Contacted patient; she states she would like to try low dose Seroquel. She would like to try this and then come in for office visit. She is not taking the Trazodone due to it not working. She is willing to get the cognitive behavioral therapy and is willing to be evaluated by a sleep med specialist. What dose Seroquel? How soon should she follow up? Please advise. Thanks.

## 2017-09-19 NOTE — Telephone Encounter (Signed)
Patient called and said that the Ramelteon that was called in is not covered by insurance.  She would like an alternative.  [place]

## 2017-09-19 NOTE — Telephone Encounter (Signed)
1.  Unfortunately let the patient know that the low-dose Seroquel that used to be available is no longer available.  The current dose would be too strong for this patient.  #2 I agree with her that we do not want to use Xanax to help sleep #3 I recommend tryingRozerem 8 mg 1 each evening as needed for sleep this stimulates melatonin release therefore it is considered to be a safe medication.  If it is too strong for her she might want to try half of a tablet.  #3 I do agree with consultation with a sleep specialist [place] #4 I also recommend cognitive behavioral therapy for insomnia please proceed forward if patient is having any additional issues or problems please follow-up office visit

## 2017-09-19 NOTE — Telephone Encounter (Signed)
Spoke with patient; sent med into [facility]. Pt states she would like to try the medication before doing therapy but she is willing to do the therapy.

## 2017-09-19 NOTE — Telephone Encounter (Signed)
Patient called to check on this message.

## 2017-09-22 ENCOUNTER — Telehealth: Payer: Self-pay | Admitting: Family Medicine

## 2017-09-22 MED ORDER — ZOLPIDEM TARTRATE 5 MG PO TABS: 5.0000 mg | ORAL_TABLET | Freq: Every evening | ORAL | 1 refills | Status: DC | PRN

## 2017-09-22 NOTE — Telephone Encounter (Signed)
Pt is aware we have called into [facility].

## 2017-09-22 NOTE — Telephone Encounter (Signed)
Patient state she has used Ambien in the past and would like that called into [facility].

## 2017-09-22 NOTE — Telephone Encounter (Signed)
Unfortunately there are no great medications for sleep.  Only other options that are left are either medications similar to Ambien or Lunesta for which she would only take on evenings where the kids are not with her or a low-dose nerve medication such as lorazepam-but that medication can be habit-forming so it is not meant for every single night use, also the cognitive behavioral therapy patient has been referred for this hopefully that will help her sleep.  If any of the previous medicine she would like to try please let us know and I can do a prescription for them

## 2017-09-22 NOTE — Telephone Encounter (Signed)
Ambien 5 mg, #30, 1 nightly as needed, 1 refill, very important to give at least 8 hours for sleep-even if the medication only makes her sleep a few hours no driving for at least 8 hours after taking the medication

## 2017-09-22 NOTE — Telephone Encounter (Signed)
I called and left a message that we need to have the name of the medication.Please r/c.

## 2017-09-22 NOTE — Telephone Encounter (Signed)
Pt states  The rozerem is not covered. Please prescribe something else.

## 2017-09-22 NOTE — Telephone Encounter (Signed)
Patient states last prescription for sleep aid was too expensive and needing something else called in to help her sleep that her insurance will cover. [place]

## 2017-09-22 NOTE — Telephone Encounter (Signed)
Please advise 

## 2017-11-03 ENCOUNTER — Encounter: Payer: Self-pay | Admitting: Adult Health

## 2017-11-03 ENCOUNTER — Encounter (INDEPENDENT_AMBULATORY_CARE_PROVIDER_SITE_OTHER): Payer: Self-pay

## 2017-11-03 ENCOUNTER — Ambulatory Visit: Payer: BC Managed Care – PPO | Admitting: Adult Health

## 2017-11-03 VITALS — BP 120/82 | HR 83 | Ht 60.0 in | Wt 142.0 lb

## 2017-11-03 DIAGNOSIS — N951 Menopausal and female climacteric states: Secondary | ICD-10-CM

## 2017-11-03 DIAGNOSIS — R232 Flushing: Secondary | ICD-10-CM | POA: Diagnosis not present

## 2017-11-03 DIAGNOSIS — R4586 Emotional lability: Secondary | ICD-10-CM | POA: Diagnosis not present

## 2017-11-03 MED ORDER — NORETHIN-ETH ESTRAD-FE BIPHAS 1 MG-10 MCG / 10 MCG PO TABS: 1.0000 | ORAL_TABLET | Freq: Every day | ORAL | 11 refills | Status: DC

## 2017-11-03 NOTE — Progress Notes (Signed)
  Subjective:     Patient ID: [name], female   DOB: [birth year], 37 y.o.   MRN: [id number]  HPI [name] is a 37 year old white female, divorced in complaining of hot flashes, mood swings and night sweats and not sleeping well.Her mom started menopause at about age 75 and she does not want to be like her mom or a "bitch". She is sp tubal and ablation, and ablation did not work still having periods.   Review of Systems +hot flashes +mood swings +night sweats +not sleeping well Reviewed past medical,surgical, social and family history. Reviewed medications and allergies.     Objective:   Physical Exam BP 120/82 (BP Location: Left Arm, Patient Position: Sitting, Cuff Size: Normal)   Pulse 83   Ht 5' (1.524 m)   Wt 142 lb (64.4 kg)   LMP 10/26/2017   BMI 27.73 kg/m  Skin warm and dry. Neck: mid line trachea, normal thyroid, good ROM, no lymphadenopathy noted. Lungs: clear to ausculation bilaterally. Cardiovascular: regular rate and rhythm. Will try low dose OC to see if helps moods and flashes and sleeping, if not may add SSRI.    Assessment:     1. Hot flashes   2. Mood swings   3. Perimenopause       Plan:     Will start lo loestrin today, 2 packs given Meds ordered this encounter  Medications  . Norethindrone-Ethinyl Estradiol-Fe Biphas (LO LOESTRIN FE) 1 MG-10 MCG / 10 MCG tablet    Sig: Take 1 tablet by mouth daily. Take 1 daily by mouth    Dispense:  1 Package    Refill:  11    BIN [id number], PCN [id number], GRP [id number]    Order Specific Question:   Supervising Provider    Answer:   Tania Ade H [[id number]]  Check CBC,CMP,TSH and [place] today F/U in 4 weeks  Review handouts on perimenopause and menopause

## 2017-11-03 NOTE — Patient Instructions (Signed)
Menopause Menopause is the normal time of life when menstrual periods stop completely. Menopause is complete when you have missed 12 consecutive menstrual periods. It usually occurs between the ages of 48 years and 55 years. Very rarely does a woman develop menopause before the age of 40 years. At menopause, your ovaries stop producing the female hormones estrogen and progesterone. This can cause undesirable symptoms and also affect your health. Sometimes the symptoms may occur 4-5 years before the menopause begins. There is no relationship between menopause and:  Oral contraceptives.  Number of children you had.  Race.  The age your menstrual periods started (menarche).  Heavy smokers and very thin women may develop menopause earlier in life. What are the causes?  The ovaries stop producing the female hormones estrogen and progesterone. Other causes include:  Surgery to remove both ovaries.  The ovaries stop functioning for no known reason.  Tumors of the pituitary gland in the brain.  Medical disease that affects the ovaries and hormone production.  Radiation treatment to the abdomen or pelvis.  Chemotherapy that affects the ovaries.  What are the signs or symptoms?  Hot flashes.  Night sweats.  Decrease in sex drive.  Vaginal dryness and thinning of the vagina causing painful intercourse.  Dryness of the skin and developing wrinkles.  Headaches.  Tiredness.  Irritability.  Memory problems.  Weight gain.  Bladder infections.  Hair growth of the face and chest.  Infertility. More serious symptoms include:  Loss of bone (osteoporosis) causing breaks (fractures).  Depression.  Hardening and narrowing of the arteries (atherosclerosis) causing heart attacks and strokes.  How is this diagnosed?  When the menstrual periods have stopped for 12 straight months.  Physical exam.  Hormone studies of the blood. How is this treated? There are many treatment  choices and nearly as many questions about them. The decisions to treat or not to treat menopausal changes is an individual choice made with your health care provider. Your health care provider can discuss the treatments with you. Together, you can decide which treatment will work best for you. Your treatment choices may include:  Hormone therapy (estrogen and progesterone).  Non-hormonal medicines.  Treating the individual symptoms with medicine (for example antidepressants for depression).  Herbal medicines that may help specific symptoms.  Counseling by a psychiatrist or psychologist.  Group therapy.  Lifestyle changes including: ? Eating healthy. ? Regular exercise. ? Limiting caffeine and alcohol. ? Stress management and meditation.  No treatment.  Follow these instructions at home:  Take the medicine your health care provider gives you as directed.  Get plenty of sleep and rest.  Exercise regularly.  Eat a diet that contains calcium (good for the bones) and soy products (acts like estrogen hormone).  Avoid alcoholic beverages.  Do not smoke.  If you have hot flashes, dress in layers.  Take supplements, calcium, and vitamin D to strengthen bones.  You can use over-the-counter lubricants or moisturizers for vaginal dryness.  Group therapy is sometimes very helpful.  Acupuncture may be helpful in some cases. Contact a health care provider if:  You are not sure you are in menopause.  You are having menopausal symptoms and need advice and treatment.  You are still having menstrual periods after age 55 years.  You have pain with intercourse.  Menopause is complete (no menstrual period for 12 months) and you develop vaginal bleeding.  You need a referral to a specialist (gynecologist, psychiatrist, or psychologist) for treatment. Get help right   away if:  You have severe depression.  You have excessive vaginal bleeding.  You fell and think you have a  broken bone.  You have pain when you urinate.  You develop leg or chest pain.  You have a fast pounding heart beat (palpitations).  You have severe headaches.  You develop vision problems.  You feel a lump in your breast.  You have abdominal pain or severe indigestion. This information is not intended to replace advice given to you by your health care provider. Make sure you discuss any questions you have with your health care provider. Document Released: 08/24/2003 Document Revised: 11/09/2015 Document Reviewed: 12/31/2012 Elsevier Interactive Patient Education  2017 [place] is the time when your body begins to move into the menopause (no menstrual period for 12 straight months). It is a natural process. Perimenopause can begin 2-8 years before the menopause and usually lasts for 1 year after the menopause. During this time, your ovaries may or may not produce an egg. The ovaries vary in their production of estrogen and progesterone hormones each month. This can cause irregular menstrual periods, difficulty getting pregnant, vaginal bleeding between periods, and uncomfortable symptoms. What are the causes?  Irregular production of the ovarian hormones, estrogen and progesterone, and not ovulating every month. Other causes include:  Tumor of the pituitary gland in the brain.  Medical disease that affects the ovaries.  Radiation treatment.  Chemotherapy.  Unknown causes.  Heavy smoking and excessive alcohol intake can bring on perimenopause sooner.  What are the signs or symptoms?  Hot flashes.  Night sweats.  Irregular menstrual periods.  Decreased sex drive.  Vaginal dryness.  Headaches.  Mood swings.  Depression.  Memory problems.  Irritability.  Tiredness.  Weight gain.  Trouble getting pregnant.  The beginning of losing bone cells (osteoporosis).  The beginning of hardening of the arteries  (atherosclerosis). How is this diagnosed? Your health care provider will make a diagnosis by analyzing your age, menstrual history, and symptoms. He or she will do a physical exam and note any changes in your body, especially your female organs. Female hormone tests may or may not be helpful depending on the amount of female hormones you produce and when you produce them. However, other hormone tests may be helpful to rule out other problems. How is this treated? In some cases, no treatment is needed. The decision on whether treatment is necessary during the perimenopause should be made by you and your health care provider based on how the symptoms are affecting you and your lifestyle. Various treatments are available, such as:  Treating individual symptoms with a specific medicine for that symptom.  Herbal medicines that can help specific symptoms.  Counseling.  Group therapy.  Follow these instructions at home:  Keep track of your menstrual periods (when they occur, how heavy they are, how long between periods, and how long they last) as well as your symptoms and when they started.  Only take over-the-counter or prescription medicines as directed by your health care provider.  Sleep and rest.  Exercise.  Eat a diet that contains calcium (good for your bones) and soy (acts like the estrogen hormone).  Do not smoke.  Avoid alcoholic beverages.  Take vitamin supplements as recommended by your health care provider. Taking vitamin E may help in certain cases.  Take calcium and vitamin D supplements to help prevent bone loss.  Group therapy is sometimes helpful.  Acupuncture may help in some cases. Contact a  health care provider if:  You have questions about any symptoms you are having.  You need a referral to a specialist (gynecologist, psychiatrist, or psychologist). Get help right away if:  You have vaginal bleeding.  Your period lasts longer than 8 days.  Your periods  are recurring sooner than 21 days.  You have bleeding after intercourse.  You have severe depression.  You have pain when you urinate.  You have severe headaches.  You have vision problems. This information is not intended to replace advice given to you by your health care provider. Make sure you discuss any questions you have with your health care provider. Document Released: 07/11/2004 Document Revised: 11/09/2015 Document Reviewed: 12/31/2012 Elsevier Interactive Patient Education  2017 Elsevier Inc.  

## 2017-11-04 ENCOUNTER — Encounter: Payer: Self-pay | Admitting: Adult Health

## 2017-11-04 ENCOUNTER — Telehealth: Payer: Self-pay | Admitting: Adult Health

## 2017-11-04 DIAGNOSIS — E039 Hypothyroidism, unspecified: Secondary | ICD-10-CM

## 2017-11-04 HISTORY — DX: Hypothyroidism, unspecified: E03.9

## 2017-11-04 LAB — COMPREHENSIVE METABOLIC PANEL
ALT: 15 IU/L (ref 0–32)
AST: 17 IU/L (ref 0–40)
Albumin/Globulin Ratio: 1.6 (ref 1.2–2.2)
Albumin: 4.4 g/dL (ref 3.5–5.5)
Alkaline Phosphatase: 82 IU/L (ref 39–117)
BUN/Creatinine Ratio: 15 (ref 9–23)
BUN: 10 mg/dL (ref 6–20)
Bilirubin Total: <0.2 mg/dL (ref 0.0–1.2)
CO2: 22 mmol/L (ref 20–29)
Calcium: 9.3 mg/dL (ref 8.7–10.2)
Chloride: 101 mmol/L (ref 96–106)
Creatinine, Ser: 0.65 mg/dL (ref 0.57–1.00)
GFR calc Af Amer: 130 mL/min/{1.73_m2} (ref >59)
GFR calc non Af Amer: 113 mL/min/{1.73_m2} (ref >59)
Globulin, Total: 2.7 g/dL (ref 1.5–4.5)
Glucose: 91 mg/dL (ref 65–99)
Potassium: 4.3 mmol/L (ref 3.5–5.2)
Sodium: 140 mmol/L (ref 134–144)
Total Protein: 7.1 g/dL (ref 6.0–8.5)

## 2017-11-04 LAB — TSH: TSH: 6.01 u[IU]/mL — ABNORMAL HIGH (ref 0.450–4.500)

## 2017-11-04 LAB — CBC
Hematocrit: 42.9 % (ref 34.0–46.6)
Hemoglobin: 13.8 g/dL (ref 11.1–15.9)
MCH: 29.9 pg (ref 26.6–33.0)
MCHC: 32.2 g/dL (ref 31.5–35.7)
MCV: 93 fL (ref 79–97)
Platelets: 388 10*3/uL (ref 150–450)
RBC: 4.61 x10E6/uL (ref 3.77–5.28)
RDW: 13.7 % (ref 12.3–15.4)
WBC: 6 10*3/uL (ref 3.4–10.8)

## 2017-11-04 LAB — FOLLICLE STIMULATING HORMONE: FSH: 5.8 m[IU]/mL

## 2017-11-04 MED ORDER — LEVOTHYROXINE SODIUM 25 MCG PO TABS
25.0000 ug | ORAL_TABLET | Freq: Every day | ORAL | 2 refills | Status: DC
Start: 2017-11-04 — End: 2018-01-07

## 2017-11-04 NOTE — Telephone Encounter (Signed)
Pt aware of labs and elevated TSH, will start on synthroid 25 mcg 1 daily. Recheck labs in 8 weeks

## 2017-11-13 ENCOUNTER — Telehealth: Payer: Self-pay | Admitting: *Deleted

## 2017-11-13 MED ORDER — PREDNISONE 20 MG PO TABS: ORAL_TABLET | ORAL | 0 refills | Status: DC

## 2017-11-13 NOTE — Telephone Encounter (Signed)
Adult pred taper 

## 2017-11-13 NOTE — Telephone Encounter (Signed)
Prescription sent electronically to pharmacy  Left message to return call 

## 2017-11-13 NOTE — Telephone Encounter (Signed)
Patient called stating she done some yard work and got into [employer] and it is all over her legs and left hand. Patient would like something called into [facility]. Please advise

## 2017-11-17 ENCOUNTER — Telehealth: Payer: Self-pay | Admitting: Adult Health

## 2017-11-17 NOTE — Telephone Encounter (Signed)
Would like for [name] to call her tomorrow in ref to some meds she has her on

## 2017-11-17 NOTE — Telephone Encounter (Signed)
Pt just started synthroid and COCs, started spotting during active week of pills, wanted to make sure everything was ok. Discussed sometimes it can take a few packs to regulate periods. Continue pills.  Roma Schanz, CNM, [facility] 11/17/2017 1:43 PM

## 2017-11-18 ENCOUNTER — Encounter: Payer: Self-pay | Admitting: Family Medicine

## 2017-11-18 ENCOUNTER — Ambulatory Visit: Payer: BC Managed Care – PPO | Admitting: Family Medicine

## 2017-11-18 VITALS — BP 118/76 | Ht 60.0 in | Wt 139.0 lb

## 2017-11-18 DIAGNOSIS — F411 Generalized anxiety disorder: Secondary | ICD-10-CM | POA: Diagnosis not present

## 2017-11-18 MED ORDER — ALPRAZOLAM 0.5 MG PO TABS: ORAL_TABLET | ORAL | 2 refills | Status: DC

## 2017-11-18 MED ORDER — CITALOPRAM HYDROBROMIDE 10 MG PO TABS: 10.0000 mg | ORAL_TABLET | Freq: Every day | ORAL | 2 refills | Status: DC

## 2017-11-18 NOTE — Progress Notes (Signed)
   Subjective:    Patient ID: [name], female    DOB: 08-Mar-1981, 37 y.o.   MRN: [id number]  HPI Significant stress related issues she is separated she does a great job with her kids she tries to get along well with her separated husband in their family but has a lot of stress her family lives up in [place] so she does not get to see them as often patient at times feels very stressed out but denies being suicidal does have some depression symptoms as well as anxiety symptoms  Patient would like to see a counselor she is often also open to trying medication she states she does use Xanax intermittently but relates that she has been using it more frequently recently and this is causing some issues that she does not want to progress in taking Patient arrives to discuss anxiety. Review of Systems Please see above denies any chest tightness pressure pain shortness breath nausea vomiting diarrhea headaches    Objective:   Physical Exam Lungs clear respiratory rate normal heart regular no murmurs    Office Visit from 11/18/2017 in [place]  PHQ-9 Total Score  11     GAD 7 : Generalized Anxiety Score 11/18/2017  Nervous, Anxious, on Edge 3  Control/stop worrying 2  Worry too much - different things 2  Trouble relaxing 3  Restless 2  Easily annoyed or irritable 2  Afraid - awful might happen 1  Total GAD 7 Score 15  Anxiety Difficulty Somewhat difficult          Assessment & Plan:  Significant stress related issues Combination of life plus also separation She would benefit from a counselor on a ongoing basis She would also benefit from low-dose SSRI She did not tolerate Zoloft or Lexapro We will try low-dose Celexa Patient was cautioned regarding nausea the first week but after that should get better She is to call us if ongoing troubles or worse She will give [place] an update in a couple weeks time she will follow-up in approximately 6 weeks time

## 2017-11-19 ENCOUNTER — Telehealth: Payer: Self-pay | Admitting: Family Medicine

## 2017-11-19 ENCOUNTER — Other Ambulatory Visit: Payer: Self-pay

## 2017-11-19 MED ORDER — ALPRAZOLAM 0.5 MG PO TABS: ORAL_TABLET | ORAL | 2 refills | Status: DC

## 2017-11-19 NOTE — Addendum Note (Signed)
Addended by: Dairl Ponder on: 11/19/2017 08:29 AM   Modules accepted: Orders

## 2017-11-19 NOTE — Telephone Encounter (Signed)
Thank you :)

## 2017-11-19 NOTE — Telephone Encounter (Signed)
Patient contacted office to inform us that Tammy at [facility] stated that it was to early to fill the Xanax script that was given yesterday. Pt stated that Tammy at [facility] stated that if provider would write out another script to be filled earlier, that would be acceptable. Pt states she is leaving for vacation and needs these before she leaves. Pt states she will be in later this evening due to daughter having an appointment.

## 2017-11-19 NOTE — Telephone Encounter (Signed)
Patient is requesting a nurse to give her a call back to discuss her appointment she had yesterday.

## 2017-11-19 NOTE — Telephone Encounter (Signed)
Contacted pharmacy and asked them if when last refill was and it was 10/30/2017. Jonni Sanger stated that if this is the same dose we can just call and inform him that it can filled early.

## 2017-11-19 NOTE — Telephone Encounter (Signed)
Please change the directions to be Xanax 0.5 mg 1 twice daily as needed cautioned drowsiness not for frequent use #30, this may be filled early-please call pharmacy with change in directions

## 2017-11-19 NOTE — Progress Notes (Signed)
Referral ordered in [facility]. 

## 2017-11-19 NOTE — Telephone Encounter (Signed)
Informed patient and contacted pharmacy with updated directions.

## 2017-12-04 ENCOUNTER — Encounter: Payer: Self-pay | Admitting: Adult Health

## 2017-12-04 ENCOUNTER — Ambulatory Visit: Payer: BC Managed Care – PPO | Admitting: Adult Health

## 2017-12-04 VITALS — BP 125/82 | HR 91 | Ht 59.0 in | Wt 138.0 lb

## 2017-12-04 DIAGNOSIS — E039 Hypothyroidism, unspecified: Secondary | ICD-10-CM | POA: Diagnosis not present

## 2017-12-04 DIAGNOSIS — N920 Excessive and frequent menstruation with regular cycle: Secondary | ICD-10-CM

## 2017-12-04 NOTE — Progress Notes (Signed)
  Subjective:     Patient ID: [name], female   DOB: April 18, 1981, 37 y.o.   MRN: [id number]  HPI [name] is a 37 white female, back in follow up on starting lo loestrin and synthroid,and feels much better.   Review of Systems Having irregular bleeding on OCs Hair loss better Moods better No hot flashes,sleeping better  Feels better over all Reviewed past medical,surgical, social and family history. Reviewed medications and allergies.     Objective:   Physical Exam BP 125/82 (BP Location: Left Arm, Patient Position: Sitting, Cuff Size: Small)   Pulse 91   Ht 4\' 11"  (1.499 m)   Wt 138 lb (62.6 kg)   LMP 12/02/2017   BMI 27.87 kg/m   Talk only:she says she feels so much better, is more active and has stopped drinking soda and trying to decrease sugar and has lost about 4 lbs.     Assessment:     1. Hypothyroidism, unspecified type   2. Spotting       Plan:     Continue synthroid and lo leostrin has refills Check TSH and free T4 about 01/05/18 F/U in 3 months

## 2017-12-25 ENCOUNTER — Telehealth: Payer: Self-pay | Admitting: Adult Health

## 2017-12-25 NOTE — Telephone Encounter (Signed)
Feels hyper, not sure if synthroid or anxiety over school starting back, check labs next week

## 2018-01-05 ENCOUNTER — Other Ambulatory Visit: Payer: Self-pay | Admitting: Family Medicine

## 2018-01-05 NOTE — Telephone Encounter (Signed)
3 rf

## 2018-01-06 LAB — T4, FREE: Free T4: 1.11 ng/dL (ref 0.82–1.77)

## 2018-01-06 LAB — TSH: TSH: 3.43 u[IU]/mL (ref 0.450–4.500)

## 2018-01-07 ENCOUNTER — Telehealth: Payer: Self-pay | Admitting: Adult Health

## 2018-01-07 MED ORDER — LEVOTHYROXINE SODIUM 50 MCG PO TABS: 50.0000 ug | ORAL_TABLET | Freq: Every day | ORAL | 2 refills | Status: DC

## 2018-01-07 NOTE — Telephone Encounter (Signed)
Pt aware of labs, will increase synthroid to 50 mcg, and recheck TSH and Free T4 in about 8-12 weeks,feels better

## 2018-03-04 ENCOUNTER — Telehealth: Payer: Self-pay | Admitting: Adult Health

## 2018-03-04 NOTE — Telephone Encounter (Signed)
Pt wanted me to let Anderson Malta know that everything is working great!!

## 2018-03-05 ENCOUNTER — Ambulatory Visit: Payer: BC Managed Care – PPO | Admitting: Adult Health

## 2018-03-05 NOTE — Telephone Encounter (Signed)
Left message that I am glad she is good, but let's check labs in October

## 2018-03-10 ENCOUNTER — Other Ambulatory Visit: Payer: Self-pay | Admitting: Family Medicine

## 2018-03-11 ENCOUNTER — Telehealth: Payer: Self-pay | Admitting: Family Medicine

## 2018-03-11 ENCOUNTER — Other Ambulatory Visit: Payer: Self-pay

## 2018-03-11 MED ORDER — ALPRAZOLAM 0.5 MG PO TABS: ORAL_TABLET | ORAL | 5 refills | Status: DC

## 2018-03-11 NOTE — Telephone Encounter (Signed)
This is a duplicate has been filled

## 2018-03-11 NOTE — Telephone Encounter (Signed)
Patient is requesting refill on xanax 0.5 called into [facility]

## 2018-03-11 NOTE — Telephone Encounter (Signed)
Script printed and signed. Called into [facility]. Left message to return call to inform patient

## 2018-03-11 NOTE — Telephone Encounter (Signed)
Patient may have this +5 refills

## 2018-03-11 NOTE — Telephone Encounter (Signed)
Last seen 11/18/2017 for GAD

## 2018-03-16 NOTE — Telephone Encounter (Signed)
Patient picked up prescription 03/12/18 per [place].

## 2018-04-01 ENCOUNTER — Telehealth: Payer: Self-pay | Admitting: Family Medicine

## 2018-04-01 NOTE — Telephone Encounter (Signed)
Please advise. Thank you

## 2018-04-01 NOTE — Telephone Encounter (Signed)
Discussed with pt. Pt verbalized understanding.  °

## 2018-04-01 NOTE — Telephone Encounter (Signed)
Needs advice:  Vomitting Friday, felt better Saturday, tired on Sunday, achey on Monday, and today diarrhea.  Not sure if she should just wait it out or make an appt or what.

## 2018-04-01 NOTE — Telephone Encounter (Signed)
More than likely a viral illness May use Imodium as needed Tylenol as needed If over the next 24 to 36 hours things are getting worse I highly recommend giving [place] a call either Thursday morning or Friday morning and we can work her in May have work note if needed [employer] clear liquids recommended If bloody stools high fevers or worse definitely get seen for office visit

## 2018-04-01 NOTE — Telephone Encounter (Signed)
Left message to return call 

## 2018-04-02 ENCOUNTER — Other Ambulatory Visit: Payer: BC Managed Care – PPO

## 2018-04-02 DIAGNOSIS — E039 Hypothyroidism, unspecified: Secondary | ICD-10-CM

## 2018-04-03 LAB — T4, FREE: Free T4: 1.28 ng/dL (ref 0.82–1.77)

## 2018-04-03 LAB — TSH: TSH: 2.04 u[IU]/mL (ref 0.450–4.500)

## 2018-04-06 ENCOUNTER — Telehealth: Payer: Self-pay | Admitting: Adult Health

## 2018-04-06 MED ORDER — LEVOTHYROXINE SODIUM 50 MCG PO TABS: 50.0000 ug | ORAL_TABLET | Freq: Every day | ORAL | 12 refills | Status: DC

## 2018-04-06 NOTE — Telephone Encounter (Signed)
Pt aware that labs are good, will refill synthroid for 1 year

## 2018-04-10 ENCOUNTER — Encounter: Payer: Self-pay | Admitting: Family Medicine

## 2018-04-10 ENCOUNTER — Ambulatory Visit: Payer: BC Managed Care – PPO | Admitting: Family Medicine

## 2018-04-10 VITALS — BP 136/96 | Temp 98.1°F | Ht 60.0 in | Wt 141.1 lb

## 2018-04-10 DIAGNOSIS — J019 Acute sinusitis, unspecified: Secondary | ICD-10-CM | POA: Diagnosis not present

## 2018-04-10 DIAGNOSIS — R030 Elevated blood-pressure reading, without diagnosis of hypertension: Secondary | ICD-10-CM

## 2018-04-10 MED ORDER — DOXYCYCLINE HYCLATE 100 MG PO TABS: 100.0000 mg | ORAL_TABLET | Freq: Two times a day (BID) | ORAL | 0 refills | Status: DC
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
## 2018-04-10 NOTE — Patient Instructions (Signed)
DASH Eating Plan DASH stands for "Dietary Approaches to Stop Hypertension." The DASH eating plan is a healthy eating plan that has been shown to reduce high blood pressure (hypertension). It may also reduce your risk for type 2 diabetes, heart disease, and stroke. The DASH eating plan may also help with weight loss. What are tips for following this plan? General guidelines  Avoid eating more than 2,300 mg (milligrams) of salt (sodium) a day. If you have hypertension, you may need to reduce your sodium intake to 1,500 mg a day.  Limit alcohol intake to no more than 1 drink a day for nonpregnant women and 2 drinks a day for men. One drink equals 12 oz of beer, 5 oz of wine, or 1 oz of hard liquor.  Work with your health care provider to maintain a healthy body weight or to lose weight. Ask what an ideal weight is for you.  Get at least 30 minutes of exercise that causes your heart to beat faster (aerobic exercise) most days of the week. Activities may include walking, swimming, or biking.  Work with your health care provider or diet and nutrition specialist (dietitian) to adjust your eating plan to your individual calorie needs. Reading food labels  Check food labels for the amount of sodium per serving. Choose foods with less than 5 percent of the Daily Value of sodium. Generally, foods with less than 300 mg of sodium per serving fit into this eating plan.  To find whole grains, look for the word "whole" as the first word in the ingredient list. Shopping  Buy products labeled as "low-sodium" or "no salt added."  Buy fresh foods. Avoid canned foods and premade or frozen meals. Cooking  Avoid adding salt when cooking. Use salt-free seasonings or herbs instead of table salt or sea salt. Check with your health care provider or pharmacist before using salt substitutes.  Do not fry foods. Cook foods using healthy methods such as baking, boiling, grilling, and broiling instead.  Cook with  heart-healthy oils, such as olive, canola, soybean, or sunflower oil. Meal planning   Eat a balanced diet that includes: ? 5 or more servings of fruits and vegetables each day. At each meal, try to fill half of your plate with fruits and vegetables. ? Up to 6-8 servings of whole grains each day. ? Less than 6 oz of lean meat, poultry, or fish each day. A 3-oz serving of meat is about the same size as a deck of cards. One egg equals 1 oz. ? 2 servings of low-fat dairy each day. ? A serving of nuts, seeds, or beans 5 times each week. ? Heart-healthy fats. Healthy fats called Omega-3 fatty acids are found in foods such as flaxseeds and coldwater fish, like sardines, salmon, and mackerel.  Limit how much you eat of the following: ? Canned or prepackaged foods. ? Food that is high in trans fat, such as fried foods. ? Food that is high in saturated fat, such as fatty meat. ? Sweets, desserts, sugary drinks, and other foods with added sugar. ? Full-fat dairy products.  Do not salt foods before eating.  Try to eat at least 2 vegetarian meals each week.  Eat more home-cooked food and less restaurant, buffet, and fast food.  When eating at a restaurant, ask that your food be prepared with less salt or no salt, if possible. What foods are recommended? The items listed may not be a complete list. Talk with your dietitian about what   dietary choices are best for you. Grains Whole-grain or whole-wheat bread. Whole-grain or whole-wheat pasta. Brown rice. Oatmeal. Quinoa. Bulgur. Whole-grain and low-sodium cereals. Pita bread. Low-fat, low-sodium crackers. Whole-wheat flour tortillas. Vegetables Fresh or frozen vegetables (raw, steamed, roasted, or grilled). Low-sodium or reduced-sodium tomato and vegetable juice. Low-sodium or reduced-sodium tomato sauce and tomato paste. Low-sodium or reduced-sodium canned vegetables. Fruits All fresh, dried, or frozen fruit. Canned fruit in natural juice (without  added sugar). Meat and other protein foods Skinless chicken or turkey. Ground chicken or turkey. Pork with fat trimmed off. Fish and seafood. Egg whites. Dried beans, peas, or lentils. Unsalted nuts, nut butters, and seeds. Unsalted canned beans. Lean cuts of beef with fat trimmed off. Low-sodium, lean deli meat. Dairy Low-fat (1%) or fat-free (skim) milk. Fat-free, low-fat, or reduced-fat cheeses. Nonfat, low-sodium ricotta or cottage cheese. Low-fat or nonfat yogurt. Low-fat, low-sodium cheese. Fats and oils Soft margarine without trans fats. Vegetable oil. Low-fat, reduced-fat, or light mayonnaise and salad dressings (reduced-sodium). Canola, safflower, olive, soybean, and sunflower oils. Avocado. Seasoning and other foods Herbs. Spices. Seasoning mixes without salt. Unsalted popcorn and pretzels. Fat-free sweets. What foods are not recommended? The items listed may not be a complete list. Talk with your dietitian about what dietary choices are best for you. Grains Baked goods made with fat, such as croissants, muffins, or some breads. Dry pasta or rice meal packs. Vegetables Creamed or fried vegetables. Vegetables in a cheese sauce. Regular canned vegetables (not low-sodium or reduced-sodium). Regular canned tomato sauce and paste (not low-sodium or reduced-sodium). Regular tomato and vegetable juice (not low-sodium or reduced-sodium). Pickles. Olives. Fruits Canned fruit in a light or heavy syrup. Fried fruit. Fruit in cream or butter sauce. Meat and other protein foods Fatty cuts of meat. Ribs. Fried meat. Bacon. Sausage. Bologna and other processed lunch meats. Salami. Fatback. Hotdogs. Bratwurst. Salted nuts and seeds. Canned beans with added salt. Canned or smoked fish. Whole eggs or egg yolks. Chicken or turkey with skin. Dairy Whole or 2% milk, cream, and half-and-half. Whole or full-fat cream cheese. Whole-fat or sweetened yogurt. Full-fat cheese. Nondairy creamers. Whipped toppings.  Processed cheese and cheese spreads. Fats and oils Butter. Stick margarine. Lard. Shortening. Ghee. Bacon fat. Tropical oils, such as coconut, palm kernel, or palm oil. Seasoning and other foods Salted popcorn and pretzels. Onion salt, garlic salt, seasoned salt, table salt, and sea salt. Worcestershire sauce. Tartar sauce. Barbecue sauce. Teriyaki sauce. Soy sauce, including reduced-sodium. Steak sauce. Canned and packaged gravies. Fish sauce. Oyster sauce. Cocktail sauce. Horseradish that you find on the shelf. Ketchup. Mustard. Meat flavorings and tenderizers. Bouillon cubes. Hot sauce and Tabasco sauce. Premade or packaged marinades. Premade or packaged taco seasonings. Relishes. Regular salad dressings. Where to find more information:  National Heart, Lung, and Blood Institute: www.nhlbi.nih.gov  American Heart Association: www.heart.org Summary  The DASH eating plan is a healthy eating plan that has been shown to reduce high blood pressure (hypertension). It may also reduce your risk for type 2 diabetes, heart disease, and stroke.  With the DASH eating plan, you should limit salt (sodium) intake to 2,300 mg a day. If you have hypertension, you may need to reduce your sodium intake to 1,500 mg a day.  When on the DASH eating plan, aim to eat more fresh fruits and vegetables, whole grains, lean proteins, low-fat dairy, and heart-healthy fats.  Work with your health care provider or diet and nutrition specialist (dietitian) to adjust your eating plan to your individual   calorie needs. This information is not intended to replace advice given to you by your health care provider. Make sure you discuss any questions you have with your health care provider. Document Released: 05/23/2011 Document Revised: 05/27/2016 Document Reviewed: 05/27/2016 Elsevier Interactive Patient Education  2018 Elsevier Inc.  

## 2018-05-12 ENCOUNTER — Telehealth: Payer: Self-pay | Admitting: *Deleted

## 2018-05-12 NOTE — Telephone Encounter (Signed)
Pt is going to sleep but is waking up, try 1/2 a xanax when wakes up, she says melatonin and benadryl does not help

## 2018-05-18 ENCOUNTER — Ambulatory Visit: Payer: BC Managed Care – PPO | Admitting: Family Medicine

## 2018-05-18 ENCOUNTER — Encounter: Payer: Self-pay | Admitting: Family Medicine

## 2018-05-18 ENCOUNTER — Telehealth: Payer: Self-pay | Admitting: Family Medicine

## 2018-05-18 VITALS — BP 138/84 | Temp 98.3°F | Ht 60.0 in | Wt 140.8 lb

## 2018-05-18 DIAGNOSIS — B9789 Other viral agents as the cause of diseases classified elsewhere: Secondary | ICD-10-CM

## 2018-05-18 DIAGNOSIS — I10 Essential (primary) hypertension: Secondary | ICD-10-CM

## 2018-05-18 DIAGNOSIS — J069 Acute upper respiratory infection, unspecified: Secondary | ICD-10-CM | POA: Diagnosis not present

## 2018-05-18 MED ORDER — HYDROCODONE-HOMATROPINE 5-1.5 MG/5ML PO SYRP: ORAL_SOLUTION | ORAL | 0 refills | Status: DC

## 2018-05-18 MED ORDER — PREDNISONE 20 MG PO TABS: 60.0000 mg | ORAL_TABLET | Freq: Every day | ORAL | 0 refills | Status: AC

## 2018-05-18 MED ORDER — ALBUTEROL SULFATE HFA 108 (90 BASE) MCG/ACT IN AERS: 1.0000 | INHALATION_SPRAY | Freq: Four times a day (QID) | RESPIRATORY_TRACT | 2 refills | Status: AC | PRN

## 2018-05-18 NOTE — Addendum Note (Signed)
Addended by: Sallee Lange A on: 05/18/2018 02:19 PM   Modules accepted: Orders

## 2018-05-18 NOTE — Telephone Encounter (Signed)
Hycodan prescription up front for pick up (surescripts is down) -Patient picked up prescription.

## 2018-05-18 NOTE — Progress Notes (Signed)
Subjective:    Patient ID: [name], female    DOB: Aug 24, 1980, 37 y.o.   MRN: [id number]  Chest Pain   This is a new problem. The current episode started in the past 7 days. The pain does not radiate (RIght in middle of chest). Associated symptoms include a cough, a fever, malaise/fatigue, sputum production and vomiting. Associated symptoms comments: Chills, sweats, vomiting a little due to cough, light-headness, when pt walks outside it is hard to breath but inside she breathes ok. . Treatments tried: inhaler. The treatment provided mild relief.    3 days ago started with dry cough. Taking alka selter cold/sinus and nyquil cough. Chest started hurting really bad last night. Pt was at work today and states she feels like something in sitting on her chest. Pt has had to use inhaler more than normal. Pt states it hurts to lay on left side more than the right. Pt states she has began to cough yellow/greenish phelgm.  Appetite has been decreased.   Alka seltzer sinus during the day and nyquil at night. Albuterol inhaler 2-3 times per day over the last 2-3 days, has been helpful. Increased activity causes increased shortness of breath. Chest tender to palpation. Fever initially on Saturday at 100.2, has been having chills and sweats but hasn't checked temp since. Reports today noticed progressively worsening mid-sternal chest pressure.   Was treated in October for sinusitis with doxycycline, pt reports those symptoms resolved.   Review of Systems  Constitutional: Positive for fever and malaise/fatigue.  Respiratory: Positive for cough and sputum production.   Cardiovascular: Positive for chest pain.  Gastrointestinal: Positive for vomiting.       Objective:   Physical Exam  Constitutional: She is oriented to person, place, and time. She appears well-developed and well-nourished. No distress.  HENT:  Head: Normocephalic and atraumatic.  Right Ear: Tympanic membrane normal.  Left  Ear: Tympanic membrane normal.  Nose: Nose normal. No sinus tenderness.  Mouth/Throat: Uvula is midline. Posterior oropharyngeal erythema (mild) present.  Eyes: Right eye exhibits no discharge. Left eye exhibits no discharge.  Neck: Neck supple.  Cardiovascular: Normal rate, regular rhythm and normal heart sounds.  No murmur heard. Pulmonary/Chest: Effort normal and breath sounds normal. No respiratory distress. She has no wheezes. She exhibits tenderness.  Lymphadenopathy:    Cervical adenopathy: tenderness.  Neurological: She is alert and oriented to person, place, and time.  Skin: Skin is warm and dry.  Psychiatric: She has a normal mood and affect.  Nursing note and vitals reviewed.     Assessment & Plan:  1. Viral URI with cough Discussed likely viral etiology. Recommended a chest-xray given her discomfort, but patient would like to hold off at this time. Feel this is reasonable given breath sounds were clear on exam. Will prescribe a short course of prednisone x 5 days, albuterol inhaler refilled. Discussed may take tylenol for chest discomfort but should avoid NSAIDS while on prednisone. Hycodan cough syrup rx for bedtime use only, pt reports taking this without allergic reaction previously. Instructed not to take with her xanax. Pt verbalized understanding. PMP database checked. Instructed to f/u in the next 2-4 days if symptoms are worsening and may need to obtain chest x-ray or treat with abx at that time.   2. Essential hypertension BP elevated at this visit and last visit. Pt with hx of high BP during pregnancies. Recommend treatment with lifestyle interventions at this time, discussed DASH diet and incorporating more exercise into  her schedule. Pt will work on these. F/u in about 3 months for her BP.  Dr. Sallee Lange was consulted on this case and is in agreement with the above treatment plan.

## 2018-05-18 NOTE — Telephone Encounter (Signed)
Prescription was called into [facility].  Patient notified.

## 2018-05-18 NOTE — Telephone Encounter (Signed)
Pt contacted [facility] and they are stating they do not have the medication that was called in today.

## 2018-05-18 NOTE — Telephone Encounter (Signed)
Patient called back stating meds still not called in to [facility]  I confirmed pharmacy and told her a note had already been sent back.

## 2018-06-15 ENCOUNTER — Other Ambulatory Visit: Payer: Self-pay | Admitting: Family Medicine

## 2018-06-15 ENCOUNTER — Telehealth: Payer: Self-pay | Admitting: Family Medicine

## 2018-06-15 NOTE — Telephone Encounter (Signed)
May have 1 refill 

## 2018-06-16 NOTE — Telephone Encounter (Signed)
May have this with 3 refills

## 2018-06-18 NOTE — Telephone Encounter (Signed)
error 

## 2018-07-03 ENCOUNTER — Telehealth: Payer: Self-pay

## 2018-07-03 ENCOUNTER — Other Ambulatory Visit: Payer: Self-pay | Admitting: Family Medicine

## 2018-07-03 ENCOUNTER — Other Ambulatory Visit: Payer: Self-pay

## 2018-07-03 MED ORDER — ALPRAZOLAM 0.5 MG PO TABS: ORAL_TABLET | ORAL | 2 refills | Status: DC

## 2018-07-03 NOTE — Telephone Encounter (Signed)
This +2 refills will need follow-up by [facility]

## 2018-07-03 NOTE — Telephone Encounter (Signed)
It would be fine to go ahead and do this as twice daily as needed #60 with 2 refills needs follow-up visit within 90 days

## 2018-07-03 NOTE — Telephone Encounter (Signed)
I called the Xanax 0.5 one bid prn # 30 with 2 refills to [place] at [facility] and he asked if you would mind calling in this medication for 30 day supplies as this pt is taking this two times per day faithfully as she is constantly calling q few weeks to ask him to send in refills. He says this would save a lot of her calling them for it Q few weeks. Please advise.May we fill # 60 with 2 refills?

## 2018-07-03 NOTE — Telephone Encounter (Signed)
Contacted [name] at [facility] and informed him that [facility] states it is OK to 30 day supply. Change epic to reflect this.

## 2018-07-09 ENCOUNTER — Encounter: Payer: Self-pay | Admitting: Family Medicine

## 2018-07-09 ENCOUNTER — Ambulatory Visit: Payer: BC Managed Care – PPO | Admitting: Family Medicine

## 2018-07-09 VITALS — BP 150/108 | Ht 60.0 in | Wt 146.0 lb

## 2018-07-09 DIAGNOSIS — J111 Influenza due to unidentified influenza virus with other respiratory manifestations: Secondary | ICD-10-CM

## 2018-07-09 DIAGNOSIS — Z13220 Encounter for screening for lipoid disorders: Secondary | ICD-10-CM | POA: Diagnosis not present

## 2018-07-09 DIAGNOSIS — I10 Essential (primary) hypertension: Secondary | ICD-10-CM

## 2018-07-09 MED ORDER — OSELTAMIVIR PHOSPHATE 75 MG PO CAPS: 75.0000 mg | ORAL_CAPSULE | Freq: Two times a day (BID) | ORAL | 0 refills | Status: AC

## 2018-07-09 MED ORDER — LISINOPRIL 5 MG PO TABS: 5.0000 mg | ORAL_TABLET | Freq: Every day | ORAL | 1 refills | Status: DC

## 2018-07-09 NOTE — Patient Instructions (Signed)

## 2018-07-09 NOTE — Progress Notes (Signed)
Subjective:    Patient ID: [name], female    DOB: August 24, 1980, 38 y.o.   MRN: [id number]  HPI  Patient is here today with complaints of diarrhea x 4 times today,non productive cough,runny nose, bilateral ear pain with the left hurting worse than the right.She also has a headache and sore throat, fever yesterday,but not today. All started yesterday.  Last night just didn't feel good. Woke up this morning not feeling well, as the day went on progressively got worse, started with body aches, fever of 101 yesterday.  She has been taking tylenol around the clock to feel better.  Bp elevated 150/108 and no decongestants taken today. Reports BP has been elevated at work also over the last couple weeks, states nurse checks it and typically 140s/100s.  Was on BP meds prior to her pregnancy.   Review of Systems  Constitutional: Positive for chills and fever.  HENT: Positive for congestion, ear pain, rhinorrhea and sore throat.   Respiratory: Positive for cough. Negative for shortness of breath and wheezing.   Cardiovascular: Negative for chest pain.  Gastrointestinal: Positive for diarrhea. Negative for blood in stool, nausea and vomiting.  Neurological: Positive for headaches.       Objective:   Physical Exam Vitals signs and nursing note reviewed.  Constitutional:      General: She is not in acute distress.    Appearance: She is not toxic-appearing.  HENT:     Head: Normocephalic and atraumatic.     Right Ear: Tympanic membrane normal.     Left Ear: Tympanic membrane normal.     Nose: Nose normal.     Mouth/Throat:     Mouth: Mucous membranes are moist.     Pharynx: Posterior oropharyngeal erythema (mild) present.  Eyes:     General:        Right eye: No discharge.        Left eye: No discharge.  Neck:     Musculoskeletal: Neck supple. No neck rigidity.  Cardiovascular:     Rate and Rhythm: Normal rate and regular rhythm.     Heart sounds: Normal heart sounds.    Pulmonary:     Effort: Pulmonary effort is normal. No respiratory distress.     Breath sounds: Normal breath sounds.  Lymphadenopathy:     Cervical: No cervical adenopathy.  Skin:    General: Skin is warm and dry.  Neurological:     Mental Status: She is alert and oriented to person, place, and time.  Psychiatric:        Mood and Affect: Mood normal.           Assessment & Plan:  1. Influenza Discussed likely flu diagnosis.  Will treat with Tamiflu given within the 48-hour window.  Symptomatic care discussed.  Warning signs discussed.  Follow-up if symptoms worsen or fail to improve.  2. Essential hypertension - Plan: Basic metabolic panel Discussed with patient blood pressures have been consistently elevated, including the blood pressure she has had done at work.  Recommend starting on medication at this time.  Will start on a low-dose lisinopril.  Recommend follow-up in 2 to 3 weeks.  She should continue checking her blood pressure and keeping a log of that at home.  Continue DASH diet and exercise.  Notify us if having any problems with medication.  Also check some basic lab work.  3. Lipid screening - Plan: Lipid panel  Dr. Sallee Lange was consulted on this case and  is in agreement with the above treatment plan.

## 2018-08-03 ENCOUNTER — Ambulatory Visit: Payer: BC Managed Care – PPO | Admitting: Family Medicine

## 2018-08-05 LAB — BASIC METABOLIC PANEL
BUN/Creatinine Ratio: 15 (ref 9–23)
BUN: 10 mg/dL (ref 6–20)
CO2: 24 mmol/L (ref 20–29)
Calcium: 9.7 mg/dL (ref 8.7–10.2)
Chloride: 101 mmol/L (ref 96–106)
Creatinine, Ser: 0.65 mg/dL (ref 0.57–1.00)
GFR calc Af Amer: 129 mL/min/{1.73_m2} (ref >59)
GFR calc non Af Amer: 112 mL/min/{1.73_m2} (ref >59)
Glucose: 90 mg/dL (ref 65–99)
Potassium: 4.5 mmol/L (ref 3.5–5.2)
Sodium: 140 mmol/L (ref 134–144)

## 2018-08-05 LAB — LIPID PANEL
Chol/HDL Ratio: 4.3 ratio (ref 0.0–4.4)
Cholesterol, Total: 223 mg/dL — ABNORMAL HIGH (ref 100–199)
HDL: 52 mg/dL (ref >39)
Triglycerides: 402 mg/dL — ABNORMAL HIGH (ref 0–149)

## 2018-08-07 ENCOUNTER — Other Ambulatory Visit: Payer: Self-pay | Admitting: Family Medicine

## 2018-08-07 DIAGNOSIS — E782 Mixed hyperlipidemia: Secondary | ICD-10-CM

## 2018-09-14 ENCOUNTER — Other Ambulatory Visit: Payer: Self-pay | Admitting: Family Medicine

## 2018-09-18 ENCOUNTER — Telehealth: Payer: Self-pay | Admitting: Family Medicine

## 2018-09-18 MED ORDER — METHOCARBAMOL 500 MG PO TABS: 500.0000 mg | ORAL_TABLET | Freq: Three times a day (TID) | ORAL | 0 refills | Status: DC | PRN

## 2018-09-18 NOTE — Telephone Encounter (Signed)
Nurses first talk with the patient regarding that issue make sure there is not anything else going on  so typically what is done for this is the following -May use OTC anti-inflammatory such as Advil or Aleve-store brand on a daily basis for the next 5 to 7 days -Warm compresses gentle massage -If necessary can have a muscle relaxer for evening use-Robaxin 500 mg 1 every 8 hours as needed muscle discomfort, best to use only over the next few days, home use only caution drowsiness, #20 no refills If ongoing troubles I recommend a video visit I will be out the rest of the day

## 2018-09-18 NOTE — Telephone Encounter (Signed)
Patient advised per Dr Nicki Reaper: -May use OTC anti-inflammatory such as Advil or Aleve-store brand on a daily basis for the next 5 to 7 days -Warm compresses gentle massage -If necessary can have a muscle relaxer for evening use-Robaxin 500 mg 1 every 8 hours as needed muscle discomfort, best to use only over the next few days, home use only caution drowsiness, #20 no refills Patient verbalized understanding. Prescription sent electronically to pharmacy.

## 2018-09-18 NOTE — Telephone Encounter (Signed)
Pt is complaining if right upper back at shoulder blade is hurting when takes a deep breath. She picked her daughter up and turned and that's when the pain started. It is not causing her problems when sleeping, no cough or fever. She thinks it is muscle related and wants to know what she should do.   [place], [place]  CB# [phone].

## 2018-09-29 ENCOUNTER — Other Ambulatory Visit: Payer: Self-pay | Admitting: Family Medicine

## 2018-09-30 ENCOUNTER — Other Ambulatory Visit: Payer: Self-pay

## 2018-09-30 ENCOUNTER — Encounter: Payer: Self-pay | Admitting: Family Medicine

## 2018-09-30 ENCOUNTER — Other Ambulatory Visit: Payer: Self-pay | Admitting: Family Medicine

## 2018-09-30 ENCOUNTER — Ambulatory Visit (INDEPENDENT_AMBULATORY_CARE_PROVIDER_SITE_OTHER): Payer: BC Managed Care – PPO | Admitting: Family Medicine

## 2018-09-30 DIAGNOSIS — I10 Essential (primary) hypertension: Secondary | ICD-10-CM | POA: Diagnosis not present

## 2018-09-30 DIAGNOSIS — F411 Generalized anxiety disorder: Secondary | ICD-10-CM

## 2018-09-30 MED ORDER — ALPRAZOLAM 0.5 MG PO TABS: ORAL_TABLET | ORAL | 5 refills | Status: DC

## 2018-09-30 MED ORDER — LISINOPRIL 5 MG PO TABS: ORAL_TABLET | ORAL | 1 refills | Status: DC

## 2018-09-30 NOTE — Telephone Encounter (Signed)
Needs to schedule a virtual visit Once this is scheduled may have a refill on the medicine Has not been seen regarding this medication for well past 6 months

## 2018-09-30 NOTE — Progress Notes (Signed)
   Subjective:    Patient ID: [name], female    DOB: [birth year], 38 y.o.   MRN: [id number] Video and audio were capable Coronavirus outbreak  HPI Pt needing refill on Xanax. Pt needed an office visit in order to get refills. Pt states she is trying to walk and lose weight. Pt states no other problems at this time.  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  States she is taking her medicine and is requesting refills.  Denies any complications with other health issues.  Trying to eat healthy try to get proper mental sleep  She does take a blood pressure medicine regular basis avoid salt intake takes her thyroid medicine on a regular basis states energy level blood pressures been doing well Virtual Visit via Video Note  I connected with [name] on 09/30/18 at  1:10 PM EDT by a video enabled telemedicine application and verified that I am speaking with the correct person using two identifiers.   I discussed the limitations of evaluation and management by telemedicine and the availability of in person appointments. The patient expressed understanding and agreed to proceed.  History of Present Illness:    Observations/Objective:   Assessment and Plan:   Follow Up Instructions:    I discussed the assessment and treatment plan with the patient. The patient was provided an opportunity to ask questions and all were answered. The patient agreed with the plan and demonstrated an understanding of the instructions.   The patient was advised to call back or seek an in-person evaluation if the symptoms worsen or if the condition fails to improve as anticipated.  I provided 15 minutes of non-face-to-face time during this encounter.   Vicente Males,  LPN    Review of Systems     Objective:   Physical Exam        Assessment & Plan:  Generalized anxiety disorder uses Xanax intermittently to help with anxiety as well as help with rest she denies being depressed does not abuse her medicine drug registry checked a refill sent in  Blood pressure medicine does good job taking this will continue taking it watches salt diet monitor blood pressure  Follow-up in 6 months no lab work indicated currently

## 2018-09-30 NOTE — Telephone Encounter (Signed)
Pt has scheduled visit for this afternoon. How many refill? Please advise. Thank you

## 2018-10-01 NOTE — Telephone Encounter (Signed)
I sent in a prescription with 5 refills

## 2018-12-28 ENCOUNTER — Other Ambulatory Visit: Payer: BC Managed Care – PPO

## 2018-12-28 ENCOUNTER — Other Ambulatory Visit: Payer: Self-pay

## 2018-12-28 DIAGNOSIS — Z20822 Contact with and (suspected) exposure to covid-19: Secondary | ICD-10-CM

## 2019-01-01 LAB — NOVEL CORONAVIRUS, NAA: SARS-CoV-2, NAA: NOT DETECTED

## 2019-02-13 ENCOUNTER — Emergency Department (HOSPITAL_COMMUNITY)
Admission: EM | Admit: 2019-02-13 | Discharge: 2019-02-13 | Disposition: A | Discharge status: Home / Self Care | Payer: BC Managed Care – PPO | Attending: Emergency Medicine | Admitting: Emergency Medicine

## 2019-02-13 ENCOUNTER — Other Ambulatory Visit: Payer: Self-pay

## 2019-02-13 ENCOUNTER — Encounter (HOSPITAL_COMMUNITY): Payer: Self-pay | Admitting: Emergency Medicine

## 2019-02-13 ENCOUNTER — Emergency Department (HOSPITAL_COMMUNITY): Payer: BC Managed Care – PPO

## 2019-02-13 DIAGNOSIS — S99911A Unspecified injury of right ankle, initial encounter: Secondary | ICD-10-CM | POA: Diagnosis present

## 2019-02-13 DIAGNOSIS — Y939 Activity, unspecified: Secondary | ICD-10-CM | POA: Insufficient documentation

## 2019-02-13 DIAGNOSIS — J45909 Unspecified asthma, uncomplicated: Secondary | ICD-10-CM | POA: Insufficient documentation

## 2019-02-13 DIAGNOSIS — E039 Hypothyroidism, unspecified: Secondary | ICD-10-CM | POA: Insufficient documentation

## 2019-02-13 DIAGNOSIS — Y92009 Unspecified place in unspecified non-institutional (private) residence as the place of occurrence of the external cause: Secondary | ICD-10-CM | POA: Insufficient documentation

## 2019-02-13 DIAGNOSIS — Y999 Unspecified external cause status: Secondary | ICD-10-CM | POA: Insufficient documentation

## 2019-02-13 DIAGNOSIS — I10 Essential (primary) hypertension: Secondary | ICD-10-CM | POA: Diagnosis not present

## 2019-02-13 DIAGNOSIS — W109XXA Fall (on) (from) unspecified stairs and steps, initial encounter: Secondary | ICD-10-CM | POA: Insufficient documentation

## 2019-02-13 DIAGNOSIS — Z79899 Other long term (current) drug therapy: Secondary | ICD-10-CM | POA: Insufficient documentation

## 2019-02-13 DIAGNOSIS — S82844A Nondisplaced bimalleolar fracture of right lower leg, initial encounter for closed fracture: Secondary | ICD-10-CM | POA: Diagnosis not present

## 2019-02-13 DIAGNOSIS — S82841A Displaced bimalleolar fracture of right lower leg, initial encounter for closed fracture: Secondary | ICD-10-CM

## 2019-02-13 MED ORDER — KETOROLAC TROMETHAMINE 60 MG/2ML IM SOLN
60.0000 mg | Freq: Once | INTRAMUSCULAR | Status: AC
  Administered 2019-02-13: 06:00:00 60 mg via INTRAMUSCULAR
  Filled 2019-02-13: qty 2

## 2019-02-13 MED ORDER — OXYCODONE-ACETAMINOPHEN 5-325 MG PO TABS
2.0000 | ORAL_TABLET | Freq: Once | ORAL | Status: AC
  Administered 2019-02-13: 2 via ORAL
  Filled 2019-02-13: qty 2

## 2019-02-13 MED ORDER — MORPHINE SULFATE (PF) 4 MG/ML IV SOLN
4.0000 mg | Freq: Once | INTRAVENOUS | Status: AC
  Administered 2019-02-13: 4 mg via INTRAMUSCULAR
  Filled 2019-02-13: qty 1

## 2019-02-13 MED ORDER — PERCOCET 5-325 MG PO TABS: 1.0000 | ORAL_TABLET | Freq: Four times a day (QID) | ORAL | 0 refills | Status: DC | PRN

## 2019-02-13 NOTE — Discharge Instructions (Signed)
Use your crutches, do not put any weight on that foot.  Leave the splint in place until you see Dr. Aline Brochure.  If you start having pain in your toes or your toes get pale and cold you should return immediately to the emergency department.  Please call Dr. Ruthe Mannan office on Monday, August 31 to get an appointment to discuss your surgery.  Take the Percocet for pain, you can also take ibuprofen 600 mg 4 times a day OR Aleve 2 tablets twice a day for pain.  Keep your foot elevated.  Try to keep ice packs on it to prevent swelling.

## 2019-02-13 NOTE — Progress Notes (Signed)
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

## 2019-02-13 NOTE — ED Triage Notes (Signed)
Pt fell down stairs after tripping over her cat injuring L. Ankle. Pt with abrasion to R. Ankle and obvious swelling to R ankle.

## 2019-02-13 NOTE — ED Notes (Signed)
Pt's right ankle is visibly swollen upon assessment with minimal movement or weight-bearing capability. Pt also states her right hip is hurting a little following a fall down stairs at home after tripping over her pet cat.

## 2019-02-13 NOTE — ED Provider Notes (Signed)
[facility] EMERGENCY DEPARTMENT Provider Note   CSN: [id number] Arrival date & time: 02/13/19  0105   Time seen 3:28 AM  History   Chief Complaint Chief Complaint  Patient presents with  . Ankle Pain    HPI DEESHA Olson is a 38 y.o. female.     HPI patient states she walked down about 6 steps in the dark and she tripped over the cat and fell.  She was wearing moccasin type slippers.  She states after she fell her foot was angulated the wrong way and she popped it back.  She states she did have some right hip pain however that is gone now.  She denies any knee pain.  She denies hitting her head or having any loss of consciousness.  PCP Kathyrn Drown, MD   Past Medical History:  Diagnosis Date  . AMA (advanced maternal age) multigravida 35+ 05/31/2014  . Anxiety 02/01/2015  . Asthma   . Fibroids 08/14/2015  . History of kidney stones   . Hypertension    history of   . Hypothyroid 11/04/2017  . Klippel-Feil deformity    syrinx  . Menorrhagia with regular cycle 07/26/2015  . PONV (postoperative nausea and vomiting)   . Pregnant 05/31/2014  . Sleep disturbance 07/26/2015  . Spotting 05/31/2014    Patient Active Problem List   Diagnosis Date Noted  . Hypothyroid 11/04/2017  . 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 Postoperative abdominal pain 02/18/2017  . Generalized anxiety disorder 12/29/2016  . 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  . Fibroids 08/14/2015  . Menorrhagia with regular cycle 07/26/2015  . Sleep disturbance 07/26/2015  . 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 Anxiety 02/01/2015  . Breech presentation, single footling 12/27/2014  . Hypertension affecting pregnancy, antepartum 12/27/2014  . Gestational diabetes mellitus, antepartum 12/27/2014  . S/P cesarean section 12/27/2014  . 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 Short cervix affecting pregnancy 11/04/2014  . Hx of preterm delivery, currently  pregnant 11/04/2014  . ASCUS pap  07/04/2014  . Supervision of high-risk pregnancy 06/29/2014  . Uterine fibroid during pregnancy, antepartum 06/29/2014  . Previous preterm delivery, antepartum 06/29/2014  . AMA (advanced maternal age) multigravida 35+ 05/31/2014  . Asthma   . Hypertension     Past Surgical History:  Procedure Laterality Date  . CESAREAN SECTION N/A 12/27/2014   Procedure: CESAREAN SECTION;  Surgeon: Osborne Oman, MD;  Location: [facility];  Service: Obstetrics;  Laterality: N/A;  . CHOLECYSTECTOMY    . DILATION AND CURETTAGE OF UTERUS    . HYSTEROSCOPY N/A 01/31/2017   Procedure: HYSTEROSCOPY WITH HYDROTHERMAL ABLATION;  Surgeon: Jonnie Kind, MD;  Location: [facility];  Service: Gynecology;  Laterality: N/A;  . LAPAROSCOPIC BILATERAL SALPINGECTOMY Bilateral 01/31/2017   Procedure: LAPAROSCOPIC BILATERAL SALPINGECTOMY WITH REMOVAL OF RIGHT OPARATUBAL CYST;  Surgeon: Jonnie Kind, MD;  Location: [facility];  Service: Gynecology;  Laterality: Bilateral;  . LITHOTRIPSY       OB History    Gravida  3   Para  2   Term  1   Preterm  1   AB  1   Living  2     SAB  1   TAB      Ectopic      Multiple  0   Live Births  2            Home Medications    Prior to Admission medications   Medication Sig Start  Date End Date Taking? Authorizing Provider  albuterol (PROVENTIL HFA;VENTOLIN HFA) 108 (90 Base) MCG/ACT inhaler Inhale 1-2 puffs into the lungs every 6 (six) hours as needed for wheezing or shortness of breath. Patient not taking: Reported on 09/30/2018 05/18/18   Cheyenne Adas, NP  ALPRAZolam Duanne Moron) 0.5 MG tablet Take one tablet twice daily as needed 09/30/18   Kathyrn Drown, MD  levothyroxine (SYNTHROID, LEVOTHROID) 50 MCG tablet Take 1 tablet (50 mcg total) by mouth daily before breakfast. 04/06/18   Estill Dooms, NP  lisinopril (PRINIVIL,ZESTRIL) 5 MG tablet TAKE ONE TABLET (5MG  TOTAL) BY MOUTH DAILY 09/30/18   Kathyrn Drown, MD   Norethindrone-Ethinyl Estradiol-Fe Biphas (LO LOESTRIN FE) 1 MG-10 MCG / 10 MCG tablet Take 1 tablet by mouth daily. Take 1 daily by mouth Patient not taking: Reported on 05/18/2018 11/03/17   Estill Dooms, NP  PERCOCET 5-325 MG tablet Take 1-2 tablets by mouth every 6 (six) hours as needed for moderate pain or severe pain. 02/13/19   Rolland Porter, MD    Family History Family History  Problem Relation Age of Onset  . Diabetes Paternal Grandfather   . Cancer Paternal Grandfather        lung  . Diabetes Paternal Grandmother   . Diabetes Maternal Grandmother   . Diabetes Maternal Grandfather   . Hypothyroidism Mother     Social History Social History   Tobacco Use  . Smoking status: Never Smoker  . Smokeless tobacco: Never Used  Substance Use Topics  . Alcohol use: Yes    Comment: glass of wine occ  . 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    Physical Exam Updated Vital Signs Ht 4\' 11"  (1.499 m)   Wt 63.5 kg   LMP 01/16/2019   BMI 28.28 kg/m   Physical Exam Vitals signs and nursing note reviewed.  Constitutional:      Appearance: Normal appearance.  HENT:     Head: Normocephalic and atraumatic.     Right Ear: External ear normal.     Left Ear: External ear normal.  Eyes:     Extraocular Movements: Extraocular movements intact.     Conjunctiva/sclera: Conjunctivae normal.  Neck:     Musculoskeletal: Normal range of motion.  Cardiovascular:     Rate and Rhythm: Normal rate.  Pulmonary:     Effort: Pulmonary effort is normal. No respiratory distress.  Musculoskeletal:        General: Swelling present.     Comments: Patient has diffuse swelling and some bruising around her left ankle.  She has a superficial  abrasion on the medial aspect of her ankle, please see photo.  She has good distal pulses.  She is nontender in her knee.  Skin:    General: Skin is warm and dry.     Findings: No erythema.  Neurological:     General: No focal deficit present.     Mental Status: She is alert and oriented to person, place, and time.     Cranial Nerves: No cranial nerve deficit.  Psychiatric:        Mood and Affect: Mood normal.        Behavior: Behavior normal.        Thought Content: Thought content normal.  ED Treatments / Results  Labs (all labs ordered are listed, but only abnormal results are displayed) Labs Reviewed - No data to display  EKG None  Radiology Dg Ankle Complete Right  Result Date: 02/13/2019 CLINICAL DATA:  38 year old female with fall and right ankle pain. EXAM: RIGHT ANKLE - COMPLETE 3+ VIEW COMPARISON:  None. FINDINGS: There is a mildly displaced, possibly comminuted oblique fracture of the distal fibula with approximately 3 mm lateral displacement of the distal fracture fragment. There is a displaced fracture of the medial malleolus with approximately 4 mm lateral displacement of the distal fracture fragment. There is approximately 5 mm lateral subluxation of the ankle mortise. There is diffuse soft tissue swelling of the ankle. IMPRESSION: Bimalleolar fracture with 5 mm lateral subluxation of the ankle mortise. Electronically Signed   By: Anner Crete M.D.   On: 02/13/2019 03:09    Procedures Procedures (including critical care time)  Medications Ordered in ED Medications  morphine 4 MG/ML injection 4 mg (4 mg Intramuscular Given 02/13/19 0415)  ketorolac (TORADOL) injection 60 mg (60 mg Intramuscular Given 02/13/19 0541)  oxyCODONE-acetaminophen (PERCOCET/ROXICET) 5-325 MG per tablet 2 tablet (2 tablets Oral Given 02/13/19 0541)     Initial Impression / Assessment and Plan / ED Course  I have reviewed the triage vital signs and the nursing notes.  Pertinent  labs & imaging results that were available during my care of the patient were reviewed by me and considered in my medical decision making (see chart for details).    Patient presents after a fall at home and it sounds like she had a fracture dislocation and she reduced her dislocation herself.  She was placed in a posterior/stirrup splint.  I ordered crutches, however she states she already has some.  She was given morphine IM because it would last longer than IV.  5:05 AM patient was discussed with Dr. Aline Brochure.  He states he already has some emergency surgeries to do this weekend and he would prefer to follow-up with her in the office.  Recheck after talking to Dr. Aline Brochure, patient states she still having a lot of pain.  She was given Toradol IM and 2 oral Percocet.  Final Clinical Impressions(s) / ED Diagnoses   Final diagnoses:  Closed bimalleolar fracture of right ankle, initial encounter    ED Discharge Orders         Ordered    PERCOCET 5-325 MG tablet  Every 6 hours PRN     02/13/19 0550        OTC ibuprofen or Aleve  Plan discharge  Rolland Porter, MD, Barbette Or, MD 02/13/19 [phone]

## 2019-02-13 NOTE — ED Notes (Signed)
Pt refused crutches stating "I have some already at home". MD Notified.

## 2019-02-13 NOTE — Progress Notes (Signed)
CLINICAL DATA: 38 year old female with fall and right ankle pain. EXAM: RIGHT ANKLE - COMPLETE 3+ VIEW COMPARISON: None. FINDINGS: There is a mildly displaced, possibly comminuted oblique fracture of the distal fibula with approximately 3 mm lateral displacement of the distal fracture fragment. There is a displaced fracture of the medial malleolus with approximately 4 mm lateral displacement of the distal fracture fragment. There is approximately 5 mm lateral subluxation of the ankle mortise. There is diffuse soft tissue swelling of the ankle. IMPRESSION: Bimalleolar fracture with 5 mm lateral subluxation of the ankle mortise. Electronically Signed By: Anner Crete M.D. On: 02/13/2019 03:09

## 2019-02-14 ENCOUNTER — Other Ambulatory Visit: Payer: Self-pay | Admitting: Orthopedic Surgery

## 2019-02-14 DIAGNOSIS — Z01818 Encounter for other preprocedural examination: Secondary | ICD-10-CM

## 2019-02-15 ENCOUNTER — Other Ambulatory Visit: Payer: Self-pay

## 2019-02-15 ENCOUNTER — Other Ambulatory Visit (HOSPITAL_COMMUNITY)
Admission: RE | Admit: 2019-02-15 | Discharge: 2019-02-15 | Disposition: A | Discharge status: Home / Self Care | Payer: BC Managed Care – PPO | Source: Ambulatory Visit | Attending: Orthopedic Surgery | Admitting: Orthopedic Surgery

## 2019-02-15 ENCOUNTER — Other Ambulatory Visit: Payer: Self-pay | Admitting: Orthopedic Surgery

## 2019-02-15 ENCOUNTER — Encounter (HOSPITAL_COMMUNITY): Payer: Self-pay

## 2019-02-15 DIAGNOSIS — S82841A Displaced bimalleolar fracture of right lower leg, initial encounter for closed fracture: Secondary | ICD-10-CM | POA: Insufficient documentation

## 2019-02-15 DIAGNOSIS — Z01812 Encounter for preprocedural laboratory examination: Secondary | ICD-10-CM | POA: Insufficient documentation

## 2019-02-15 DIAGNOSIS — Z20828 Contact with and (suspected) exposure to other viral communicable diseases: Secondary | ICD-10-CM | POA: Insufficient documentation

## 2019-02-15 LAB — SARS CORONAVIRUS 2 (TAT 6-24 HRS): SARS Coronavirus 2: NEGATIVE

## 2019-02-15 NOTE — H&P (Signed)
[name] is an 38 y.o. female.   Chief Complaint: Right ankle pain HPI: 38 year old female injured her ankle on August 29 as she tripped on the stairs negotiating a cat that was lying there.  She popped her ankle back in place came to the emergency room x-rays showed a bimalleolar fracture.  Due to the COVID-19 situation we advised her to go directly to the hospital for admission and surgery.  I spoke to her on the phone she has acute onset of right ankle pain now above 2 days duration with medial lateral pain which initially was severe improved after splinting associated decreased range of motion and painful attempts at weightbearing  Past Medical History:  Diagnosis Date  . AMA (advanced maternal age) multigravida 35+ 05/31/2014  . Anxiety 02/01/2015  . Asthma   . Fibroids 08/14/2015  . History of kidney stones   . Hypertension    history of   . Hypothyroid 11/04/2017  . Klippel-Feil deformity    syrinx  . Menorrhagia with regular cycle 07/26/2015  . PONV (postoperative nausea and vomiting)   . Pregnant 05/31/2014  . Sleep disturbance 07/26/2015  . Spotting 05/31/2014    Past Surgical History:  Procedure Laterality Date  . CESAREAN SECTION N/A 12/27/2014   Procedure: CESAREAN SECTION;  Surgeon: Osborne Oman, MD;  Location: [facility];  Service: Obstetrics;  Laterality: N/A;  . CHOLECYSTECTOMY    . DILATION AND CURETTAGE OF UTERUS    . HYSTEROSCOPY N/A 01/31/2017   Procedure: HYSTEROSCOPY WITH HYDROTHERMAL ABLATION;  Surgeon: Jonnie Kind, MD;  Location: [facility];  Service: Gynecology;  Laterality: N/A;  . LAPAROSCOPIC BILATERAL SALPINGECTOMY Bilateral 01/31/2017   Procedure: LAPAROSCOPIC BILATERAL SALPINGECTOMY WITH REMOVAL OF RIGHT OPARATUBAL CYST;  Surgeon: Jonnie Kind, MD;  Location: [facility];  Service: Gynecology;  Laterality: Bilateral;  . LITHOTRIPSY      Family History  Problem Relation Age of Onset  . Diabetes Paternal Grandfather   . Cancer Paternal  Grandfather        lung  . Diabetes Paternal Grandmother   . Diabetes Maternal Grandmother   . Diabetes Maternal Grandfather   . Hypothyroidism Mother    Social History:  reports that she has never smoked. She has never used smokeless tobacco. She reports current alcohol use. She reports that she does not use drugs.  Allergies:  Allergies  Allergen Reactions  . Biaxin [Clarithromycin] Hives  . Cefzil [Cefprozil] Nausea Only  . Ciprofloxacin Hives  . Hydrocodone Other (See Comments)    Causes patient to feel wired  . Morphine And Related   . Sulfa Antibiotics Hives    No medications prior to admission.    No results found for this or any previous visit (from the past 48 hour(s)). No results found.  Review of Systems  Respiratory: Negative.   Cardiovascular: Negative.   Neurological: Negative for sensory change.  All other systems reviewed and are negative.   Last menstrual period 01/16/2019. Physical Exam  Constitutional: She is oriented to person, place, and time. She appears well-developed and well-nourished. No distress.  HENT:  Head: Normocephalic and atraumatic.  Eyes: Pupils are equal, round, and reactive to light. Conjunctivae and EOM are normal. Right eye exhibits no discharge. Left eye exhibits no discharge. No scleral icterus.  Neck: Normal range of motion. Neck supple. No JVD present. No tracheal deviation present. No thyromegaly present.  Cardiovascular: Normal rate, regular rhythm and normal heart sounds.  Respiratory: Effort normal and breath sounds normal.  No stridor. No respiratory distress. She has no wheezes. She has no rales. She exhibits no tenderness.  GI: Soft. Bowel sounds are normal. She exhibits no distension.  Musculoskeletal:     Comments: Upper extremities normal alignment and rom and joint stability, and motor function  Neurovascular intact skin normal   Left ankle tender swollen normal rom skin normal   Right ankle in splint      Lymphadenopathy:    She has cervical adenopathy.  Neurological: She is alert and oriented to person, place, and time. She has normal reflexes. She displays normal reflexes. No cranial nerve deficit. She exhibits normal muscle tone. Coordination normal.  Skin: Skin is warm and dry. No rash noted. She is not diaphoretic. No erythema. No pallor.  Psychiatric: She has a normal mood and affect. Her behavior is normal. Judgment and thought content normal.     Assessment/Plan Right ankle bimalleolar fracture displaced  Open reduction internal fixation right ankle  The procedure has been fully reviewed with the patient; The risks and benefits of surgery have been discussed and explained and understood. Alternative treatment has also been reviewed, questions were encouraged and answered. The postoperative plan is also been reviewed.   Arther Abbott, MD 02/15/2019, 3:42 PM

## 2019-02-16 ENCOUNTER — Ambulatory Visit (HOSPITAL_COMMUNITY): Payer: BC Managed Care – PPO

## 2019-02-16 ENCOUNTER — Ambulatory Visit (HOSPITAL_COMMUNITY): Payer: BC Managed Care – PPO | Admitting: Anesthesiology

## 2019-02-16 ENCOUNTER — Other Ambulatory Visit: Payer: Self-pay

## 2019-02-16 ENCOUNTER — Ambulatory Visit (HOSPITAL_COMMUNITY)
Admission: RE | Admit: 2019-02-16 | Discharge: 2019-02-16 | Disposition: A | Discharge status: Home / Self Care | Payer: BC Managed Care – PPO | Attending: Orthopedic Surgery | Admitting: Orthopedic Surgery

## 2019-02-16 ENCOUNTER — Encounter (HOSPITAL_COMMUNITY): Payer: Self-pay

## 2019-02-16 ENCOUNTER — Encounter (HOSPITAL_COMMUNITY)
Admission: RE | Disposition: A | Discharge status: Home / Self Care | Payer: Self-pay | Source: Home / Self Care | Attending: Orthopedic Surgery

## 2019-02-16 ENCOUNTER — Telehealth: Payer: Self-pay

## 2019-02-16 DIAGNOSIS — Z833 Family history of diabetes mellitus: Secondary | ICD-10-CM | POA: Insufficient documentation

## 2019-02-16 DIAGNOSIS — J45909 Unspecified asthma, uncomplicated: Secondary | ICD-10-CM | POA: Insufficient documentation

## 2019-02-16 DIAGNOSIS — Z882 Allergy status to sulfonamides status: Secondary | ICD-10-CM | POA: Insufficient documentation

## 2019-02-16 DIAGNOSIS — Q761 Klippel-Feil syndrome: Secondary | ICD-10-CM | POA: Insufficient documentation

## 2019-02-16 DIAGNOSIS — S82841A Displaced bimalleolar fracture of right lower leg, initial encounter for closed fracture: Secondary | ICD-10-CM | POA: Diagnosis not present

## 2019-02-16 DIAGNOSIS — Z87442 Personal history of urinary calculi: Secondary | ICD-10-CM | POA: Insufficient documentation

## 2019-02-16 DIAGNOSIS — S82891A Other fracture of right lower leg, initial encounter for closed fracture: Secondary | ICD-10-CM | POA: Diagnosis not present

## 2019-02-16 DIAGNOSIS — W108XXA Fall (on) (from) other stairs and steps, initial encounter: Secondary | ICD-10-CM | POA: Diagnosis not present

## 2019-02-16 DIAGNOSIS — Z881 Allergy status to other antibiotic agents status: Secondary | ICD-10-CM | POA: Diagnosis not present

## 2019-02-16 DIAGNOSIS — Z801 Family history of malignant neoplasm of trachea, bronchus and lung: Secondary | ICD-10-CM | POA: Diagnosis not present

## 2019-02-16 DIAGNOSIS — Z9049 Acquired absence of other specified parts of digestive tract: Secondary | ICD-10-CM | POA: Diagnosis not present

## 2019-02-16 DIAGNOSIS — F419 Anxiety disorder, unspecified: Secondary | ICD-10-CM | POA: Insufficient documentation

## 2019-02-16 DIAGNOSIS — E039 Hypothyroidism, unspecified: Secondary | ICD-10-CM | POA: Insufficient documentation

## 2019-02-16 DIAGNOSIS — F329 Major depressive disorder, single episode, unspecified: Secondary | ICD-10-CM | POA: Diagnosis not present

## 2019-02-16 DIAGNOSIS — Z8349 Family history of other endocrine, nutritional and metabolic diseases: Secondary | ICD-10-CM | POA: Diagnosis not present

## 2019-02-16 DIAGNOSIS — G479 Sleep disorder, unspecified: Secondary | ICD-10-CM | POA: Diagnosis not present

## 2019-02-16 DIAGNOSIS — Z888 Allergy status to other drugs, medicaments and biological substances status: Secondary | ICD-10-CM | POA: Diagnosis not present

## 2019-02-16 DIAGNOSIS — Z885 Allergy status to narcotic agent status: Secondary | ICD-10-CM | POA: Insufficient documentation

## 2019-02-16 DIAGNOSIS — I10 Essential (primary) hypertension: Secondary | ICD-10-CM | POA: Insufficient documentation

## 2019-02-16 HISTORY — PX: CAST APPLICATION: SHX380

## 2019-02-16 HISTORY — PX: ORIF ANKLE FRACTURE: SHX5408

## 2019-02-16 LAB — CBC
HCT: 37.8 % (ref 36.0–46.0)
Hemoglobin: 12 g/dL (ref 12.0–15.0)
MCH: 30.8 pg (ref 26.0–34.0)
MCHC: 31.7 g/dL (ref 30.0–36.0)
MCV: 97.2 fL (ref 80.0–100.0)
Platelets: 360 10*3/uL (ref 150–400)
RBC: 3.89 MIL/uL (ref 3.87–5.11)
RDW: 12.7 % (ref 11.5–15.5)
WBC: 7 10*3/uL (ref 4.0–10.5)
nRBC: 0 % (ref 0.0–0.2)

## 2019-02-16 LAB — HCG, QUANTITATIVE, PREGNANCY: hCG, Beta Chain, Quant, S: <1 m[IU]/mL (ref <5)

## 2019-02-16 SURGERY — OPEN REDUCTION INTERNAL FIXATION (ORIF) ANKLE FRACTURE
Anesthesia: General | Site: Ankle | Laterality: Right

## 2019-02-16 MED ORDER — PROMETHAZINE HCL 12.5 MG PO TABS: 12.5000 mg | ORAL_TABLET | Freq: Four times a day (QID) | ORAL | 0 refills | Status: DC | PRN

## 2019-02-16 MED ORDER — HYDROCODONE-ACETAMINOPHEN 10-325 MG PO TABS: 1.0000 | ORAL_TABLET | Freq: Once | ORAL | Status: DC

## 2019-02-16 MED ORDER — ONDANSETRON HCL 4 MG/2ML IJ SOLN
INTRAMUSCULAR | Status: DC | PRN
  Administered 2019-02-16: 4 mg via INTRAVENOUS

## 2019-02-16 MED ORDER — HYDROMORPHONE HCL 1 MG/ML IJ SOLN
0.5000 mg | INTRAMUSCULAR | Status: DC | PRN
  Administered 2019-02-16 (×2): 0.5 mg via INTRAVENOUS

## 2019-02-16 MED ORDER — PROMETHAZINE HCL 25 MG/ML IJ SOLN: 6.2500 mg | INTRAMUSCULAR | Status: DC | PRN

## 2019-02-16 MED ORDER — IBUPROFEN 800 MG PO TABS
800.0000 mg | ORAL_TABLET | Freq: Once | ORAL | Status: AC
  Administered 2019-02-16: 800 mg via ORAL

## 2019-02-16 MED ORDER — ENSURE PRE-SURGERY PO LIQD
296.0000 mL | Freq: Once | ORAL | Status: DC
  Filled 2019-02-16: qty 296

## 2019-02-16 MED ORDER — IBUPROFEN 800 MG PO TABS: 800.0000 mg | ORAL_TABLET | Freq: Three times a day (TID) | ORAL | 0 refills | Status: DC | PRN

## 2019-02-16 MED ORDER — FENTANYL CITRATE (PF) 250 MCG/5ML IJ SOLN
INTRAMUSCULAR | Status: AC
  Filled 2019-02-16: qty 5

## 2019-02-16 MED ORDER — ONDANSETRON HCL 4 MG/2ML IJ SOLN
4.0000 mg | Freq: Once | INTRAMUSCULAR | Status: AC
  Administered 2019-02-16: 13:00:00 4 mg via INTRAVENOUS

## 2019-02-16 MED ORDER — ROCURONIUM 10MG/ML (10ML) SYRINGE FOR MEDFUSION PUMP - OPTIME
INTRAVENOUS | Status: DC | PRN
  Administered 2019-02-16: 40 mg via INTRAVENOUS

## 2019-02-16 MED ORDER — LABETALOL HCL 5 MG/ML IV SOLN
INTRAVENOUS | Status: DC | PRN
  Administered 2019-02-16 (×2): 5 mg via INTRAVENOUS

## 2019-02-16 MED ORDER — ONDANSETRON HCL 4 MG/2ML IJ SOLN
INTRAMUSCULAR | Status: AC
  Filled 2019-02-16: qty 2

## 2019-02-16 MED ORDER — BUPIVACAINE-EPINEPHRINE (PF) 0.5% -1:200000 IJ SOLN
INTRAMUSCULAR | Status: AC
  Filled 2019-02-16: qty 60

## 2019-02-16 MED ORDER — PROPOFOL 10 MG/ML IV BOLUS
INTRAVENOUS | Status: AC
  Filled 2019-02-16: qty 20

## 2019-02-16 MED ORDER — CHLORHEXIDINE GLUCONATE 4 % EX LIQD: 60.0000 mL | Freq: Once | CUTANEOUS | Status: DC

## 2019-02-16 MED ORDER — OXYCODONE-ACETAMINOPHEN 5-325 MG PO TABS: 1.0000 | ORAL_TABLET | ORAL | 0 refills | Status: AC | PRN

## 2019-02-16 MED ORDER — HYDROMORPHONE HCL 1 MG/ML IJ SOLN
INTRAMUSCULAR | Status: AC
  Filled 2019-02-16: qty 1

## 2019-02-16 MED ORDER — DEXAMETHASONE SODIUM PHOSPHATE 4 MG/ML IJ SOLN
INTRAMUSCULAR | Status: DC | PRN
  Administered 2019-02-16: 8 mg via INTRAVENOUS

## 2019-02-16 MED ORDER — 0.9 % SODIUM CHLORIDE (POUR BTL) OPTIME
TOPICAL | Status: DC | PRN
  Administered 2019-02-16 (×2): 1000 mL

## 2019-02-16 MED ORDER — MIDAZOLAM HCL 5 MG/5ML IJ SOLN
INTRAMUSCULAR | Status: DC | PRN
  Administered 2019-02-16: 2 mg via INTRAVENOUS

## 2019-02-16 MED ORDER — ARTIFICIAL TEARS OPHTHALMIC OINT
TOPICAL_OINTMENT | OPHTHALMIC | Status: AC
  Filled 2019-02-16: qty 3.5

## 2019-02-16 MED ORDER — LACTATED RINGERS IV SOLN: INTRAVENOUS | Status: DC

## 2019-02-16 MED ORDER — STERILE WATER FOR IRRIGATION IR SOLN
Status: DC | PRN
  Administered 2019-02-16: 1000 mL

## 2019-02-16 MED ORDER — SUGAMMADEX SODIUM 200 MG/2ML IV SOLN
INTRAVENOUS | Status: DC | PRN
  Administered 2019-02-16: 127 mg via INTRAVENOUS

## 2019-02-16 MED ORDER — IBUPROFEN 800 MG PO TABS
ORAL_TABLET | ORAL | Status: AC
  Filled 2019-02-16: qty 1

## 2019-02-16 MED ORDER — PROPOFOL 10 MG/ML IV BOLUS
INTRAVENOUS | Status: DC | PRN
  Administered 2019-02-16: 150 mg via INTRAVENOUS

## 2019-02-16 MED ORDER — LACTATED RINGERS IV SOLN
INTRAVENOUS | Status: DC | PRN
  Administered 2019-02-16 (×2): via INTRAVENOUS

## 2019-02-16 MED ORDER — HYDROCODONE-ACETAMINOPHEN 5-325 MG PO TABS
ORAL_TABLET | ORAL | Status: AC
  Filled 2019-02-16: qty 2

## 2019-02-16 MED ORDER — POVIDONE-IODINE 10 % EX SWAB: 2.0000 "application " | Freq: Once | CUTANEOUS | Status: DC

## 2019-02-16 MED ORDER — CEFAZOLIN SODIUM-DEXTROSE 2-4 GM/100ML-% IV SOLN
2.0000 g | INTRAVENOUS | Status: AC
  Administered 2019-02-16: 11:00:00 2 g via INTRAVENOUS
  Filled 2019-02-16: qty 100

## 2019-02-16 MED ORDER — LABETALOL HCL 5 MG/ML IV SOLN
INTRAVENOUS | Status: AC
  Filled 2019-02-16: qty 4

## 2019-02-16 MED ORDER — MIDAZOLAM HCL 2 MG/2ML IJ SOLN: 0.5000 mg | Freq: Once | INTRAMUSCULAR | Status: DC | PRN

## 2019-02-16 MED ORDER — FENTANYL CITRATE (PF) 100 MCG/2ML IJ SOLN
INTRAMUSCULAR | Status: DC | PRN
  Administered 2019-02-16 (×4): 50 ug via INTRAVENOUS
  Administered 2019-02-16: 75 ug via INTRAVENOUS
  Administered 2019-02-16: 25 ug via INTRAVENOUS
  Administered 2019-02-16: 50 ug via INTRAVENOUS

## 2019-02-16 MED ORDER — SEVOFLURANE IN SOLN
RESPIRATORY_TRACT | Status: AC
  Filled 2019-02-16: qty 250

## 2019-02-16 MED ORDER — BUPIVACAINE-EPINEPHRINE (PF) 0.5% -1:200000 IJ SOLN
INTRAMUSCULAR | Status: DC | PRN
  Administered 2019-02-16: 30 mL

## 2019-02-16 MED ORDER — HYDROCODONE-ACETAMINOPHEN 5-325 MG PO TABS
2.0000 | ORAL_TABLET | Freq: Once | ORAL | Status: AC
  Administered 2019-02-16: 2 via ORAL

## 2019-02-16 MED ORDER — DEXAMETHASONE SODIUM PHOSPHATE 10 MG/ML IJ SOLN
INTRAMUSCULAR | Status: AC
  Filled 2019-02-16: qty 1

## 2019-02-16 MED ORDER — MIDAZOLAM HCL 2 MG/2ML IJ SOLN
INTRAMUSCULAR | Status: AC
  Filled 2019-02-16: qty 2

## 2019-02-16 SURGICAL SUPPLY — 62 items
APL PRP STRL LF DISP 70% ISPRP (MISCELLANEOUS) ×2
BANDAGE ELASTIC 4 LF NS (GAUZE/BANDAGES/DRESSINGS) ×6 IMPLANT
BANDAGE ESMARK 4X12 BL STRL LF (DISPOSABLE) ×1 IMPLANT
BIT DRILL 3.5X122MM AO FIT (BIT) ×3 IMPLANT
BIT DRILL CANN 2.7 (BIT) ×2
BIT DRILL CANN 2.7MM (BIT) ×1
BIT DRILL SRG 2.7XCANN AO CPLG (BIT) ×1 IMPLANT
BIT DRL SRG 2.7XCANN AO CPLNG (BIT) ×1
BLADE SURG SZ10 CARB STEEL (BLADE) ×3 IMPLANT
BNDG CMPR 12X4 ELC STRL LF (DISPOSABLE) ×1
BNDG CMPR MED 5X4 ELC HKLP NS (GAUZE/BANDAGES/DRESSINGS) ×2
BNDG CMPR STD VLCR NS LF 5.8X3 (GAUZE/BANDAGES/DRESSINGS) ×1
BNDG CMPR STD VLCR NS LF 5.8X4 (GAUZE/BANDAGES/DRESSINGS) ×2
BNDG COHESIVE 4X5 TAN STRL (GAUZE/BANDAGES/DRESSINGS) ×3 IMPLANT
BNDG ELASTIC 3X5.8 VLCR NS LF (GAUZE/BANDAGES/DRESSINGS) ×3 IMPLANT
BNDG ELASTIC 4X5.8 VLCR NS LF (GAUZE/BANDAGES/DRESSINGS) ×6 IMPLANT
BNDG ESMARK 4X12 BLUE STRL LF (DISPOSABLE) ×3
BNDG GAUZE ELAST 4 BULKY (GAUZE/BANDAGES/DRESSINGS) ×3 IMPLANT
CHLORAPREP W/TINT 26 (MISCELLANEOUS) ×6 IMPLANT
CLOTH BEACON ORANGE TIMEOUT ST (SAFETY) ×3 IMPLANT
COVER LIGHT HANDLE STERIS (MISCELLANEOUS) ×6 IMPLANT
CUFF TOURNIQUET SINGLE 34IN LL (TOURNIQUET CUFF) ×3 IMPLANT
DECANTER SPIKE VIAL GLASS SM (MISCELLANEOUS) ×6 IMPLANT
DRAPE C-ARM FOLDED MOBILE STRL (DRAPES) ×3 IMPLANT
DRAPE PROXIMA HALF (DRAPES) ×3 IMPLANT
DRILL 2.6X122MM WL AO SHAFT (BIT) ×3 IMPLANT
GAUZE SPONGE 4X4 12PLY STRL (GAUZE/BANDAGES/DRESSINGS) ×3 IMPLANT
GAUZE XEROFORM 5X9 LF (GAUZE/BANDAGES/DRESSINGS) ×3 IMPLANT
GLOVE BIO SURGEON STRL SZ7 (GLOVE) ×6 IMPLANT
GLOVE BIOGEL PI IND STRL 7.0 (GLOVE) ×2 IMPLANT
GLOVE BIOGEL PI INDICATOR 7.0 (GLOVE) ×4
GLOVE SKINSENSE NS SZ8.0 LF (GLOVE) ×2
GLOVE SKINSENSE STRL SZ8.0 LF (GLOVE) ×1 IMPLANT
GLOVE SS N UNI LF 8.5 STRL (GLOVE) ×3 IMPLANT
GOWN STRL REUS W/TWL LRG LVL3 (GOWN DISPOSABLE) ×6 IMPLANT
GOWN STRL REUS W/TWL XL LVL3 (GOWN DISPOSABLE) ×3 IMPLANT
INST SET MINOR BONE (KITS) ×3 IMPLANT
K-WIRE ORTHOPEDIC 1.4X150L (WIRE) ×6
KIT TURNOVER KIT A (KITS) ×3 IMPLANT
KWIRE ORTHOPEDIC 1.4X150L (WIRE) ×2 IMPLANT
MANIFOLD NEPTUNE II (INSTRUMENTS) ×3 IMPLANT
NEEDLE HYPO 21X1.5 SAFETY (NEEDLE) ×3 IMPLANT
NS IRRIG 1000ML POUR BTL (IV SOLUTION) ×3 IMPLANT
PACK BASIC LIMB (CUSTOM PROCEDURE TRAY) ×3 IMPLANT
PAD ABD 5X9 TENDERSORB (GAUZE/BANDAGES/DRESSINGS) ×6 IMPLANT
PAD ARMBOARD 7.5X6 YLW CONV (MISCELLANEOUS) ×3 IMPLANT
PAD CAST 4YDX4 CTTN HI CHSV (CAST SUPPLIES) ×1 IMPLANT
PADDING CAST COTTON 4X4 STRL (CAST SUPPLIES) ×3
PLATE 7H 96MM (Plate) ×3 IMPLANT
SCREW 3.5X10MM (Screw) ×9 IMPLANT
SCREW 46X4.0MM (Screw) ×3 IMPLANT
SCREW BONE 18 (Screw) ×3 IMPLANT
SCREW BONE 3.5X20MM (Screw) ×6 IMPLANT
SCREW BONE NON-LCKING 3.5X12MM (Screw) ×3 IMPLANT
SCREW FT 40X4.0MM (Screw) ×3 IMPLANT
SET BASIN LINEN APH (SET/KITS/TRAYS/PACK) ×3 IMPLANT
SPONGE LAP 18X18 RF (DISPOSABLE) ×3 IMPLANT
STAPLER VISISTAT 35W (STAPLE) ×3 IMPLANT
SUT ETHILON 3 0 FSL (SUTURE) ×3 IMPLANT
SUT MON AB 0 CT1 (SUTURE) ×3 IMPLANT
SYR 30ML LL (SYRINGE) ×3 IMPLANT
SYR BULB IRRIGATION 50ML (SYRINGE) ×3 IMPLANT

## 2019-02-16 NOTE — Transfer of Care (Signed)
Immediate Anesthesia Transfer of Care Note  Patient: Harlen Bouse Dunivan  Procedure(s) Performed: OPEN REDUCTION INTERNAL FIXATION (ORIF) ANKLE FRACTURE (Right Ankle) CAST APPLICATION (Right Ankle)  Patient Location: PACU  Anesthesia Type:General  Level of Consciousness: awake, oriented and patient cooperative  Airway & Oxygen Therapy: Patient Spontanous Breathing  Post-op Assessment: Report given to RN and Post -op Vital signs reviewed and stable  Post vital signs: Reviewed and stable  Last Vitals:  Vitals Value Taken Time  BP 151/105 02/16/19 1233  Temp 36.9 C 02/16/19 1233  Pulse 95 02/16/19 1238  Resp 15 02/16/19 1238  SpO2 100 % 02/16/19 1238  Vitals shown include unvalidated device data.  Last Pain:  Vitals:   02/16/19 1233  TempSrc:   PainSc: (P) 0-No pain      Patients Stated Pain Goal: 6 ([phone])  Complications: No apparent anesthesia complications

## 2019-02-16 NOTE — Telephone Encounter (Signed)
Tracey at [facility] called asking for you to in discharge orders for this patient.

## 2019-02-16 NOTE — Anesthesia Postprocedure Evaluation (Signed)
Anesthesia Post Note  Patient: [name]  Procedure(s) Performed: OPEN REDUCTION INTERNAL FIXATION (ORIF) ANKLE FRACTURE (Right Ankle) CAST APPLICATION (Right Ankle)  Patient location during evaluation: PACU Anesthesia Type: General Level of consciousness: awake and alert and oriented Pain management: pain level controlled Vital Signs Assessment: post-procedure vital signs reviewed and stable Respiratory status: spontaneous breathing Cardiovascular status: stable Postop Assessment: no apparent nausea or vomiting Anesthetic complications: no     Last Vitals:  Vitals:   02/16/19 1315 02/16/19 1319  BP: (!) 148/92   Pulse: 100 (!) 101  Resp: 18 17  Temp:    SpO2: (!) 88% 92%    Last Pain:  Vitals:   02/16/19 1319  TempSrc:   PainSc: 3                  Awa Bachicha A

## 2019-02-16 NOTE — Anesthesia Procedure Notes (Signed)
Procedure Name: Intubation Date/Time: 02/16/2019 10:50 AM Performed by: Ollen Bowl, CRNA Pre-anesthesia Checklist: Patient identified, Patient being monitored, Timeout performed, Emergency Drugs available and Suction available Patient Re-evaluated:Patient Re-evaluated prior to induction Oxygen Delivery Method: Circle system utilized Preoxygenation: Pre-oxygenation with 100% oxygen Induction Type: IV induction Ventilation: Mask ventilation without difficulty Laryngoscope Size: Mac and 3 Grade View: Grade I Tube type: Oral Tube size: 7.0 mm Number of attempts: 1 Airway Equipment and Method: Stylet Placement Confirmation: ETT inserted through vocal cords under direct vision,  positive ETCO2 and breath sounds checked- equal and bilateral Secured at: 21 cm Tube secured with: Tape Dental Injury: Teeth and Oropharynx as per pre-operative assessment

## 2019-02-16 NOTE — Anesthesia Preprocedure Evaluation (Signed)
Anesthesia Evaluation  Patient identified by MRN, date of birth, ID band Patient awake    Reviewed: Allergy & Precautions, NPO status , Patient's Chart, lab work & pertinent test results  History of Anesthesia Complications (+) PONV and history of anesthetic complications  Airway Mallampati: I  TM Distance: >3 FB Neck ROM: Full    Dental no notable dental hx. (+) Teeth Intact   Pulmonary asthma ,  States no inhaler use ~1 year   Pulmonary exam normal breath sounds clear to auscultation       Cardiovascular Exercise Tolerance: Good hypertension, Pt. on medications negative cardio ROS Normal cardiovascular examI Rhythm:Regular Rate:Normal     Neuro/Psych Anxiety Depression negative neurological ROS  negative psych ROS   GI/Hepatic negative GI ROS, Neg liver ROS,   Endo/Other  neg diabetesHypothyroidism   Renal/GU negative Renal ROS  negative genitourinary   Musculoskeletal negative musculoskeletal ROS (+)   Abdominal   Peds negative pediatric ROS (+)  Hematology negative hematology ROS (+)   Anesthesia Other Findings Klippel-File syndrome   Reproductive/Obstetrics negative OB ROS                             Anesthesia Physical Anesthesia Plan  ASA: II  Anesthesia Plan: General   Post-op Pain Management:    Induction: Intravenous  PONV Risk Score and Plan: 4 or greater and Ondansetron, Dexamethasone, Treatment may vary due to age or medical condition, Promethazine and Scopolamine patch - Pre-op  Airway Management Planned: Oral ETT  Additional Equipment:   Intra-op Plan:   Post-operative Plan: Extubation in OR  Informed Consent: I have reviewed the patients History and Physical, chart, labs and discussed the procedure including the risks, benefits and alternatives for the proposed anesthesia with the patient or authorized representative who has indicated his/her  understanding and acceptance.     Dental advisory given  Plan Discussed with: CRNA  Anesthesia Plan Comments: (Plan Full PPE use  Plan GETA d/w pt -WTP with same )        Anesthesia Quick Evaluation

## 2019-02-16 NOTE — Interval H&P Note (Signed)
History and Physical Interval Note:  02/16/2019 10:09 AM  [name]  has presented today for surgery, with the diagnosis of Bimalleolar right ankle fracture.  The various methods of treatment have been discussed with the patient and family. After consideration of risks, benefits and other options for treatment, the patient has consented to  Procedure(s): OPEN REDUCTION INTERNAL FIXATION (ORIF) ANKLE FRACTURE (Right) as a surgical intervention.  The patient's history has been reviewed, patient examined, no change in status, stable for surgery.  I have reviewed the patient's chart and labs.  Questions were answered to the patient's satisfaction.     Arther Abbott

## 2019-02-16 NOTE — Op Note (Signed)
02/16/2019  12:29 PM  PATIENT:  [name]  38 y.o. female  PRE-OPERATIVE DIAGNOSIS:  Bimalleolar right ankle fracture  POST-OPERATIVE DIAGNOSIS:  Bimalleolar right ankle fracture  FINDINGS: BIMALLEOLAR ANKLE FRACTURE WITH INTACT SYNDESMOSIS  PROCEDURE:  Procedure(s): OPEN REDUCTION INTERNAL FIXATION (ORIF) ANKLE FRACTURE (123456 Splint  APPLICATION (Right)  Ankle solutions, STRYKER 7 hole one third tubular plate 1 interfrag screw 6 screws 2 screws medially 1 fully threaded 1 partially-threaded  SURGEON:  Surgeon(s) and Role:    Carole Civil, MD - Primary  The surgery was done as follows  The patient was seen in preop the surgical site was confirmed his right ankle and marked.  The patient was taken to the operating room she had general anesthesia she had Ancef.  She was in the supine position.  Tourniquet was placed on her thigh.  4 large blankets were placed on the surgical side and the right leg was placed on top of it.  After sterile prep and drape and timeout the limb was exsanguinated for and Esmarch tourniquet was elevated 200 mmHg.  I started the surgery on the lateral side and made a lateral incision slightly anterior in order to view the syndesmosis.  Full-thickness incision was made down to bone and then blunt dissection was carried out proximally to expose the proximal fibula.  The fracture site was evaluated opened irrigated debrided and then clamps were used to reduce the fracture manually  Radiographs confirm the fracture reduction.  I placed an interfrag screw with a 3 5 glide hole and a 2 5 distal hole and then measured for the screw and then placed a screw which reduce the fracture and held it securely.  I then fashioned a lateral one third tubular plate and put it in neutralization mode and starting at the fracture site progressively placed the screws using AO technique  Final radiographs confirmed ankle mortise reduction fibular length so-called  dime sign distally.  I then open the medial side with a curved incision to avoid the abrasion on the medial side.  Again full-thickness flaps were made the fracture was opened the joint was inspected there was some debris this was removed the fracture site was irrigated debrided and then held with a pointed reduction clamp.  Radiograph confirmed reduction  I then placed 2 threaded pins in the fracture and then drilled over the pins and then placed a partially threaded screw followed by a fully threaded screw.  Radiographs including C arm live fluoroscopy were used to confirm screw position.  Fracture was reduced well.  Mortise was reduced well.  The syndesmosis was visualized directly found to be intact some of the hook test was also used and this was done under fluoroscopy.  The wounds were irrigated the medial side was closed with 3-0 nylon interrupted sutures and the lateral side was closed with 0 Monocryl and staples.  I injected 20 cc of Marcaine with epinephrine laterally 10 cc medially and then applied sterile dressings and a sugar tong splint with ankle in neutral position  The tourniquet was released prior to application of the splint  Postop plan is for 2 weeks of nonweightbearing and then 4 weeks of weightbearing in a cast or boot.  X-rays at 2 6 and 12 weeks  If we are able to place the walking boot she can do active range of motion exercises as well  PHYSICIAN ASSISTANT:   ASSISTANTS: BETTY ASHLEY   ANESTHESIA:   general  EBL:  5  mL   BLOOD ADMINISTERED:none  DRAINS: none   LOCAL MEDICATIONS USED:  MARCAINE     SPECIMEN:  No Specimen  DISPOSITION OF SPECIMEN:  N/A  COUNTS:  YES  TOURNIQUET:   Total Tourniquet Time Documented: Thigh (Right) - 76 minutes Total: Thigh (Right) - 76 minutes   DICTATION: .Viviann Spare Dictation  PLAN OF CARE: Discharge to home after PACU  PATIENT DISPOSITION:  PACU - hemodynamically stable.   Delay start of Pharmacological VTE agent  (>24hrs) due to surgical blood loss or risk of bleeding: not applicable

## 2019-02-16 NOTE — Brief Op Note (Signed)
02/16/2019  12:29 PM  PATIENT:  [name]  38 y.o. female  PRE-OPERATIVE DIAGNOSIS:  Bimalleolar right ankle fracture  POST-OPERATIVE DIAGNOSIS:  Bimalleolar right ankle fracture  FINDINGS: BIMALLEOLAR ANKLE FRACTURE WITH INTACT SYNDESMOSIS  PROCEDURE:  Procedure(s): OPEN REDUCTION INTERNAL FIXATION (ORIF) ANKLE FRACTURE (123456 Splint  APPLICATION (Right)  Ankle solutions, STRYKER 7 hole one third tubular plate 1 interfrag screw 6 screws 2 screws medially 1 fully threaded 1 partially-threaded  SURGEON:  Surgeon(s) and Role:    Carole Civil, MD - Primary  The surgery was done as follows  The patient was seen in preop the surgical site was confirmed his right ankle and marked.  The patient was taken to the operating room she had general anesthesia she had Ancef.  She was in the supine position.  Tourniquet was placed on her thigh.  4 large blankets were placed on the surgical side and the right leg was placed on top of it.  After sterile prep and drape and timeout the limb was exsanguinated for and Esmarch tourniquet was elevated 200 mmHg.  I started the surgery on the lateral side and made a lateral incision slightly anterior in order to view the syndesmosis.  Full-thickness incision was made down to bone and then blunt dissection was carried out proximally to expose the proximal fibula.  The fracture site was evaluated opened irrigated debrided and then clamps were used to reduce the fracture manually  Radiographs confirm the fracture reduction.  I placed an interfrag screw with a 3 5 glide hole and a 2 5 distal hole and then measured for the screw and then placed a screw which reduce the fracture and held it securely.  I then fashioned a lateral one third tubular plate and put it in neutralization mode and starting at the fracture site progressively placed the screws using AO technique  Final radiographs confirmed ankle mortise reduction fibular length so-called  dime sign distally.  I then open the medial side with a curved incision to avoid the abrasion on the medial side.  Again full-thickness flaps were made the fracture was opened the joint was inspected there was some debris this was removed the fracture site was irrigated debrided and then held with a pointed reduction clamp.  Radiograph confirmed reduction  I then placed 2 threaded pins in the fracture and then drilled over the pins and then placed a partially threaded screw followed by a fully threaded screw.  Radiographs including C arm live fluoroscopy were used to confirm screw position.  Fracture was reduced well.  Mortise was reduced well.  The syndesmosis was visualized directly found to be intact some of the hook test was also used and this was done under fluoroscopy.  The wounds were irrigated the medial side was closed with 3-0 nylon interrupted sutures and the lateral side was closed with 0 Monocryl and staples.  I injected 20 cc of Marcaine with epinephrine laterally 10 cc medially and then applied sterile dressings and a sugar tong splint with ankle in neutral position  The tourniquet was released prior to application of the splint  Postop plan is for 2 weeks of nonweightbearing and then 4 weeks of weightbearing in a cast or boot.  X-rays at 2 6 and 12 weeks  If we are able to place the walking boot she can do active range of motion exercises as well  PHYSICIAN ASSISTANT:   ASSISTANTS: BETTY ASHLEY   ANESTHESIA:   general  EBL:  5  mL   BLOOD ADMINISTERED:none  DRAINS: none   LOCAL MEDICATIONS USED:  MARCAINE     SPECIMEN:  No Specimen  DISPOSITION OF SPECIMEN:  N/A  COUNTS:  YES  TOURNIQUET:   Total Tourniquet Time Documented: Thigh (Right) - 76 minutes Total: Thigh (Right) - 76 minutes   DICTATION: .Viviann Spare Dictation  PLAN OF CARE: Discharge to home after PACU  PATIENT DISPOSITION:  PACU - hemodynamically stable.   Delay start of Pharmacological VTE agent  (>24hrs) due to surgical blood loss or risk of bleeding: not applicable

## 2019-02-17 ENCOUNTER — Encounter (HOSPITAL_COMMUNITY): Payer: Self-pay | Admitting: Orthopedic Surgery

## 2019-02-24 ENCOUNTER — Telehealth: Payer: Self-pay | Admitting: Orthopedic Surgery

## 2019-02-24 ENCOUNTER — Other Ambulatory Visit: Payer: Self-pay

## 2019-02-24 ENCOUNTER — Ambulatory Visit (INDEPENDENT_AMBULATORY_CARE_PROVIDER_SITE_OTHER): Payer: BC Managed Care – PPO | Admitting: Orthopedic Surgery

## 2019-02-24 ENCOUNTER — Encounter: Payer: Self-pay | Admitting: Orthopedic Surgery

## 2019-02-24 DIAGNOSIS — Z8781 Personal history of (healed) traumatic fracture: Secondary | ICD-10-CM

## 2019-02-24 DIAGNOSIS — Z9889 Other specified postprocedural states: Secondary | ICD-10-CM

## 2019-02-24 MED ORDER — PROMETHAZINE HCL 12.5 MG PO TABS: 12.5000 mg | ORAL_TABLET | Freq: Four times a day (QID) | ORAL | 0 refills | Status: DC | PRN

## 2019-02-24 NOTE — Telephone Encounter (Signed)
Ibuprofen should be fine

## 2019-02-24 NOTE — Progress Notes (Signed)
Chief Complaint  Patient presents with  . Post-op Follow-up    02/16/19 ORIF right ankle   POD 8  Ibuprofen for pain   Dressing coverderm   Immobilization : cam walker   Refill phenergan  Meds ordered this encounter  Medications  . promethazine (PHENERGAN) 12.5 MG tablet    Sig: Take 1 tablet (12.5 mg total) by mouth every 6 (six) hours as needed for nausea or vomiting.    Dispense:  30 tablet    Refill:  0     Encounter Diagnosis  Name Primary?  . S/P ORIF (open reduction internal fixation) fracture right ankle 02/16/2019 Yes   FU 1 WEEK  NO WB

## 2019-02-24 NOTE — Telephone Encounter (Signed)
I called her to advise. She voiced understanding.  

## 2019-02-24 NOTE — Patient Instructions (Signed)
NO WEIGHT BEARING  FU 1 WEEK  REMOVE BRACE 3 X A DAY MOVE THE ANKLE UP AND DOWN 25 X

## 2019-02-24 NOTE — Telephone Encounter (Signed)
Patient called back with question, said forgot to ask when here for visit today, about discomfort and tenderness around right knee - asking if using rolling walker for her right ankle fracture/surgery may be contributing to it?  Also re-checking about medication - asking if a muscle relaxer or non-narcotic may be prescribed? (Crestone)  States does not want to take narcotic pain medication as discussed.  Patient ph# [phone]

## 2019-03-01 ENCOUNTER — Other Ambulatory Visit: Payer: Self-pay | Admitting: Orthopedic Surgery

## 2019-03-01 DIAGNOSIS — Z8781 Personal history of (healed) traumatic fracture: Secondary | ICD-10-CM

## 2019-03-01 DIAGNOSIS — Z9889 Other specified postprocedural states: Secondary | ICD-10-CM

## 2019-03-01 MED ORDER — HYDROCODONE-ACETAMINOPHEN 5-325 MG PO TABS: 1.0000 | ORAL_TABLET | Freq: Four times a day (QID) | ORAL | 0 refills | Status: DC | PRN

## 2019-03-01 NOTE — Telephone Encounter (Signed)
[name] called and stated her ankle is really started to have some shooting pains.  She wanted to know if she could having something for the pain?  She uses [facility]

## 2019-03-03 ENCOUNTER — Ambulatory Visit (INDEPENDENT_AMBULATORY_CARE_PROVIDER_SITE_OTHER): Payer: BC Managed Care – PPO | Admitting: Orthopedic Surgery

## 2019-03-03 ENCOUNTER — Ambulatory Visit: Payer: BC Managed Care – PPO

## 2019-03-03 ENCOUNTER — Encounter: Payer: Self-pay | Admitting: Orthopedic Surgery

## 2019-03-03 ENCOUNTER — Other Ambulatory Visit: Payer: Self-pay

## 2019-03-03 VITALS — BP 130/94 | HR 87 | Temp 97.2°F | Ht 60.0 in | Wt 146.0 lb

## 2019-03-03 DIAGNOSIS — S82891D Other fracture of right lower leg, subsequent encounter for closed fracture with routine healing: Secondary | ICD-10-CM | POA: Diagnosis not present

## 2019-03-03 NOTE — Progress Notes (Signed)
Chief Complaint  Patient presents with  . Follow-up    Recheck on right ankle, DOS 02-16-19.   Postop day #15 ORIF right ankle  Wounds look clean dry and intact sutures and staples were removed  Meds: norco / ibuprofen  WB STATUS: cam walker   FU 4 weeks XRAY

## 2019-03-11 ENCOUNTER — Other Ambulatory Visit: Payer: Self-pay | Admitting: Orthopedic Surgery

## 2019-03-11 DIAGNOSIS — Z8781 Personal history of (healed) traumatic fracture: Secondary | ICD-10-CM

## 2019-03-11 DIAGNOSIS — Z9889 Other specified postprocedural states: Secondary | ICD-10-CM

## 2019-03-23 ENCOUNTER — Other Ambulatory Visit: Payer: Self-pay | Admitting: Orthopedic Surgery

## 2019-03-23 DIAGNOSIS — Z9889 Other specified postprocedural states: Secondary | ICD-10-CM

## 2019-03-23 DIAGNOSIS — Z8781 Personal history of (healed) traumatic fracture: Secondary | ICD-10-CM

## 2019-03-23 NOTE — Telephone Encounter (Signed)
Patient called to request refill:  acetaminophen (NORCO/VICODIN) 5-325 MG tablet 30 tablet   -[place]

## 2019-03-24 ENCOUNTER — Other Ambulatory Visit: Payer: Self-pay | Admitting: Orthopedic Surgery

## 2019-03-24 ENCOUNTER — Other Ambulatory Visit: Payer: Self-pay

## 2019-03-24 DIAGNOSIS — Z9889 Other specified postprocedural states: Secondary | ICD-10-CM

## 2019-03-24 DIAGNOSIS — Z8781 Personal history of (healed) traumatic fracture: Secondary | ICD-10-CM

## 2019-03-24 NOTE — Telephone Encounter (Signed)
Patient requests refill on Hydrocodone/Acetaminophen 5-325  Mgs.  Qty 30  Sig: TAKE 1 TABLET BY MOUTH EVERY 6 HOURS AS NEEDED FOR MODERATE PAIN        Patient states she uses [facility]

## 2019-03-24 NOTE — Telephone Encounter (Signed)
She hould be taking ibuprofen and or tylenol now

## 2019-03-29 ENCOUNTER — Other Ambulatory Visit: Payer: Self-pay | Admitting: Family Medicine

## 2019-03-31 ENCOUNTER — Other Ambulatory Visit: Payer: Self-pay

## 2019-03-31 ENCOUNTER — Ambulatory Visit (INDEPENDENT_AMBULATORY_CARE_PROVIDER_SITE_OTHER): Payer: BC Managed Care – PPO | Admitting: Orthopedic Surgery

## 2019-03-31 ENCOUNTER — Ambulatory Visit: Payer: BC Managed Care – PPO

## 2019-03-31 ENCOUNTER — Encounter: Payer: Self-pay | Admitting: Orthopedic Surgery

## 2019-03-31 DIAGNOSIS — Z8781 Personal history of (healed) traumatic fracture: Secondary | ICD-10-CM

## 2019-03-31 DIAGNOSIS — S82891D Other fracture of right lower leg, subsequent encounter for closed fracture with routine healing: Secondary | ICD-10-CM | POA: Diagnosis not present

## 2019-03-31 DIAGNOSIS — Z9889 Other specified postprocedural states: Secondary | ICD-10-CM

## 2019-03-31 NOTE — Patient Instructions (Signed)
More of the same  ok to drive

## 2019-03-31 NOTE — Telephone Encounter (Signed)
Noted  

## 2019-03-31 NOTE — Progress Notes (Signed)
Chief Complaint  Patient presents with  . Ankle Pain    ORIF right ankle 02/16/2019    Patient is 6 weeks postop status post ORIF right ankle  Radiographs show 2 medial screws and 1 lateral plate  Ankle mortise is intact and the fractures are healing appropriately  All wounds healed she is regained full range of motion she has no neurovascular deficits she is walking well in a cam walker  Plan is for her to continue weightbearing as tolerated in a cam walker  She can remove the cam walker for driving  Follow-up 6 weeks

## 2019-04-13 ENCOUNTER — Other Ambulatory Visit: Payer: Self-pay | Admitting: Adult Health

## 2019-04-15 ENCOUNTER — Other Ambulatory Visit (INDEPENDENT_AMBULATORY_CARE_PROVIDER_SITE_OTHER): Payer: BC Managed Care – PPO | Admitting: *Deleted

## 2019-04-15 DIAGNOSIS — Z23 Encounter for immunization: Secondary | ICD-10-CM | POA: Diagnosis not present

## 2019-04-27 ENCOUNTER — Telehealth: Payer: Self-pay | Admitting: Family Medicine

## 2019-04-27 NOTE — Telephone Encounter (Signed)
Patient is requesting to get her xanax early not due until the 12th of the month last filled on 10/12 and the pharmacist called to get ok to fill early also. Patient was last seen 09/30/2018.  Please advise

## 2019-04-27 NOTE — Telephone Encounter (Signed)
Called [facility] and let tammy know dr Nicki Reaper is ok with pt filling rx today since going out of town. Called pt and left message on her voicemail that she could pick up rx today.

## 2019-04-27 NOTE — Telephone Encounter (Signed)
It would be fine to go ahead and give the approval to get this early via pharmacy

## 2019-04-27 NOTE — Telephone Encounter (Signed)
Pt is going out of town and leaving tomorrow and wants to try to pick it up this afternoon if possible.

## 2019-05-06 DIAGNOSIS — Z8781 Personal history of (healed) traumatic fracture: Secondary | ICD-10-CM | POA: Insufficient documentation

## 2019-05-06 DIAGNOSIS — Z9889 Other specified postprocedural states: Secondary | ICD-10-CM | POA: Insufficient documentation

## 2019-05-10 ENCOUNTER — Encounter: Payer: Self-pay | Admitting: Family Medicine

## 2019-05-10 ENCOUNTER — Ambulatory Visit: Payer: BC Managed Care – PPO | Admitting: Orthopedic Surgery

## 2019-05-10 ENCOUNTER — Telehealth: Payer: Self-pay | Admitting: Family Medicine

## 2019-05-10 ENCOUNTER — Other Ambulatory Visit: Payer: Self-pay

## 2019-05-10 ENCOUNTER — Ambulatory Visit (INDEPENDENT_AMBULATORY_CARE_PROVIDER_SITE_OTHER): Payer: BC Managed Care – PPO | Admitting: Family Medicine

## 2019-05-10 DIAGNOSIS — J019 Acute sinusitis, unspecified: Secondary | ICD-10-CM | POA: Diagnosis not present

## 2019-05-10 DIAGNOSIS — Z20828 Contact with and (suspected) exposure to other viral communicable diseases: Secondary | ICD-10-CM | POA: Diagnosis not present

## 2019-05-10 DIAGNOSIS — Z20822 Contact with and (suspected) exposure to covid-19: Secondary | ICD-10-CM

## 2019-05-10 MED ORDER — HYDROCODONE-HOMATROPINE 5-1.5 MG/5ML PO SYRP: ORAL_SOLUTION | ORAL | 0 refills | Status: DC

## 2019-05-10 MED ORDER — AMOXICILLIN-POT CLAVULANATE 875-125 MG PO TABS: 1.0000 | ORAL_TABLET | Freq: Two times a day (BID) | ORAL | 0 refills | Status: DC

## 2019-05-10 NOTE — Telephone Encounter (Signed)
Please advise. Thank you

## 2019-05-10 NOTE — Telephone Encounter (Signed)
Patient calling back because she has decided she does want cough meds called in [place].

## 2019-05-10 NOTE — Progress Notes (Signed)
   Subjective:    Patient ID: [name], female    DOB: 08-01-1980, 38 y.o.   MRN: [id number]  Sinusitis This is a new problem. Episode onset: 2 days. Associated symptoms include congestion and coughing. (Feels crappy, fatigue) Treatments tried: robutissum with honey, albuterol inhaler.   Virtual Visit via Video Note  I connected with Adysin Spitale Mcmeen on 05/10/19 at 11:00 AM EST by a video enabled telemedicine application and verified that I am speaking with the correct person using two identifiers.  Location: Patient: home Provider: office   I discussed the limitations of evaluation and management by telemedicine and the availability of in person appointments. The patient expressed understanding and agreed to proceed.  History of Present Illness:    Observations/Objective:   Assessment and Plan:   Follow Up Instructions:    I discussed the assessment and treatment plan with the patient. The patient was provided an opportunity to ask questions and all were answered. The patient agreed with the plan and demonstrated an understanding of the instructions.   The patient was advised to call back or seek an in-person evaluation if the symptoms worsen or if the condition fails to improve as anticipated.  I provided 20 minutes of non-face-to-face time during this encounter.   Patient has mostly attempted to stay fairly apart from others.  However positive potential exposures.  Congestion cough.  Stuffiness.  Sore throat.  Achy.  Diminished energy.    Review of Systems  HENT: Positive for congestion.   Respiratory: Positive for cough.        Objective:   Physical Exam  Virtual      Assessment & Plan:  Impression potential rhinosinusitis however suspected COVID-19 definitely.  Discussed with patient.  Antibiotics prescribed.  Symptom care discussed cough medicine prescribed COVID-19 test

## 2019-05-14 ENCOUNTER — Telehealth: Payer: Self-pay | Admitting: Family Medicine

## 2019-05-14 NOTE — Telephone Encounter (Signed)
Pt did not go to get testing. Pt states that she is not having symptoms. Pt states she has no one to help her for 14 days. Pt is drinking plenty of fluids; pt is quarantining her self anyway.

## 2019-05-14 NOTE — Telephone Encounter (Signed)
Tried to contact patient to see if she went to have COVID testing done. Left message to return call

## 2019-05-17 ENCOUNTER — Telehealth: Payer: Self-pay | Admitting: Family Medicine

## 2019-05-17 NOTE — Telephone Encounter (Signed)
Pt would like to know if Dr. Nicki Reaper would prescribe something to help her sleep. She is having trouble here recently getting to sleep.   Adrian, [place]

## 2019-05-17 NOTE — Telephone Encounter (Signed)
Left message to return call 

## 2019-05-17 NOTE — Telephone Encounter (Signed)
Pt is on our list to follow up on for covid testing so when she called back I asked her if she went because we dont have results. Pt did not go for covid testing but states she is quarantining and feeling much better.    trouble sleeping. She is changing jobs and under stress about that. Has a stressful job and cant shut down at night. Tried lunesta in the past and it worked well. Would like something to calm her down so she can sleep

## 2019-05-18 ENCOUNTER — Other Ambulatory Visit: Payer: Self-pay | Admitting: Family Medicine

## 2019-05-18 NOTE — Telephone Encounter (Signed)
Left message to return call 

## 2019-05-18 NOTE — Telephone Encounter (Signed)
[name]  There is no good answer on this. The patient already gets 30 Xanax is a month. There are sleeping medicines that can help a person sleep such as Ambien or Lunesta but if she is by herself with the children this medication could cause her to sleep so hard that she would not wake up during the night if something was going on  Ideally she would use her Xanax in the evening time to help with any anxiousness as well as help her calm herself so she could rest

## 2019-05-18 NOTE — Telephone Encounter (Signed)
Discussed with pt and pt verbalized understanding.  °

## 2019-05-19 ENCOUNTER — Ambulatory Visit (INDEPENDENT_AMBULATORY_CARE_PROVIDER_SITE_OTHER): Payer: BC Managed Care – PPO

## 2019-05-19 ENCOUNTER — Ambulatory Visit: Payer: BC Managed Care – PPO | Admitting: Orthopedic Surgery

## 2019-05-19 ENCOUNTER — Other Ambulatory Visit: Payer: Self-pay

## 2019-05-19 DIAGNOSIS — S82891D Other fracture of right lower leg, subsequent encounter for closed fracture with routine healing: Secondary | ICD-10-CM

## 2019-05-19 DIAGNOSIS — Z8781 Personal history of (healed) traumatic fracture: Secondary | ICD-10-CM

## 2019-05-19 DIAGNOSIS — Z9889 Other specified postprocedural states: Secondary | ICD-10-CM

## 2019-05-19 NOTE — Progress Notes (Signed)
Chief Complaint  Patient presents with  . Follow-up    ORIF Right Ankle DOS 02/16/19    Postop visit for [name] who had a ORIF of the right ankle with medial lateral fixation she is doing great she has excellent range of motion she is worn the Cam walker religiously until recently where she drove with the boot off successfully  Her ankle motion is great her wounds have healed her x-ray shows complete fracture healing there is no arthritis hardware is intact  I told her not to do any strenuous activity for 6 months from surgery which would put it in March  She can resume walking normal activities see me in 8 to 9 months for x-ray in 1 year follow-up  Encounter Diagnoses  Name Primary?  . S/P ORIF (open reduction internal fixation) fracture right ankle 02/16/2019 Yes  . Closed fracture of right ankle with routine healing, subsequent encounter

## 2019-05-25 ENCOUNTER — Other Ambulatory Visit: Payer: Self-pay

## 2019-05-25 ENCOUNTER — Ambulatory Visit (INDEPENDENT_AMBULATORY_CARE_PROVIDER_SITE_OTHER): Payer: BC Managed Care – PPO | Admitting: Family Medicine

## 2019-05-25 DIAGNOSIS — E039 Hypothyroidism, unspecified: Secondary | ICD-10-CM

## 2019-05-25 DIAGNOSIS — F411 Generalized anxiety disorder: Secondary | ICD-10-CM

## 2019-05-25 DIAGNOSIS — Z13220 Encounter for screening for lipoid disorders: Secondary | ICD-10-CM

## 2019-05-25 DIAGNOSIS — I10 Essential (primary) hypertension: Secondary | ICD-10-CM | POA: Diagnosis not present

## 2019-05-25 MED ORDER — LISINOPRIL 5 MG PO TABS: ORAL_TABLET | ORAL | 1 refills | Status: DC

## 2019-05-25 MED ORDER — ALPRAZOLAM 0.5 MG PO TABS: 0.5000 mg | ORAL_TABLET | Freq: Every day | ORAL | 5 refills | Status: DC | PRN

## 2019-05-25 NOTE — Progress Notes (Signed)
   Subjective:    Patient ID: [name], female    DOB: May 16, 1981, 38 y.o.   MRN: [id number]  HPImed check up on anxiety. Takes xanax and states it is doing well. Pt states no other concerns today.  Mild to moderate anxiety related issues. Denies any type of depression currently. Uses Xanax intermittently to help with stress and sometimes help with sleep  Has blood pressure related issues. Tries to do the best she can trying to minimize salt use. She does try to visit activity she does take her medication on a regular basis  Hypothyroidism managed by gynecology but patient requests lab work. Virtual Visit via Video Note  I connected with [name] on 05/25/19 at 10:00 AM EST by a video enabled telemedicine application and verified that I am speaking with the correct person using two identifiers.  Location: Patient: home Provider: office   I discussed the limitations of evaluation and management by telemedicine and the availability of in person appointments. The patient expressed understanding and agreed to proceed.  History of Present Illness:    Observations/Objective:   Assessment and Plan:   Follow Up Instructions:    I discussed the assessment and treatment plan with the patient. The patient was provided an opportunity to ask questions and all were answered. The patient agreed with the plan and demonstrated an understanding of the instructions.   The patient was advised to call back or seek an in-person evaluation if the symptoms worsen or if the condition fails to improve as anticipated.  I provided 17 minutes of non-face-to-face time during this encounter.      Review of Systems  Constitutional: Negative for activity change, appetite change and fatigue.  HENT: Negative for congestion and rhinorrhea.   Respiratory: Negative for cough and shortness of breath.   Cardiovascular: Negative for chest pain and leg swelling.  Gastrointestinal: Negative  for abdominal pain and diarrhea.  Endocrine: Negative for polydipsia and polyphagia.  Skin: Negative for color change.  Neurological: Negative for dizziness and weakness.  Psychiatric/Behavioral: Negative for behavioral problems and confusion.       Objective:   Physical Exam  Patient had virtual visit Appears to be in no distress Atraumatic Neuro able to relate and oriented No apparent resp distress Color normal       Assessment & Plan:  Patient was seen today regarding hypothyroidism.  Importance of healthy diet, regular physical activity was discussed.  Importance of compliance with medication and regular checks regarding this was discussed.   Blood pressure good control continue current measures  Anxiety occasional insomnia managed by Xanax and self-help techniques under good control currently not depressed  Will check thyroid function to share with gynecology  Follow-up 6 months

## 2019-07-07 ENCOUNTER — Other Ambulatory Visit: Payer: Self-pay | Admitting: Adult Health

## 2019-08-12 ENCOUNTER — Other Ambulatory Visit: Payer: Self-pay

## 2019-08-12 ENCOUNTER — Ambulatory Visit: Payer: BC Managed Care – PPO | Attending: Internal Medicine

## 2019-08-12 DIAGNOSIS — Z23 Encounter for immunization: Secondary | ICD-10-CM | POA: Insufficient documentation

## 2019-08-12 NOTE — Progress Notes (Signed)
   Covid-19 Vaccination Clinic  Name:  [name]    MRN: [id number] DOB: 1981-03-27  08/12/2019  [name] was observed post Covid-19 immunization for 15 minutes without incidence. She was provided with Vaccine Information Sheet and instruction to access the V-Safe system.   [name] was instructed to call 911 with any severe reactions post vaccine: Marland Kitchen Difficulty breathing  . Swelling of your face and throat  . A fast heartbeat  . A bad rash all over your body  . Dizziness and weakness    Immunizations Administered    Name Date Dose VIS Date Route   Moderna COVID-19 Vaccine 08/12/2019  3:19 PM 0.5 mL 05/18/2019 Intramuscular   Manufacturer: Moderna   [id number]   [place]:[id number]

## 2019-08-19 ENCOUNTER — Telehealth: Payer: Self-pay | Admitting: Family Medicine

## 2019-08-19 NOTE — Telephone Encounter (Signed)
Patient advised per Dr Richardson Landry: Completely normal, just a delayed immune response, means that is is working. Patient verbalized understanding.

## 2019-08-19 NOTE — Telephone Encounter (Signed)
She got her covid shot on 2/25 and has been fine but noticed a red knot that came up today where the shot was and wants to know if that is normal.

## 2019-08-19 NOTE — Telephone Encounter (Signed)
Completely normal, just a delayed immune response, means that is is working

## 2019-09-06 ENCOUNTER — Ambulatory Visit: Payer: BC Managed Care – PPO | Admitting: Family Medicine

## 2019-09-13 ENCOUNTER — Other Ambulatory Visit: Payer: Self-pay | Admitting: Adult Health

## 2019-09-14 ENCOUNTER — Ambulatory Visit: Payer: BC Managed Care – PPO | Attending: Family

## 2019-09-14 DIAGNOSIS — Z23 Encounter for immunization: Secondary | ICD-10-CM

## 2019-09-14 NOTE — Progress Notes (Signed)
   Covid-19 Vaccination Clinic  Name:  [name]    MRN: [id number] DOB: 24-Jul-1980  09/14/2019  [name] was observed post Covid-19 immunization for 15 minutes without incident. She was provided with Vaccine Information Sheet and instruction to access the V-Safe system.   [name] was instructed to call 911 with any severe reactions post vaccine: Marland Kitchen Difficulty breathing  . Swelling of face and throat  . A fast heartbeat  . A bad rash all over body  . Dizziness and weakness   Immunizations Administered    Name Date Dose VIS Date Route   Moderna COVID-19 Vaccine 09/14/2019 11:03 AM 0.5 mL 05/18/2019 Intramuscular   Manufacturer: Moderna   LotFP:[id number]   [place]:[id number]

## 2019-10-06 ENCOUNTER — Encounter: Payer: Self-pay | Admitting: Family Medicine

## 2019-10-06 ENCOUNTER — Ambulatory Visit: Payer: BC Managed Care – PPO | Admitting: Family Medicine

## 2019-10-06 ENCOUNTER — Other Ambulatory Visit: Payer: Self-pay

## 2019-10-06 VITALS — BP 124/84 | HR 115 | Temp 98.3°F | Wt 151.6 lb

## 2019-10-06 DIAGNOSIS — R42 Dizziness and giddiness: Secondary | ICD-10-CM

## 2019-10-06 DIAGNOSIS — I10 Essential (primary) hypertension: Secondary | ICD-10-CM | POA: Diagnosis not present

## 2019-10-06 DIAGNOSIS — G479 Sleep disorder, unspecified: Secondary | ICD-10-CM | POA: Diagnosis not present

## 2019-10-06 DIAGNOSIS — Z79899 Other long term (current) drug therapy: Secondary | ICD-10-CM

## 2019-10-06 DIAGNOSIS — R5383 Other fatigue: Secondary | ICD-10-CM

## 2019-10-06 DIAGNOSIS — E039 Hypothyroidism, unspecified: Secondary | ICD-10-CM

## 2019-10-06 MED ORDER — TRAZODONE HCL 50 MG PO TABS: ORAL_TABLET | ORAL | 0 refills | Status: DC

## 2019-10-06 MED ORDER — LEVOTHYROXINE SODIUM 50 MCG PO TABS: ORAL_TABLET | ORAL | 5 refills | Status: DC

## 2019-10-06 NOTE — Progress Notes (Addendum)
Patient ID: [name], female    DOB: [birth year], 39 y.o.   MRN: [id number]   Chief Complaint  Patient presents with  . Fatigue   Subjective:   HPI Pt stating having fatigue and having insomnia.  Which have been going on for months but progressively worsening. Only getting 3 hrs of sleep at night.  Has been trying a lot of modifications, no TV, phone, or lights at night.  Has seen counselor and therapist.  But laying in bed awake all night.  When taking xanax at night, then only asleep for 1-2 hrs then back awake.  Has had xanax 0.5mg  prn for anxiety for a few yrs.     Has been exercising and drinking more water.  Avoiding caffeine. Not helping much with sleep.  Doesn't feel any new stressors.  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 Having some intermittent dizziness and comes at different times. Does feel at times needs to hold on to some things when happens and resolves on it's own.  No room spinning or vomiting.  No ear pain/ringing or dec hearing.  No URI symptoms. No new meds or vitamins.    Medical History [name] has a past medical history of AMA (advanced maternal age) multigravida 35+ (05/31/2014), Anxiety (02/01/2015), Asthma, Fibroids (08/14/2015), History of kidney stones, Hypertension, Hypothyroid (11/04/2017), Klippel-Feil deformity, Menorrhagia with regular cycle (07/26/2015), PONV (postoperative nausea and vomiting), Pregnant (05/31/2014), Sleep disturbance (07/26/2015), and Spotting (05/31/2014).   Outpatient Encounter Medications as of 10/06/2019  Medication Sig  . albuterol (PROVENTIL HFA;VENTOLIN HFA) 108 (90 Base) MCG/ACT inhaler Inhale 1-2 puffs into the lungs every 6 (six) hours as needed for wheezing or shortness of breath.  . ALPRAZolam (XANAX) 0.5 MG tablet Take 1 tablet (0.5 mg total) by mouth daily as needed for anxiety.  Marland Kitchen levothyroxine (SYNTHROID) 50 MCG tablet TAKE ONE TABLET (50MCG TOTAL) BY MOUTH DAILY BEFORE  BREAKFAST  . lisinopril (ZESTRIL) 5 MG tablet TAKE ONE TABLET (5MG  TOTAL) BY MOUTH DAILY  . [DISCONTINUED] levothyroxine (SYNTHROID) 50 MCG tablet TAKE ONE TABLET (50MCG TOTAL) BY MOUTH DAILY BEFORE BREAKFAST  . traZODone (DESYREL) 50 MG tablet Take 1-2 tab p.o. qhs prn insomnia  . [DISCONTINUED] HYDROcodone-homatropine (HYCODAN) 5-1.5 MG/5ML syrup Take one tsp every 6 hours prn cough   No facility-administered encounter medications on file as of 10/06/2019.     Review of Systems  Constitutional: Positive for fatigue. Negative for chills and fever.  HENT: Negative for congestion, rhinorrhea and sore throat.   Respiratory: Negative for cough, shortness of breath and wheezing.   Cardiovascular: Negative for chest pain and leg swelling.  Gastrointestinal: Negative for abdominal pain, diarrhea, nausea and vomiting.  Genitourinary: Negative for dysuria and frequency.  Musculoskeletal: Negative for arthralgias and back pain.  Skin: Negative for rash.  Neurological: Positive for dizziness. Negative for syncope, weakness and headaches.  Psychiatric/Behavioral: Positive for sleep disturbance. Negative for dysphoric mood and suicidal ideas. The patient is not nervous/anxious.      Vitals BP 124/84   Pulse (!) 115   Temp 98.3 F (36.8 C)   Wt 151 lb 9.6 oz (68.8 kg)   SpO2 99%   BMI 29.61 kg/m   Objective:   Physical Exam Vitals and nursing note reviewed.  Constitutional:      General: She is not in acute distress.    Appearance: Normal appearance. She is not ill-appearing.  HENT:     Head: Normocephalic and atraumatic.  Right Ear: Tympanic membrane and ear canal normal.     Left Ear: Tympanic membrane and ear canal normal.     Nose: Nose normal. No congestion or rhinorrhea.     Mouth/Throat:     Mouth: Mucous membranes are moist.     Pharynx: Oropharynx is clear. No oropharyngeal exudate or posterior oropharyngeal erythema.  Eyes:     Extraocular Movements: Extraocular  movements intact.     Conjunctiva/sclera: Conjunctivae normal.     Pupils: Pupils are equal, round, and reactive to light.  Cardiovascular:     Rate and Rhythm: Regular rhythm. Tachycardia present.     Pulses: Normal pulses.     Heart sounds: Normal heart sounds.  Pulmonary:     Effort: Pulmonary effort is normal.     Breath sounds: Normal breath sounds. No wheezing, rhonchi or rales.  Musculoskeletal:        General: Normal range of motion.     Right lower leg: No edema.     Left lower leg: No edema.  Skin:    General: Skin is warm and dry.     Findings: No lesion or rash.  Neurological:     General: No focal deficit present.     Mental Status: She is alert and oriented to person, place, and time.     Cranial Nerves: No cranial nerve deficit.     Motor: No weakness.     Gait: Gait normal.  Psychiatric:        Behavior: Behavior normal.        Thought Content: Thought content normal.        Judgment: Judgment normal.     Comments: +anxious affect      Assessment and Plan   1. Sleep disturbance - Lipid panel - CBC with Differential/Platelet - Comprehensive Metabolic Panel (CMET) - TSH - T4, free  2. Fatigue, unspecified type - Lipid panel - CBC with Differential/Platelet - Comprehensive Metabolic Panel (CMET) - TSH - T4, free  3. Dizziness - CBC - Lipid panel - CBC with Differential/Platelet - Comprehensive Metabolic Panel (CMET) - TSH - T4, free  4. Essential hypertension - Comprehensive Metabolic Panel (CMET)  5. Hypothyroidism, unspecified type  6. High risk medication use    Advising pt to get labs that were in computer for cmp, lipids, tsh/t4, and cbc.  Review labs.  Trial of trazodone and see if improving sleep.  Continue sleep hygiene and exercising. Xanax prn for anxiety, unable to tolerate SSRI in past.   F/u 3 wks or prn.  Pt in agreement with plan.  I sat down with the patient reviewed over the medical care and talk with the patient  regarding her issues and agree with the plan-Dr. Sallee Lange

## 2019-10-27 ENCOUNTER — Encounter: Payer: Self-pay | Admitting: Family Medicine

## 2019-10-27 MED ORDER — TRAZODONE HCL 100 MG PO TABS: 100.0000 mg | ORAL_TABLET | Freq: Every day | ORAL | 3 refills | Status: DC

## 2019-11-11 MED ORDER — ALPRAZOLAM 1 MG PO TABS: ORAL_TABLET | ORAL | 0 refills | Status: DC

## 2019-11-11 NOTE — Telephone Encounter (Signed)
Nurses Since trazodone may not be helping she could stop the trazodone May increase the Xanax to 1 mg, #30 nightly as needed for sleep This is not a long-term solution Eventually her body will get used to taking the Xanax Therefore we need to address the stress and anxiety better I highly recommend continuing counseling  The fact that she is having ongoing trouble with insomnia and stress is a strong indicator that she should consider being on a daily medicine for generalized anxiety and stress Typically Celexa, Prozac, Zoloft I would recommend patient do a follow-up visit within 30 days

## 2019-11-11 NOTE — Addendum Note (Signed)
Addended by: Vicente Males on: 11/11/2019 10:07 AM   Modules accepted: Orders

## 2019-12-20 ENCOUNTER — Other Ambulatory Visit: Payer: Self-pay | Admitting: Family Medicine

## 2020-01-17 ENCOUNTER — Other Ambulatory Visit: Payer: Self-pay

## 2020-01-17 ENCOUNTER — Ambulatory Visit
Admission: EM | Admit: 2020-01-17 | Discharge: 2020-01-17 | Disposition: A | Discharge status: Home / Self Care | Payer: BC Managed Care – PPO | Attending: Emergency Medicine | Admitting: Emergency Medicine

## 2020-01-17 ENCOUNTER — Encounter: Payer: Self-pay | Admitting: Emergency Medicine

## 2020-01-17 DIAGNOSIS — Z20822 Contact with and (suspected) exposure to covid-19: Secondary | ICD-10-CM

## 2020-01-17 DIAGNOSIS — J069 Acute upper respiratory infection, unspecified: Secondary | ICD-10-CM

## 2020-01-17 DIAGNOSIS — R05 Cough: Secondary | ICD-10-CM

## 2020-01-17 DIAGNOSIS — R059 Cough, unspecified: Secondary | ICD-10-CM

## 2020-01-17 MED ORDER — BENZONATATE 100 MG PO CAPS
100.0000 mg | ORAL_CAPSULE | Freq: Three times a day (TID) | ORAL | 0 refills | Status: DC
Start: 2020-01-17 — End: 2020-03-27

## 2020-01-17 NOTE — ED Triage Notes (Signed)
Pt started feeling bad on Friday with fatigue, sore throat, cough and runny nose.  Pt noticed she does not have any taste.

## 2020-01-17 NOTE — ED Provider Notes (Signed)
[facility]   [id number] 01/17/20 Arrival Time: 4034   CC: COVID symptoms  SUBJECTIVE: History from: patient.  FLORNCE RECORD is a 39 y.o. female who presents with fatigue, sore throat, cough, runny nose, and loss of taste x 3 days.  Admits to sick exposure at work.  Daughter also had COVID exposure at camp.  Has tried OTC medications without relief.  Denies aggravating factors.  Denies previous symptoms in the past.   Denies fever, chills, SOB, wheezing, chest pain, nausea, changes in bowel or bladder habits.    Received both COVID vaccines  ROS: As per HPI.  All other pertinent ROS negative.     Past Medical History:  Diagnosis Date   AMA (advanced maternal age) multigravida 35+ 05/31/2014   Anxiety 02/01/2015   Asthma    Fibroids 08/14/2015   History of kidney stones    Hypertension    history of    Hypothyroid 11/04/2017   Klippel-Feil deformity    syrinx   Menorrhagia with regular cycle 07/26/2015   PONV (postoperative nausea and vomiting)    Pregnant 05/31/2014   Sleep disturbance 07/26/2015   Spotting 05/31/2014   Past Surgical History:  Procedure Laterality Date   CAST APPLICATION Right 12/18/2593   Procedure: CAST APPLICATION;  Surgeon: Carole Civil, MD;  Location: [facility];  Service: Orthopedics;  Laterality: Right;   CESAREAN SECTION N/A 12/27/2014   Procedure: CESAREAN SECTION;  Surgeon: Osborne Oman, MD;  Location: [facility];  Service: Obstetrics;  Laterality: N/A;   CHOLECYSTECTOMY     DILATION AND CURETTAGE OF UTERUS     HYSTEROSCOPY N/A 01/31/2017   Procedure: HYSTEROSCOPY WITH HYDROTHERMAL ABLATION;  Surgeon: Jonnie Kind, MD;  Location: [facility];  Service: Gynecology;  Laterality: N/A;   LAPAROSCOPIC BILATERAL SALPINGECTOMY Bilateral 01/31/2017   Procedure: LAPAROSCOPIC BILATERAL SALPINGECTOMY WITH REMOVAL OF RIGHT OPARATUBAL CYST;  Surgeon: Jonnie Kind, MD;  Location: [facility];  Service: Gynecology;  Laterality:  Bilateral;   LITHOTRIPSY     ORIF ANKLE FRACTURE Right 02/16/2019   Procedure: OPEN REDUCTION INTERNAL FIXATION (ORIF) ANKLE FRACTURE;  Surgeon: Carole Civil, MD;  Location: [facility];  Service: Orthopedics;  Laterality: Right;   Allergies  Allergen Reactions   Biaxin [Clarithromycin] Hives   Cefzil [Cefprozil] Nausea Only   Ciprofloxacin Hives   Hydrocodone Other (See Comments)    Causes patient to feel wired   Morphine And Related    Sulfa Antibiotics Hives   No current facility-administered medications on file prior to encounter.   Current Outpatient Medications on File Prior to Encounter  Medication Sig Dispense Refill   albuterol (PROVENTIL HFA;VENTOLIN HFA) 108 (90 Base) MCG/ACT inhaler Inhale 1-2 puffs into the lungs every 6 (six) hours as needed for wheezing or shortness of breath. 6.7 g 2   ALPRAZolam (XANAX) 1 MG tablet TAKE ONE TABLET BY MOUTH EACH NIGHT AS NEEDED FOR SLEEP 30 tablet 2   levothyroxine (SYNTHROID) 50 MCG tablet TAKE ONE TABLET (50MCG TOTAL) BY MOUTH DAILY BEFORE BREAKFAST 30 tablet 5   lisinopril (ZESTRIL) 5 MG tablet TAKE ONE TABLET (5MG  TOTAL) BY MOUTH DAILY 90 tablet 1   [DISCONTINUED] traZODone (DESYREL) 100 MG tablet Take 1 tablet (100 mg total) by mouth at bedtime. 30 tablet 3   Social History   Socioeconomic History   Marital status: Divorced    Spouse name: Not on file   Number of children: Not on file   Years of education: Not on file  Highest education level: Not on file  Occupational History   Not on file  Tobacco Use   Smoking status: Never Smoker   Smokeless tobacco: Never Used  Vaping Use   Vaping Use: Never used  Substance and Sexual Activity   Alcohol use: Yes    Comment: glass of wine occ   Drug use: No   Sexual activity: Not Currently    Birth control/protection: Surgical, Pill    Comment: tubal and ablation  Other Topics Concern   Not on file  Social History Narrative   Not on file   Social  Determinants of Health   Financial Resource Strain:    Difficulty of Paying Living Expenses:   Food Insecurity:    Worried About Charity fundraiser in the Last Year:    Arboriculturist in the Last Year:   Transportation Needs:    Film/video editor (Medical):    Lack of Transportation (Non-Medical):   Physical Activity:    Days of Exercise per Week:    Minutes of Exercise per Session:   Stress:    Feeling of Stress :   Social Connections:    Frequency of Communication with Friends and Family:    Frequency of Social Gatherings with Friends and Family:    Attends Religious Services:    Active Member of Clubs or Organizations:    Attends Music therapist:    Marital Status:   Intimate Partner Violence:    Fear of Current or Ex-Partner:    Emotionally Abused:    Physically Abused:    Sexually Abused:    Family History  Problem Relation Age of Onset   Diabetes Paternal Grandfather    Cancer Paternal Grandfather        lung   Diabetes Paternal Grandmother    Diabetes Maternal Grandmother    Diabetes Maternal Grandfather    Hypothyroidism Mother     OBJECTIVE:  Vitals:   01/17/20 1418 01/17/20 1421  BP:  118/83  Pulse:  92  Resp:  17  Temp:  98.3 F (36.8 C)  TempSrc:  Oral  SpO2:  96%  Weight: 148 lb (67.1 kg)   Height: 4\' 11"  (1.499 m)      General appearance: alert; mildly fatigued appearing, nontoxic; speaking in full sentences and tolerating own secretions HEENT: NCAT; Ears: EACs clear, TMs pearly gray; Eyes: PERRL.  EOM grossly intact. Nose: nares patent without rhinorrhea, Throat: oropharynx clear, tonsils non erythematous or enlarged, uvula midline  Neck: supple without LAD Lungs: unlabored respirations, symmetrical air entry; cough: absent; no respiratory distress; CTAB Heart: regular rate and rhythm.   Skin: warm and dry Psychological: alert and cooperative; normal mood and affect  ASSESSMENT & PLAN:  1.  Cough   2. Viral URI with cough   3. Suspected COVID-19 virus infection     Meds ordered this encounter  Medications   benzonatate (TESSALON) 100 MG capsule    Sig: Take 1 capsule (100 mg total) by mouth every 8 (eight) hours.    Dispense:  21 capsule    Refill:  0    Order Specific Question:   Supervising Provider    Answer:   Raylene Everts [[id number]]   COVID testing ordered.  It will take between 2-5 days for test results.  Someone will contact you regarding abnormal results.    In the meantime: You should remain isolated in your home for 10 days from symptom onset AND greater than 72  hours after symptoms resolution (absence of fever without the use of fever-reducing medication and improvement in respiratory symptoms), whichever is longer Get plenty of rest and push fluids Tessalon Perles prescribed for cough Use OTC zyrtec for nasal congestion, runny nose, and/or sore throat Use OTC flonase for nasal congestion and runny nose Use medications daily for symptom relief Use OTC medications like ibuprofen or tylenol as needed fever or pain Call or go to the ED if you have any new or worsening symptoms such as fever, worsening cough, shortness of breath, chest tightness, chest pain, turning blue, changes in mental status, etc...   Reviewed expectations re: course of current medical issues. Questions answered. Outlined signs and symptoms indicating need for more acute intervention. Patient verbalized understanding. After Visit Summary given.         Lestine Box, PA-C 01/17/20 1443

## 2020-01-17 NOTE — Discharge Instructions (Signed)

## 2020-01-18 LAB — SARS-COV-2, NAA 2 DAY TAT

## 2020-01-18 LAB — NOVEL CORONAVIRUS, NAA: SARS-CoV-2, NAA: NOT DETECTED

## 2020-01-19 ENCOUNTER — Other Ambulatory Visit: Payer: Self-pay

## 2020-01-19 ENCOUNTER — Ambulatory Visit (INDEPENDENT_AMBULATORY_CARE_PROVIDER_SITE_OTHER): Payer: BC Managed Care – PPO | Admitting: Family Medicine

## 2020-01-19 VITALS — Temp 97.7°F

## 2020-01-19 DIAGNOSIS — Z20822 Contact with and (suspected) exposure to covid-19: Secondary | ICD-10-CM

## 2020-01-19 DIAGNOSIS — J019 Acute sinusitis, unspecified: Secondary | ICD-10-CM | POA: Diagnosis not present

## 2020-01-19 MED ORDER — DOXYCYCLINE HYCLATE 100 MG PO TABS: 100.0000 mg | ORAL_TABLET | Freq: Two times a day (BID) | ORAL | 0 refills | Status: DC

## 2020-01-19 NOTE — Progress Notes (Signed)
   Subjective:    Patient ID: [name], female    DOB: 1981-01-20, 39 y.o.   MRN: [id number]  Sore Throat  This is a new problem. Episode onset: Last friday. Associated symptoms include coughing and headaches. Associated symptoms comments: Burning in her chest, fatigue, no sense of smell or taste. Symptoms are worsening.. She has tried NSAIDs and acetaminophen for the symptoms. The treatment provided no relief (Had covid test last week and it was negative. ).    She did have a PCR Covid test which was negative she has a lot of fatigue tiredness feeling rundown body aches head congestion sinus pressure pain she is lost her sense of taste and smell  Review of Systems  Respiratory: Positive for cough.   Neurological: Positive for headaches.       Objective:   Physical Exam Lungs are clear respiratory rate normal heart is regular ears normal moderate sinus tenderness bilateral  Patient does not appear toxic but she does look like she feels fatigued     Assessment & Plan:  Covid by definition Warning signs were discussed in detail O2 saturation good No respiratory distress no sign of any abnormal lung exam Hold off on any x-rays ER Stay home over the next 7 days that would be 10 days total Work excuse given Antibiotics for sinus infection was given Follow-up if progressive troubles or worse No need to repeat Covid

## 2020-02-16 ENCOUNTER — Ambulatory Visit: Payer: BC Managed Care – PPO | Admitting: Orthopedic Surgery

## 2020-03-21 ENCOUNTER — Other Ambulatory Visit: Payer: Self-pay | Admitting: Family Medicine

## 2020-03-23 ENCOUNTER — Other Ambulatory Visit: Payer: Self-pay | Admitting: Family Medicine

## 2020-03-23 DIAGNOSIS — Z79899 Other long term (current) drug therapy: Secondary | ICD-10-CM

## 2020-03-23 DIAGNOSIS — R5383 Other fatigue: Secondary | ICD-10-CM

## 2020-03-23 DIAGNOSIS — Z13220 Encounter for screening for lipoid disorders: Secondary | ICD-10-CM

## 2020-03-23 DIAGNOSIS — E039 Hypothyroidism, unspecified: Secondary | ICD-10-CM

## 2020-03-24 NOTE — Telephone Encounter (Signed)
Needs labs and ov Rec lipid met 7 tsh free t4 Notify pt and i'll do 1 refill 

## 2020-03-27 ENCOUNTER — Encounter: Payer: Self-pay | Admitting: Family Medicine

## 2020-03-27 ENCOUNTER — Ambulatory Visit (INDEPENDENT_AMBULATORY_CARE_PROVIDER_SITE_OTHER): Payer: BC Managed Care – PPO | Admitting: Family Medicine

## 2020-03-27 ENCOUNTER — Other Ambulatory Visit: Payer: Self-pay

## 2020-03-27 VITALS — BP 124/72 | HR 97 | Temp 97.4°F | Wt 147.6 lb

## 2020-03-27 DIAGNOSIS — G479 Sleep disorder, unspecified: Secondary | ICD-10-CM | POA: Diagnosis not present

## 2020-03-27 DIAGNOSIS — Z79899 Other long term (current) drug therapy: Secondary | ICD-10-CM | POA: Insufficient documentation

## 2020-03-27 MED ORDER — ALPRAZOLAM 1 MG PO TABS: 1.0000 mg | ORAL_TABLET | Freq: Every day | ORAL | 0 refills | Status: DC

## 2020-03-27 NOTE — Progress Notes (Signed)
Patient ID: [name], female    DOB: December 02, 1980, 39 y.o.   MRN: [id number]   Chief Complaint  Patient presents with  . Follow-up    Patient comes in today for refill on medications. Patient requesting refill on xanax. Patient reports improvement in sleeping, as long as she takes 1 tablet she can sleep all night.    Subjective:  CC: follow-up for medication for sleep  HPI Takes Xanax most nights for sleep.  Misunderstood that she should get her labs drawn before this appointment today.  She has not had labs drawn since December 2020.  Dr. Sallee Lange ordered labs on Friday for her to have done before this follow-up appointment, she will stop on the way out today to have those labs which will be routed to his inbox.  She needs a refill on her Xanax.  Medical History [place] has a past medical history of AMA (advanced maternal age) multigravida 35+ (05/31/2014), Anxiety (02/01/2015), Asthma, Fibroids (08/14/2015), History of kidney stones, Hypertension, Hypothyroid (11/04/2017), Klippel-Feil deformity, Menorrhagia with regular cycle (07/26/2015), PONV (postoperative nausea and vomiting), Pregnant (05/31/2014), Sleep disturbance (07/26/2015), and Spotting (05/31/2014).   Outpatient Encounter Medications as of 03/27/2020  Medication Sig  . albuterol (PROVENTIL HFA;VENTOLIN HFA) 108 (90 Base) MCG/ACT inhaler Inhale 1-2 puffs into the lungs every 6 (six) hours as needed for wheezing or shortness of breath.  . ALPRAZolam (XANAX) 1 MG tablet Take 1 tablet (1 mg total) by mouth at bedtime.  Marland Kitchen levothyroxine (SYNTHROID) 50 MCG tablet TAKE ONE TABLET (50MCG TOTAL) BY MOUTH DAILY BEFORE BREAKFAST  . lisinopril (ZESTRIL) 5 MG tablet TAKE ONE TABLET (5MG  TOTAL) BY MOUTH DAILY  . [DISCONTINUED] ALPRAZolam (XANAX) 1 MG tablet TAKE ONE TABLET BY MOUTH EACH NIGHT AS NEEDED FOR SLEEP  . [DISCONTINUED] benzonatate (TESSALON) 100 MG capsule Take 1 capsule (100 mg total) by mouth every 8 (eight) hours.  (Patient not taking: Reported on 01/19/2020)  . [DISCONTINUED] doxycycline (VIBRA-TABS) 100 MG tablet Take 1 tablet (100 mg total) by mouth 2 (two) times daily.  . [DISCONTINUED] traZODone (DESYREL) 100 MG tablet Take 1 tablet (100 mg total) by mouth at bedtime.   No facility-administered encounter medications on file as of 03/27/2020.     Review of Systems  Constitutional: Negative.   HENT: Negative.   Eyes: Negative.   Respiratory: Negative.   Cardiovascular: Negative.   Gastrointestinal: Negative.   Genitourinary: Negative.   Musculoskeletal: Negative.   Neurological: Negative.   Hematological: Negative.   Psychiatric/Behavioral: Negative.      Vitals BP 124/72   Pulse 97   Temp (!) 97.4 F (36.3 C)   Wt 147 lb 9.6 oz (67 kg)   SpO2 99%   BMI 29.81 kg/m   Objective:   Physical Exam Vitals and nursing note reviewed.  Constitutional:      Appearance: Normal appearance.  Cardiovascular:     Rate and Rhythm: Normal rate and regular rhythm.     Heart sounds: Normal heart sounds.  Pulmonary:     Effort: Pulmonary effort is normal.     Breath sounds: Normal breath sounds.  Abdominal:     General: Bowel sounds are normal.     Palpations: Abdomen is soft.  Skin:    General: Skin is warm and dry.  Neurological:     Mental Status: She is alert and oriented to person, place, and time.  Psychiatric:        Mood and Affect: Mood normal.  Behavior: Behavior normal.        Thought Content: Thought content normal.        Judgment: Judgment normal.     Comments: PHQ-9: 1 GAD: 0      Assessment and Plan   1. High risk medication use  2. Sleep disorder - ALPRAZolam (XANAX) 1 MG tablet; Take 1 tablet (1 mg total) by mouth at bedtime.  Dispense: 7 tablet; Refill: 0   Did not understand to get labs before follow-up visit. Has not had labs since Dec. 2020. Will stop on the way out and get labs. Xanax 1 mg:  7 day supply sent for today. Lab results will go to Dr.  Sallee Lange for review and he will reorder routine Xanax at that time.  She will follow-up in 3 weeks for annual exam and will review labs at that time as well.  Blood pressure is good today. No concerns voiced.   Information given on DASH diet today.   Agrees with plan of care discussed today. Understands warning signs to seek further care: Any changes in her health status. Understands to follow-up in 3 weeks for annual physical, sooner if anything changes.  Dr. Sallee Lange will review labs and refill routine Xanax at that time.

## 2020-03-27 NOTE — Telephone Encounter (Signed)
Patient has appointment today for follow up.

## 2020-03-27 NOTE — Patient Instructions (Signed)
DASH Eating Plan DASH stands for "Dietary Approaches to Stop Hypertension." The DASH eating plan is a healthy eating plan that has been shown to reduce high blood pressure (hypertension). It may also reduce your risk for type 2 diabetes, heart disease, and stroke. The DASH eating plan may also help with weight loss. What are tips for following this plan?  General guidelines  Avoid eating more than 2,300 mg (milligrams) of salt (sodium) a day. If you have hypertension, you may need to reduce your sodium intake to 1,500 mg a day.  Limit alcohol intake to no more than 1 drink a day for nonpregnant women and 2 drinks a day for men. One drink equals 12 oz of beer, 5 oz of wine, or 1 oz of hard liquor.  Work with your health care provider to maintain a healthy body weight or to lose weight. Ask what an ideal weight is for you.  Get at least 30 minutes of exercise that causes your heart to beat faster (aerobic exercise) most days of the week. Activities may include walking, swimming, or biking.  Work with your health care provider or diet and nutrition specialist (dietitian) to adjust your eating plan to your individual calorie needs. Reading food labels   Check food labels for the amount of sodium per serving. Choose foods with less than 5 percent of the Daily Value of sodium. Generally, foods with less than 300 mg of sodium per serving fit into this eating plan.  To find whole grains, look for the word "whole" as the first word in the ingredient list. Shopping  Buy products labeled as "low-sodium" or "no salt added."  Buy fresh foods. Avoid canned foods and premade or frozen meals. Cooking  Avoid adding salt when cooking. Use salt-free seasonings or herbs instead of table salt or sea salt. Check with your health care provider or pharmacist before using salt substitutes.  Do not fry foods. Cook foods using healthy methods such as baking, boiling, grilling, and broiling instead.  Cook with  heart-healthy oils, such as olive, canola, soybean, or sunflower oil. Meal planning  Eat a balanced diet that includes: ? 5 or more servings of fruits and vegetables each day. At each meal, try to fill half of your plate with fruits and vegetables. ? Up to 6-8 servings of whole grains each day. ? Less than 6 oz of lean meat, poultry, or fish each day. A 3-oz serving of meat is about the same size as a deck of cards. One egg equals 1 oz. ? 2 servings of low-fat dairy each day. ? A serving of nuts, seeds, or beans 5 times each week. ? Heart-healthy fats. Healthy fats called Omega-3 fatty acids are found in foods such as flaxseeds and coldwater fish, like sardines, salmon, and mackerel.  Limit how much you eat of the following: ? Canned or prepackaged foods. ? Food that is high in trans fat, such as fried foods. ? Food that is high in saturated fat, such as fatty meat. ? Sweets, desserts, sugary drinks, and other foods with added sugar. ? Full-fat dairy products.  Do not salt foods before eating.  Try to eat at least 2 vegetarian meals each week.  Eat more home-cooked food and less restaurant, buffet, and fast food.  When eating at a restaurant, ask that your food be prepared with less salt or no salt, if possible. What foods are recommended? The items listed may not be a complete list. Talk with your dietitian about   what dietary choices are best for you. Grains Whole-grain or whole-wheat bread. Whole-grain or whole-wheat pasta. Brown rice. Oatmeal. Quinoa. Bulgur. Whole-grain and low-sodium cereals. Pita bread. Low-fat, low-sodium crackers. Whole-wheat flour tortillas. Vegetables Fresh or frozen vegetables (raw, steamed, roasted, or grilled). Low-sodium or reduced-sodium tomato and vegetable juice. Low-sodium or reduced-sodium tomato sauce and tomato paste. Low-sodium or reduced-sodium canned vegetables. Fruits All fresh, dried, or frozen fruit. Canned fruit in natural juice (without  added sugar). Meat and other protein foods Skinless chicken or turkey. Ground chicken or turkey. Pork with fat trimmed off. Fish and seafood. Egg whites. Dried beans, peas, or lentils. Unsalted nuts, nut butters, and seeds. Unsalted canned beans. Lean cuts of beef with fat trimmed off. Low-sodium, lean deli meat. Dairy Low-fat (1%) or fat-free (skim) milk. Fat-free, low-fat, or reduced-fat cheeses. Nonfat, low-sodium ricotta or cottage cheese. Low-fat or nonfat yogurt. Low-fat, low-sodium cheese. Fats and oils Soft margarine without trans fats. Vegetable oil. Low-fat, reduced-fat, or light mayonnaise and salad dressings (reduced-sodium). Canola, safflower, olive, soybean, and sunflower oils. Avocado. Seasoning and other foods Herbs. Spices. Seasoning mixes without salt. Unsalted popcorn and pretzels. Fat-free sweets. What foods are not recommended? The items listed may not be a complete list. Talk with your dietitian about what dietary choices are best for you. Grains Baked goods made with fat, such as croissants, muffins, or some breads. Dry pasta or rice meal packs. Vegetables Creamed or fried vegetables. Vegetables in a cheese sauce. Regular canned vegetables (not low-sodium or reduced-sodium). Regular canned tomato sauce and paste (not low-sodium or reduced-sodium). Regular tomato and vegetable juice (not low-sodium or reduced-sodium). Pickles. Olives. Fruits Canned fruit in a light or heavy syrup. Fried fruit. Fruit in cream or butter sauce. Meat and other protein foods Fatty cuts of meat. Ribs. Fried meat. Bacon. Sausage. Bologna and other processed lunch meats. Salami. Fatback. Hotdogs. Bratwurst. Salted nuts and seeds. Canned beans with added salt. Canned or smoked fish. Whole eggs or egg yolks. Chicken or turkey with skin. Dairy Whole or 2% milk, cream, and half-and-half. Whole or full-fat cream cheese. Whole-fat or sweetened yogurt. Full-fat cheese. Nondairy creamers. Whipped toppings.  Processed cheese and cheese spreads. Fats and oils Butter. Stick margarine. Lard. Shortening. Ghee. Bacon fat. Tropical oils, such as coconut, palm kernel, or palm oil. Seasoning and other foods Salted popcorn and pretzels. Onion salt, garlic salt, seasoned salt, table salt, and sea salt. Worcestershire sauce. Tartar sauce. Barbecue sauce. Teriyaki sauce. Soy sauce, including reduced-sodium. Steak sauce. Canned and packaged gravies. Fish sauce. Oyster sauce. Cocktail sauce. Horseradish that you find on the shelf. Ketchup. Mustard. Meat flavorings and tenderizers. Bouillon cubes. Hot sauce and Tabasco sauce. Premade or packaged marinades. Premade or packaged taco seasonings. Relishes. Regular salad dressings. Where to find more information:  National Heart, Lung, and Blood Institute: www.nhlbi.nih.gov  American Heart Association: www.heart.org Summary  The DASH eating plan is a healthy eating plan that has been shown to reduce high blood pressure (hypertension). It may also reduce your risk for type 2 diabetes, heart disease, and stroke.  With the DASH eating plan, you should limit salt (sodium) intake to 2,300 mg a day. If you have hypertension, you may need to reduce your sodium intake to 1,500 mg a day.  When on the DASH eating plan, aim to eat more fresh fruits and vegetables, whole grains, lean proteins, low-fat dairy, and heart-healthy fats.  Work with your health care provider or diet and nutrition specialist (dietitian) to adjust your eating plan to your   individual calorie needs. This information is not intended to replace advice given to you by your health care provider. Make sure you discuss any questions you have with your health care provider. Document Revised: 05/16/2017 Document Reviewed: 05/27/2016 Elsevier Patient Education  2020 [employer]  

## 2020-03-28 ENCOUNTER — Other Ambulatory Visit: Payer: Self-pay | Admitting: Family Medicine

## 2020-03-28 DIAGNOSIS — G479 Sleep disorder, unspecified: Secondary | ICD-10-CM

## 2020-03-28 LAB — CBC WITH DIFFERENTIAL/PLATELET
Basophils Absolute: 0.1 10*3/uL (ref 0.0–0.2)
Basos: 1 %
EOS (ABSOLUTE): 0.1 10*3/uL (ref 0.0–0.4)
Eos: 1 %
Hematocrit: 38.9 % (ref 34.0–46.6)
Hemoglobin: 13 g/dL (ref 11.1–15.9)
Immature Grans (Abs): 0 10*3/uL (ref 0.0–0.1)
Immature Granulocytes: 0 %
Lymphocytes Absolute: 1.8 10*3/uL (ref 0.7–3.1)
Lymphs: 33 %
MCH: 30.8 pg (ref 26.6–33.0)
MCHC: 33.4 g/dL (ref 31.5–35.7)
MCV: 92 fL (ref 79–97)
Monocytes Absolute: 0.6 10*3/uL (ref 0.1–0.9)
Monocytes: 10 %
Neutrophils Absolute: 3 10*3/uL (ref 1.4–7.0)
Neutrophils: 55 %
Platelets: 395 10*3/uL (ref 150–450)
RBC: 4.22 x10E6/uL (ref 3.77–5.28)
RDW: 12.7 % (ref 11.7–15.4)
WBC: 5.5 10*3/uL (ref 3.4–10.8)

## 2020-03-28 LAB — COMPREHENSIVE METABOLIC PANEL
ALT: 19 IU/L (ref 0–32)
AST: 19 IU/L (ref 0–40)
Albumin/Globulin Ratio: 1.5 (ref 1.2–2.2)
Albumin: 4.5 g/dL (ref 3.8–4.8)
Alkaline Phosphatase: 72 IU/L (ref 44–121)
BUN/Creatinine Ratio: 12 (ref 9–23)
BUN: 8 mg/dL (ref 6–24)
Bilirubin Total: 0.3 mg/dL (ref 0.0–1.2)
CO2: 24 mmol/L (ref 20–29)
Calcium: 9.5 mg/dL (ref 8.7–10.2)
Chloride: 102 mmol/L (ref 96–106)
Creatinine, Ser: 0.69 mg/dL (ref 0.57–1.00)
GFR calc Af Amer: 126 mL/min/{1.73_m2} (ref >59)
GFR calc non Af Amer: 109 mL/min/{1.73_m2} (ref >59)
Globulin, Total: 3 g/dL (ref 1.5–4.5)
Glucose: 89 mg/dL (ref 65–99)
Potassium: 4.3 mmol/L (ref 3.5–5.2)
Sodium: 140 mmol/L (ref 134–144)
Total Protein: 7.5 g/dL (ref 6.0–8.5)

## 2020-03-28 LAB — LIPID PANEL
Chol/HDL Ratio: 5.6 ratio — ABNORMAL HIGH (ref 0.0–4.4)
Cholesterol, Total: 254 mg/dL — ABNORMAL HIGH (ref 100–199)
HDL: 45 mg/dL (ref >39)
LDL Chol Calc (NIH): 172 mg/dL — ABNORMAL HIGH (ref 0–99)
Triglycerides: 199 mg/dL — ABNORMAL HIGH (ref 0–149)
VLDL Cholesterol Cal: 37 mg/dL (ref 5–40)

## 2020-03-28 LAB — T4, FREE: Free T4: 1.05 ng/dL (ref 0.82–1.77)

## 2020-03-28 LAB — TSH: TSH: 3.49 u[IU]/mL (ref 0.450–4.500)

## 2020-03-28 MED ORDER — ALPRAZOLAM 1 MG PO TABS: 1.0000 mg | ORAL_TABLET | Freq: Every day | ORAL | 4 refills | Status: DC

## 2020-04-04 ENCOUNTER — Ambulatory Visit (INDEPENDENT_AMBULATORY_CARE_PROVIDER_SITE_OTHER): Payer: BC Managed Care – PPO | Admitting: Family Medicine

## 2020-04-04 ENCOUNTER — Encounter: Payer: Self-pay | Admitting: Family Medicine

## 2020-04-04 ENCOUNTER — Other Ambulatory Visit: Payer: Self-pay

## 2020-04-04 ENCOUNTER — Ambulatory Visit: Payer: BC Managed Care – PPO | Admitting: Family Medicine

## 2020-04-04 VITALS — HR 76 | Temp 98.4°F | Resp 16

## 2020-04-04 DIAGNOSIS — H6591 Unspecified nonsuppurative otitis media, right ear: Secondary | ICD-10-CM

## 2020-04-04 DIAGNOSIS — R059 Cough, unspecified: Secondary | ICD-10-CM

## 2020-04-04 DIAGNOSIS — J029 Acute pharyngitis, unspecified: Secondary | ICD-10-CM | POA: Insufficient documentation

## 2020-04-04 LAB — POCT RAPID STREP A (OFFICE): Rapid Strep A Screen: NEGATIVE

## 2020-04-04 MED ORDER — GUAIFENESIN-CODEINE 100-10 MG/5ML PO SOLN: 5.0000 mL | Freq: Three times a day (TID) | ORAL | 0 refills | Status: DC | PRN

## 2020-04-04 MED ORDER — AMOXICILLIN-POT CLAVULANATE 875-125 MG PO TABS: 1.0000 | ORAL_TABLET | Freq: Two times a day (BID) | ORAL | 0 refills | Status: DC

## 2020-04-04 MED ORDER — PREDNISONE 20 MG PO TABS: ORAL_TABLET | ORAL | 0 refills | Status: DC

## 2020-04-04 NOTE — Progress Notes (Signed)
Patient ID: [name], female    DOB: February 05, 1981, 39 y.o.   MRN: [id number]   Chief Complaint  Patient presents with  . Cough   Subjective:  CC: sore throat and tickle cough  On Saturday night [name] started experiencing sore throat with a tickle cough.  Cough has been waking her up at night she has tried Mucinex during the day.  She describes the cough as "heavy "cough with sputum.  She is experiencing some achiness in her neck shoulder and chest.  She did have a moderna Covid vaccine (not recently).  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  Cough This is a new problem. Episode onset: Saturday. Associated symptoms include chest pain, headaches and a sore throat. Pertinent negatives include no chills or fever. Associated symptoms comments: Back pain Body aches . Treatments tried: mucinex, ibuprofen.     Medical History [name] has a past medical history of AMA (advanced maternal age) multigravida 35+ (05/31/2014), Anxiety (02/01/2015), Asthma, Fibroids (08/14/2015), History of kidney stones, Hypertension, Hypothyroid (11/04/2017), Klippel-Feil deformity, Menorrhagia with regular cycle (07/26/2015), PONV (postoperative nausea and vomiting), Pregnant (05/31/2014), Sleep disturbance (07/26/2015), and Spotting (05/31/2014).   Outpatient Encounter Medications as of 04/04/2020  Medication Sig  . albuterol (PROVENTIL HFA;VENTOLIN HFA) 108 (90 Base) MCG/ACT inhaler Inhale 1-2 puffs into the lungs every 6 (six) hours as needed for wheezing or shortness of breath.  . ALPRAZolam (XANAX) 1 MG tablet Take 1 tablet (1 mg total) by mouth at bedtime.  Marland Kitchen amoxicillin-clavulanate (AUGMENTIN) 875-125 MG tablet Take 1 tablet by mouth 2 (two) times daily.  Marland Kitchen guaiFENesin-codeine 100-10 MG/5ML syrup  Take 5 mLs by mouth 3 (three) times daily as needed for cough.  . levothyroxine (SYNTHROID) 50 MCG tablet TAKE ONE TABLET (50MCG TOTAL) BY MOUTH DAILY BEFORE BREAKFAST  . lisinopril (ZESTRIL) 5 MG tablet TAKE ONE TABLET (5MG  TOTAL) BY MOUTH DAILY  . predniSONE (DELTASONE) 20 MG tablet Three tablets for 3 days, then 2 tablets for 3 days, then 1 tablet for 3 days.  . [DISCONTINUED] traZODone (DESYREL) 100 MG tablet Take 1 tablet (100 mg total) by mouth at bedtime.   No facility-administered encounter medications on file as of 04/04/2020.     Review of Systems  Constitutional: Negative for chills and fever.  HENT: Positive for sore throat.   Respiratory: Positive for cough.   Cardiovascular: Positive for chest pain.       With inhalation.  Neurological: Positive for headaches.  Hematological: Positive for adenopathy.     Vitals Pulse 76   Temp 98.4 F (36.9 C)   Resp 16   SpO2 98%   Objective:   Physical Exam Vitals reviewed.  Constitutional:      General: She is not in acute distress.    Appearance: Normal appearance. She is not ill-appearing or toxic-appearing.  HENT:     Right Ear: Tympanic membrane is erythematous and bulging.     Left Ear: Tympanic membrane normal.     Nose: Nose normal.     Mouth/Throat:     Mouth: Mucous membranes are moist.     Pharynx: Oropharynx is clear. Posterior oropharyngeal erythema present. No oropharyngeal exudate.  Cardiovascular:     Rate and Rhythm: Normal rate and regular rhythm.     Heart sounds: Normal  heart sounds.  Pulmonary:     Effort: Pulmonary effort is normal.     Breath sounds: Normal breath sounds.  Skin:    General: Skin is warm and dry.  Neurological:     Mental Status: She is alert and oriented to person, place, and time.  Psychiatric:        Mood and Affect: Mood normal.        Behavior: Behavior normal.        Thought Content: Thought content normal.        Judgment: Judgment normal.      Assessment and  Plan   1. Sore throat - Novel Coronavirus, NAA ([facility]) - POCT rapid strep A  2. Right non-suppurative otitis media - amoxicillin-clavulanate (AUGMENTIN) 875-125 MG tablet; Take 1 tablet by mouth 2 (two) times daily.  Dispense: 20 tablet; Refill: 0  3. Cough in adult - predniSONE (DELTASONE) 20 MG tablet; Three tablets for 3 days, then 2 tablets for 3 days, then 1 tablet for 3 days.  Dispense: 18 tablet; Refill: 0 - guaiFENesin-codeine 100-10 MG/5ML syrup; Take 5 mLs by mouth 3 (three) times daily as needed for cough.  Dispense: 120 mL; Refill: 0   [name] is an high risk work environment, she is a Radio producer.  Symptoms started 3 days ago with sore throat and a tickle cough, the cough is keeping her up at night.  She has tried Mucinex during the day with minimal relief.  She describes  the cough as heavy with sputum production and feels achy in her neck shoulders and chest area.  She has not experienced fever.  She reports that her coteacher was positive for Covid, they always wear a mask when around each other.  We will test her for Covid today just to be sure.  Due to sore throat, I tested her for strep, rapid was negative.  Will send for culture.  Plan: Treat right otitis media with Augmentin. Treat cough and heaviness, will treat with prednisone taper, and guaifenesin with codeine for nighttime relief.  Agrees with plan of care discussed today. Understands warning signs to seek further care: Fever, chest pain, shortness of breath, any significant changes in health status. Understands to follow-up if symptoms do not improve.  Cindy Ades, FNP-C

## 2020-04-06 LAB — SARS-COV-2, NAA 2 DAY TAT

## 2020-04-06 LAB — NOVEL CORONAVIRUS, NAA: SARS-CoV-2, NAA: NOT DETECTED

## 2020-04-07 LAB — CULTURE, GROUP A STREP: Strep A Culture: NEGATIVE

## 2020-04-27 ENCOUNTER — Ambulatory Visit: Payer: BC Managed Care – PPO | Admitting: Family Medicine

## 2020-05-16 ENCOUNTER — Encounter: Payer: Self-pay | Admitting: Family Medicine

## 2020-07-28 ENCOUNTER — Other Ambulatory Visit: Payer: Self-pay | Admitting: Family Medicine

## 2020-08-24 ENCOUNTER — Other Ambulatory Visit: Payer: Self-pay | Admitting: Family Medicine

## 2020-08-24 DIAGNOSIS — G479 Sleep disorder, unspecified: Secondary | ICD-10-CM

## 2020-08-24 NOTE — Telephone Encounter (Signed)
Sent my chart message to schedule appointment.

## 2020-08-24 NOTE — Telephone Encounter (Signed)
Med Check appt made 04/07 out of meds this coming Saturday ALPRAZolam (XANAX) 1 MG tablet [name], [place] [facility] [place]

## 2020-08-25 ENCOUNTER — Encounter: Payer: Self-pay | Admitting: Family Medicine

## 2020-08-25 ENCOUNTER — Other Ambulatory Visit: Payer: Self-pay | Admitting: Family Medicine

## 2020-08-25 DIAGNOSIS — G479 Sleep disorder, unspecified: Secondary | ICD-10-CM

## 2020-09-11 ENCOUNTER — Other Ambulatory Visit: Payer: Self-pay | Admitting: Family Medicine

## 2020-09-11 DIAGNOSIS — E039 Hypothyroidism, unspecified: Secondary | ICD-10-CM

## 2020-09-11 MED ORDER — LEVOTHYROXINE SODIUM 50 MCG PO TABS: 50.0000 ug | ORAL_TABLET | Freq: Every day | ORAL | 2 refills | Status: DC

## 2020-09-12 ENCOUNTER — Ambulatory Visit: Payer: BC Managed Care – PPO | Admitting: Adult Health

## 2020-09-12 ENCOUNTER — Other Ambulatory Visit (HOSPITAL_COMMUNITY)
Admission: RE | Admit: 2020-09-12 | Discharge: 2020-09-12 | Disposition: A | Discharge status: Home / Self Care | Payer: BC Managed Care – PPO | Source: Ambulatory Visit | Attending: Adult Health | Admitting: Adult Health

## 2020-09-12 ENCOUNTER — Encounter: Payer: Self-pay | Admitting: Adult Health

## 2020-09-12 ENCOUNTER — Other Ambulatory Visit: Payer: Self-pay

## 2020-09-12 VITALS — BP 129/97 | HR 95 | Ht 59.0 in | Wt 158.0 lb

## 2020-09-12 DIAGNOSIS — N946 Dysmenorrhea, unspecified: Secondary | ICD-10-CM | POA: Diagnosis not present

## 2020-09-12 DIAGNOSIS — N898 Other specified noninflammatory disorders of vagina: Secondary | ICD-10-CM

## 2020-09-12 DIAGNOSIS — Z01419 Encounter for gynecological examination (general) (routine) without abnormal findings: Secondary | ICD-10-CM

## 2020-09-12 DIAGNOSIS — N920 Excessive and frequent menstruation with regular cycle: Secondary | ICD-10-CM

## 2020-09-12 DIAGNOSIS — Z1231 Encounter for screening mammogram for malignant neoplasm of breast: Secondary | ICD-10-CM

## 2020-09-12 NOTE — Progress Notes (Signed)
  Subjective:     Patient ID: [name], female   DOB: 1980-08-01, 40 y.o.   MRN: [id number]  HPI [name] is a 40 year old white female, divorced, with DP, [id number], in complaining of heavy periods for 3 months with clots and pain and vagina feels irritated.Has noticed odor and ?bulge, has used monistat, and tampons hurt. Has dizziness at times. Las pap was ASCUS, negative HPV 06/29/2014.  PCP is Dr Sallee Lange.  Review of Systems Not having sex See HPI for positives Reviewed past medical,surgical, social and family history. Reviewed medications and allergies.     Objective:   Physical Exam BP (!) 129/97 (BP Location: Left Arm, Patient Position: Sitting, Cuff Size: Normal)   Pulse 95   Ht 4\' 11"  (1.499 m)   Wt 158 lb (71.7 kg)   LMP 09/09/2020   BMI 31.91 kg/m   Skin warm and dry. Neck: mid line trachea, normal thyroid, good ROM, no lymphadenopathy noted. Lungs: clear to ausculation bilaterally. Cardiovascular: regular rate and rhythm. Pelvic: external genitalia is normal in appearance no lesions, vagina: dark brown to black period blood without odor,urethra has no lesions or masses noted, cervix:smooth, pap with GC/CHL and HR HPV genotyping performed,uterus: normal size, shape and contour,mildly tender, no masses felt, adnexa: no masses, +RLQ tenderness noted. Bladder is non tender and no masses felt. Fall risk is low  Upstream - 09/12/20 0851      Pregnancy Intention Screening   Does the patient want to become pregnant in the next year? No    Does the patient's partner want to become pregnant in the next year? No    Would the patient like to discuss contraceptive options today? No      Contraception Wrap Up   Current Method Female Sterilization    End Method Female Sterilization    Contraception Counseling Provided No         Co exam with Tinnie Gens NP student .     Assessment:     1. Menorrhagia with regular cycle Will get pelvic US in 2 days   2.  Dysmenorrhea  3. Vaginal irritation  4. Encounter for cervical Pap smear with pelvic exam Pap sent for GC/CHL and HR HPV genotyping   5. Screening mammogram for breast cancer Pt to call for mammogram appt at [facility]:     Return in 2 days for GYN [place], will talk when results back

## 2020-09-12 NOTE — Addendum Note (Signed)
Addended by: Linton Rump on: 09/12/2020 09:57 AM   Modules accepted: Orders

## 2020-09-14 ENCOUNTER — Other Ambulatory Visit: Payer: Self-pay

## 2020-09-14 ENCOUNTER — Ambulatory Visit (INDEPENDENT_AMBULATORY_CARE_PROVIDER_SITE_OTHER): Payer: BC Managed Care – PPO

## 2020-09-14 DIAGNOSIS — N946 Dysmenorrhea, unspecified: Secondary | ICD-10-CM | POA: Diagnosis not present

## 2020-09-14 DIAGNOSIS — N920 Excessive and frequent menstruation with regular cycle: Secondary | ICD-10-CM

## 2020-09-14 NOTE — Progress Notes (Signed)
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

## 2020-09-15 LAB — CYTOLOGY - PAP
Chlamydia: NEGATIVE
Comment: NEGATIVE
Comment: NEGATIVE
Comment: NORMAL
Diagnosis: NEGATIVE
High risk HPV: NEGATIVE
Neisseria Gonorrhea: NEGATIVE

## 2020-09-18 ENCOUNTER — Other Ambulatory Visit: Payer: Self-pay | Admitting: Adult Health

## 2020-09-18 ENCOUNTER — Ambulatory Visit: Payer: BC Managed Care – PPO | Admitting: Family Medicine

## 2020-09-18 ENCOUNTER — Encounter: Payer: Self-pay | Admitting: Adult Health

## 2020-09-18 DIAGNOSIS — Q513 Bicornate uterus: Secondary | ICD-10-CM

## 2020-09-18 HISTORY — DX: Bicornate uterus: Q51.3

## 2020-09-18 MED ORDER — NORETHINDRONE 0.35 MG PO TABS
1.0000 | ORAL_TABLET | Freq: Every day | ORAL | 6 refills | Status: DC
Start: 2020-09-18 — End: 2020-10-30

## 2020-09-18 NOTE — Progress Notes (Signed)
Will rx micronor

## 2020-09-21 ENCOUNTER — Ambulatory Visit: Payer: BC Managed Care – PPO | Admitting: Family Medicine

## 2020-09-25 ENCOUNTER — Other Ambulatory Visit: Payer: Self-pay | Admitting: Family Medicine

## 2020-09-25 DIAGNOSIS — G479 Sleep disorder, unspecified: Secondary | ICD-10-CM

## 2020-09-25 MED ORDER — ALPRAZOLAM 1 MG PO TABS: ORAL_TABLET | ORAL | 0 refills | Status: DC

## 2020-10-05 ENCOUNTER — Ambulatory Visit (HOSPITAL_COMMUNITY)
Admission: RE | Admit: 2020-10-05 | Discharge: 2020-10-05 | Disposition: A | Discharge status: Home / Self Care | Payer: BC Managed Care – PPO | Source: Ambulatory Visit | Attending: Adult Health | Admitting: Adult Health

## 2020-10-05 ENCOUNTER — Other Ambulatory Visit: Payer: Self-pay

## 2020-10-05 DIAGNOSIS — Z1231 Encounter for screening mammogram for malignant neoplasm of breast: Secondary | ICD-10-CM | POA: Insufficient documentation

## 2020-10-10 ENCOUNTER — Encounter: Payer: Self-pay | Admitting: Family Medicine

## 2020-10-10 ENCOUNTER — Ambulatory Visit: Payer: BC Managed Care – PPO | Admitting: Family Medicine

## 2020-10-10 ENCOUNTER — Other Ambulatory Visit: Payer: Self-pay

## 2020-10-10 VITALS — BP 122/74 | Temp 97.7°F | Wt 158.4 lb

## 2020-10-10 DIAGNOSIS — I10 Essential (primary) hypertension: Secondary | ICD-10-CM

## 2020-10-10 DIAGNOSIS — N939 Abnormal uterine and vaginal bleeding, unspecified: Secondary | ICD-10-CM | POA: Diagnosis not present

## 2020-10-10 MED ORDER — LISINOPRIL 2.5 MG PO TABS: ORAL_TABLET | ORAL | 1 refills | Status: DC

## 2020-10-10 NOTE — Progress Notes (Signed)
   Subjective:    Patient ID: [name], female    DOB: April 12, 1981, 40 y.o.   MRN: [id number]  HPI Pt here for med check. Pt states blood pressure fluctuates and has had some light headedness. Does not check bp on regular basis.   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 Pt has cut out soda and is drinking a lot of water; will drink a Coke Zero if she is needing something.   Pt also having irregular cycles that make her bleed more.   Pt mom had colonoscopy and they found a polyp and pt wants to know if she needs to do anything to be proactive.   Patient denies any current rectal bleeding or other type of problem  Patient is trying to lose weight by watching how she eats and be more active  Patient states intermittently she gets very woozy when she stands up and dizzy.  Review of Systems    Please see above Objective:   Physical Exam  Lungs are clear heart regular pulse normal blood pressure sitting then standing does show some mild drop      Assessment & Plan:  Probable orthostatic changes causing blood pressure drop causing the dizziness I would recommend for the patient to get a CBC to make sure she is not having anemia because she is having heavy menstrual bleeding Also recommend for the patient to follow-up with gynecology accordingly to their recommendations it is possible the patient may benefit from having surgery to have hysterectomy  Patient will follow-up in early July to see how she is doing follow-up sooner if problems She will do her lab work very soon Reduce lisinopril to 2.5 mg Finally we will touch base with GI to see if they feel that she needs any type of intervention regarding family history of abnormal polyps

## 2020-10-19 ENCOUNTER — Ambulatory Visit: Payer: BC Managed Care – PPO | Admitting: Family Medicine

## 2020-10-19 ENCOUNTER — Other Ambulatory Visit: Payer: Self-pay

## 2020-10-19 ENCOUNTER — Ambulatory Visit
Admission: EM | Admit: 2020-10-19 | Discharge: 2020-10-19 | Disposition: A | Discharge status: Home / Self Care | Payer: BC Managed Care – PPO | Attending: Emergency Medicine | Admitting: Emergency Medicine

## 2020-10-19 DIAGNOSIS — R059 Cough, unspecified: Secondary | ICD-10-CM | POA: Diagnosis not present

## 2020-10-19 DIAGNOSIS — J019 Acute sinusitis, unspecified: Secondary | ICD-10-CM

## 2020-10-19 DIAGNOSIS — R0981 Nasal congestion: Secondary | ICD-10-CM

## 2020-10-19 MED ORDER — DOXYCYCLINE HYCLATE 100 MG PO CAPS
100.0000 mg | ORAL_CAPSULE | Freq: Two times a day (BID) | ORAL | 0 refills | Status: DC
Start: 2020-10-19 — End: 2020-11-20

## 2020-10-19 MED ORDER — PREDNISONE 20 MG PO TABS: 20.0000 mg | ORAL_TABLET | Freq: Two times a day (BID) | ORAL | 0 refills | Status: AC

## 2020-10-19 MED ORDER — PROMETHAZINE-DM 6.25-15 MG/5ML PO SYRP: 5.0000 mL | ORAL_SOLUTION | Freq: Every evening | ORAL | 0 refills | Status: DC | PRN

## 2020-10-19 NOTE — Discharge Instructions (Signed)
Declines covid/ flu test.  At home covid test negative Get plenty of rest and push fluids Promethazine cough syrup prescribed Prednisone prescribed for congestion Doxycyline for possible sinus infection Use OTC zyrtec for nasal congestion, runny nose, and/or sore throat Use OTC flonase for nasal congestion and runny nose Use medications daily for symptom relief Use OTC medications like ibuprofen or tylenol as needed fever or pain Follow up with Dr. Nicki Reaper next week for recheck and to ensure symptoms are improving Call or go to the ED if you have any new or worsening symptoms such as fever, worsening cough, shortness of breath, chest tightness, chest pain, turning blue, changes in mental status, etc..[name]

## 2020-10-19 NOTE — ED Provider Notes (Signed)
[name]   [id number] 10/19/20 Arrival Time: 1008   CC: Cough and congestion  SUBJECTIVE: History from: patient.  [name] is a 40 y.o. female who presents with nasal congestion, ear pain, sinus pain/pressure, and productive cough x 4-5 days.  Denies sick exposure to COVID, flu or strep.  Has tried OTC medications without relief.  Symptoms are made worse at night.  Reports previous symptoms in the past.   Denies fever, SOB, wheezing, chest pain, nausea, changes in bowel or bladder habits.    ROS: As per HPI.  All other pertinent ROS negative.     Past Medical History:  Diagnosis Date  . AMA (advanced maternal age) multigravida 35+ 05/31/2014  . Anxiety 02/01/2015  . Asthma   . 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 Fibroids 08/14/2015  . History of kidney stones   . Hypertension    history of   . Hypothyroid 11/04/2017  . Klippel-Feil deformity    syrinx  . Menorrhagia with regular cycle 07/26/2015  . PONV (postoperative nausea and vomiting)   . Pregnant 05/31/2014  . Sleep disturbance 07/26/2015  . Spotting 05/31/2014   Past Surgical History:  Procedure Laterality Date  . CAST APPLICATION Right 4/0/8144   Procedure: CAST APPLICATION;  Surgeon: Carole Civil, MD;  Location: [facility];  Service: Orthopedics;  Laterality: Right;  . CESAREAN SECTION N/A 12/27/2014   Procedure: CESAREAN SECTION;  Surgeon: Osborne Oman, MD;  Location: [facility];  Service: Obstetrics;  Laterality: N/A;  . CHOLECYSTECTOMY    . DILATION AND CURETTAGE OF UTERUS    . HYSTEROSCOPY N/A 01/31/2017   Procedure: HYSTEROSCOPY WITH HYDROTHERMAL ABLATION;  Surgeon: Jonnie Kind, MD;  Location: [facility];  Service: Gynecology;  Laterality: N/A;  . LAPAROSCOPIC BILATERAL SALPINGECTOMY Bilateral 01/31/2017   Procedure: LAPAROSCOPIC BILATERAL SALPINGECTOMY WITH REMOVAL OF RIGHT OPARATUBAL CYST;  Surgeon: Jonnie Kind, MD;  Location: [facility];  Service: Gynecology;  Laterality: Bilateral;  .  LITHOTRIPSY    . ORIF ANKLE FRACTURE Right 02/16/2019   Procedure: OPEN REDUCTION INTERNAL FIXATION (ORIF) ANKLE FRACTURE;  Surgeon: Carole Civil, MD;  Location: [facility];  Service: Orthopedics;  Laterality: Right;   Allergies  Allergen Reactions  . Biaxin [Clarithromycin] Hives  . Cefzil [Cefprozil] Nausea Only  . Ciprofloxacin Hives  . Hydrocodone Other (See Comments)    Causes patient to feel wired  . Morphine And Related   . Other     [place] pear tree-sinus symptoms  . Sulfa Antibiotics Hives   No current facility-administered medications on file prior to encounter.   Current Outpatient Medications on File Prior to Encounter  Medication Sig Dispense Refill  . albuterol (PROVENTIL HFA;VENTOLIN HFA) 108 (90 Base) MCG/ACT inhaler Inhale 1-2 puffs into the lungs every 6 (six) hours as needed for wheezing or shortness of breath. 6.7 g 2  . ALPRAZolam (XANAX) 1 MG tablet qhs prn 30 tablet 0  . levothyroxine (SYNTHROID) 50 MCG tablet Take 1 tablet (50 mcg total) by mouth daily before breakfast. 30 tablet 2  . lisinopril (ZESTRIL) 2.5 MG tablet Take one tablet po daily 90 tablet 1  . norethindrone (MICRONOR) 0.35 MG tablet Take 1 tablet (0.35 mg total) by mouth daily. 28 tablet 6   Social History   Socioeconomic History  . Marital status: Divorced    Spouse name: Not on file  . Number of children: Not on file  . Years of education: Not on file  . Highest education level: Not on  file  Occupational History  . Not on file  Tobacco Use  . Smoking status: Never Smoker  . Smokeless tobacco: Never Used  Vaping Use  . Vaping Use: Never used  Substance and Sexual Activity  . Alcohol use: Yes    Comment: glass of wine occ  . Drug use: No  . Sexual activity: Not Currently    Birth control/protection: Surgical    Comment: tubal   Other Topics Concern  . Not on file  Social History Narrative  . Not on file   Social Determinants of Health   Financial Resource Strain: Not on  file  Food Insecurity: Not on file  Transportation Needs: Not on file  Physical Activity: Not on file  Stress: Not on file  Social Connections: Not on file  Intimate Partner Violence: Not on file   Family History  Problem Relation Age of Onset  . Diabetes Paternal Grandfather   . Cancer Paternal Grandfather        lung  . Diabetes Paternal Grandmother   . Diabetes Maternal Grandmother   . Diabetes Maternal Grandfather   . Hypothyroidism Mother   . Cancer Mother   . Cancer Paternal Aunt     OBJECTIVE:  Vitals:   10/19/20 1021  BP: 126/86  Pulse: (!) 102  Resp: 16  Temp: 97.9 F (36.6 C)  TempSrc: Tympanic  SpO2: 97%     General appearance: alert; appears fatigued, but nontoxic; speaking in full sentences and tolerating own secretions HEENT: NCAT; Ears: EACs clear, TMs pearly gray; Eyes: PERRL.  EOM grossly intact. Nose: nares patent without rhinorrhea, Throat: oropharynx clear, tonsils non erythematous or enlarged, uvula midline  Neck: supple without LAD Lungs: unlabored respirations, symmetrical air entry; cough: mild; no respiratory distress; CTAB Heart: regular rate and rhythm.   Skin: warm and dry Psychological: alert and cooperative; normal mood and affect   ASSESSMENT & PLAN:  1. Cough   2. Nasal congestion   3. Acute non-recurrent sinusitis, unspecified location     Meds ordered this encounter  Medications  . promethazine-dextromethorphan (PROMETHAZINE-DM) 6.25-15 MG/5ML syrup    Sig: Take 5 mLs by mouth at bedtime as needed for cough.    Dispense:  60 mL    Refill:  0    Order Specific Question:   Supervising Provider    Answer:   Raylene Everts [[id number]]  . predniSONE (DELTASONE) 20 MG tablet    Sig: Take 1 tablet (20 mg total) by mouth 2 (two) times daily with a meal for 5 days.    Dispense:  10 tablet    Refill:  0    Order Specific Question:   Supervising Provider    Answer:   Raylene Everts [[id number]]  . doxycycline (VIBRAMYCIN) 100  MG capsule    Sig: Take 1 capsule (100 mg total) by mouth 2 (two) times daily.    Dispense:  20 capsule    Refill:  0    Order Specific Question:   Supervising Provider    Answer:   Raylene Everts [[id number]]    Declines covid/ flu test.  At home covid test negative Get plenty of rest and push fluids Promethazine cough syrup prescribed Prednisone prescribed for congestion Doxycyline for possible sinus infection Use OTC zyrtec for nasal congestion, runny nose, and/or sore throat Use OTC flonase for nasal congestion and runny nose Use medications daily for symptom relief Use OTC medications like ibuprofen or tylenol as needed fever or pain Follow up  with Dr. Nicki Reaper next week for recheck and to ensure symptoms are improving Call or go to the ED if you have any new or worsening symptoms such as fever, worsening cough, shortness of breath, chest tightness, chest pain, turning blue, changes in mental status, etc...   Reviewed expectations re: course of current medical issues. Questions answered. Outlined signs and symptoms indicating need for more acute intervention. Patient verbalized understanding. After Visit Summary given.         Lestine Box, PA-C 10/19/20 1042

## 2020-10-19 NOTE — ED Triage Notes (Signed)
Provider triage  

## 2020-10-20 ENCOUNTER — Ambulatory Visit: Payer: BC Managed Care – PPO | Admitting: Family Medicine

## 2020-10-22 ENCOUNTER — Other Ambulatory Visit: Payer: Self-pay | Admitting: Family Medicine

## 2020-10-22 DIAGNOSIS — G479 Sleep disorder, unspecified: Secondary | ICD-10-CM

## 2020-10-23 ENCOUNTER — Other Ambulatory Visit: Payer: Self-pay | Admitting: Family Medicine

## 2020-10-23 DIAGNOSIS — G479 Sleep disorder, unspecified: Secondary | ICD-10-CM

## 2020-10-23 MED ORDER — ALPRAZOLAM 1 MG PO TABS: ORAL_TABLET | ORAL | 0 refills | Status: DC

## 2020-10-23 NOTE — Telephone Encounter (Signed)
Sent mychart message

## 2020-10-23 NOTE — Telephone Encounter (Signed)
Patient scheduled appointment for 5/26

## 2020-10-28 NOTE — Progress Notes (Signed)
Nurses-I did touch base with gastroenterology regarding this patient.  This patient has a family history of colon cancer.  Wondered when she should start doing her screening.  According to gastroenterology she should go ahead and start with screening.  We could refer her for this summer to be seen by gastroenterology.  If patient does not have a preference I recommend [facility] with referral to Roseanne Kaufman then the gastroenterologist would be the one doing the colonoscopy thanks

## 2020-10-30 ENCOUNTER — Other Ambulatory Visit: Payer: Self-pay | Admitting: Adult Health

## 2020-10-30 MED ORDER — MEGESTROL ACETATE 40 MG PO TABS
40.0000 mg | ORAL_TABLET | Freq: Every day | ORAL | 1 refills | Status: DC
Start: 2020-10-30 — End: 2021-08-16

## 2020-10-30 NOTE — Progress Notes (Signed)
Will rx megace 

## 2020-10-30 NOTE — Progress Notes (Signed)
10/30/20-sent pt a my chart message to see if she has a preference or if Marylee Floras is OK

## 2020-11-03 NOTE — Progress Notes (Signed)
11/03/20-left message to return call Pt did read my chart message that was sent

## 2020-11-06 ENCOUNTER — Other Ambulatory Visit: Payer: Self-pay | Admitting: *Deleted

## 2020-11-06 DIAGNOSIS — Z1211 Encounter for screening for malignant neoplasm of colon: Secondary | ICD-10-CM

## 2020-11-06 NOTE — Progress Notes (Signed)
5/23 discussed with pt. Pt does want referral to anna boone. Referral put in

## 2020-11-09 ENCOUNTER — Ambulatory Visit: Payer: BC Managed Care – PPO | Admitting: Family Medicine

## 2020-11-20 ENCOUNTER — Telehealth (INDEPENDENT_AMBULATORY_CARE_PROVIDER_SITE_OTHER): Payer: BC Managed Care – PPO | Admitting: Family Medicine

## 2020-11-20 ENCOUNTER — Other Ambulatory Visit: Payer: Self-pay

## 2020-11-20 DIAGNOSIS — J0100 Acute maxillary sinusitis, unspecified: Secondary | ICD-10-CM

## 2020-11-20 DIAGNOSIS — H1030 Unspecified acute conjunctivitis, unspecified eye: Secondary | ICD-10-CM

## 2020-11-20 MED ORDER — AMOXICILLIN 500 MG PO CAPS
500.0000 mg | ORAL_CAPSULE | Freq: Three times a day (TID) | ORAL | 0 refills | Status: DC
Start: 2020-11-20 — End: 2020-11-23

## 2020-11-20 MED ORDER — PROMETHAZINE-DM 6.25-15 MG/5ML PO SYRP: 5.0000 mL | ORAL_SOLUTION | Freq: Every evening | ORAL | 0 refills | Status: DC | PRN

## 2020-11-20 MED ORDER — POLYMYXIN B-TRIMETHOPRIM 10000-0.1 UNIT/ML-% OP SOLN: 1.0000 [drp] | OPHTHALMIC | 0 refills | Status: DC

## 2020-11-20 NOTE — Progress Notes (Signed)
Patient ID: [name], female    DOB: 1980/08/09, 40 y.o.   MRN: [id number]   Virtual Visit via Telephone Note  I connected with [name] on 11/20/20 at  2:30 PM EDT by telephone and verified that I am speaking with the correct person using two identifiers.  Location: Patient: home Provider: office   I discussed the limitations, risks, security and privacy concerns of performing an evaluation and management service by telephone and the availability of in person appointments. I also discussed with the patient that there may be a patient responsible charge related to this service. The patient expressed understanding and agreed to proceed. Chief Complaint  Patient presents with  . Sinusitis   Subjective:    HPI  CC- congestion and cough. Eye drainage. Started 3 -4 days ago.  Taking nyquil, dayquil, honey, and ibuprofen.  covid test negative that was taken yesterday.   Drainage with green and thick, nasal and coughing it up. 3 days ago.  Hard time coughing.  Taking 2 cough syrups.  Pt has h/o asthma when exercising prn. No fever. Took home test for covid negative. Having some sore throat from coughing.  No ear pain. Pain under eyes and pain with looking down.    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   Outpatient Encounter Medications as of 11/20/2020  Medication Sig  . amoxicillin (AMOXIL) 500 MG capsule Take 1 capsule (500 mg total) by mouth 3 (three) times daily for 10 days.  Marland Kitchen levothyroxine (SYNTHROID) 50 MCG tablet Take 1 tablet (50 mcg total) by mouth  daily before breakfast.  . lisinopril (ZESTRIL) 2.5 MG tablet Take one tablet po daily  . megestrol (MEGACE) 40 MG tablet Take 1 tablet (40 mg total) by mouth daily. Take 3 x 5 days then 2 x 5 days then 1 daily til bleeding stops  . trimethoprim-polymyxin b (POLYTRIM) ophthalmic solution Place 1 drop into both eyes every 4 (four) hours. For 7 days  . albuterol (PROVENTIL HFA;VENTOLIN HFA) 108 (90 Base) MCG/ACT inhaler Inhale 1-2 puffs into the lungs every 6 (six) hours as needed for wheezing or shortness of breath.  . ALPRAZolam (XANAX) 1 MG tablet qhs prn  . promethazine-dextromethorphan (PROMETHAZINE-DM) 6.25-15 MG/5ML syrup Take 5 mLs by mouth at bedtime as needed for cough.  . [DISCONTINUED] doxycycline (VIBRAMYCIN) 100 MG capsule Take 1 capsule (100 mg total) by mouth 2 (two) times daily.  . [DISCONTINUED] promethazine-dextromethorphan (PROMETHAZINE-DM) 6.25-15 MG/5ML syrup Take 5 mLs by mouth at bedtime as needed for cough.   No facility-administered encounter medications on file as of 11/20/2020.     Review of Systems  Constitutional: Negative for chills and fever.  HENT: Positive for congestion, sinus pressure and sinus pain. Negative for ear pain, rhinorrhea and sore throat.   Eyes: Positive for discharge and itching. Negative for pain.  Respiratory: Positive for cough. Negative for shortness of breath.   Gastrointestinal: Negative for constipation, diarrhea, nausea and vomiting.     Vitals There were no vitals taken for this visit.  Objective:   Physical Exam  No PE due to phone visit.  Assessment and Plan   1. Acute non-recurrent maxillary sinusitis  2. Acute conjunctivitis, unspecified acute conjunctivitis type, unspecified laterality  Pt has tolerated augmentin in past.  Will give amoxicillin tid for 10 days. Gave polytrim for conjunctivitis. And ordered cough syrup prn for severe coughing.   Return if symptoms worsen or fail to improve.   11/20/2020     Follow Up Instructions:    I discussed the assessment and treatment plan with the patient. The patient was provided an opportunity to ask questions and all were answered. The patient agreed with the plan and demonstrated an understanding of the instructions.   The patient was advised to call back or seek an in-person evaluation if the symptoms worsen or if the condition fails to improve as anticipated.  I provided 15 minutes of non-face-to-face time during this encounter.

## 2020-11-21 ENCOUNTER — Other Ambulatory Visit: Payer: Self-pay | Admitting: Family Medicine

## 2020-11-21 DIAGNOSIS — G479 Sleep disorder, unspecified: Secondary | ICD-10-CM

## 2020-11-23 ENCOUNTER — Ambulatory Visit: Payer: BC Managed Care – PPO | Admitting: Nurse Practitioner

## 2020-11-23 ENCOUNTER — Other Ambulatory Visit: Payer: Self-pay | Admitting: Family Medicine

## 2020-11-23 ENCOUNTER — Other Ambulatory Visit: Payer: Self-pay

## 2020-11-23 ENCOUNTER — Encounter: Payer: Self-pay | Admitting: Nurse Practitioner

## 2020-11-23 VITALS — HR 111 | Temp 98.6°F | Ht 59.0 in | Wt 158.0 lb

## 2020-11-23 DIAGNOSIS — J069 Acute upper respiratory infection, unspecified: Secondary | ICD-10-CM | POA: Diagnosis not present

## 2020-11-23 DIAGNOSIS — R059 Cough, unspecified: Secondary | ICD-10-CM | POA: Diagnosis not present

## 2020-11-23 DIAGNOSIS — B9689 Other specified bacterial agents as the cause of diseases classified elsewhere: Secondary | ICD-10-CM

## 2020-11-23 DIAGNOSIS — G479 Sleep disorder, unspecified: Secondary | ICD-10-CM

## 2020-11-23 MED ORDER — HYDROCODONE BIT-HOMATROP MBR 5-1.5 MG/5ML PO SOLN: ORAL | 0 refills | Status: DC

## 2020-11-23 MED ORDER — ALPRAZOLAM 1 MG PO TABS
ORAL_TABLET | ORAL | 0 refills | Status: DC
Start: 2020-11-23 — End: 2021-01-17

## 2020-11-23 MED ORDER — AMOXICILLIN-POT CLAVULANATE 875-125 MG PO TABS: 1.0000 | ORAL_TABLET | Freq: Two times a day (BID) | ORAL | 0 refills | Status: DC

## 2020-11-23 NOTE — Progress Notes (Signed)
   Subjective:    Patient ID: [name], female    DOB: [birth year], 40 y.o.   MRN: [id number]  HPI  Follow up cough not sleeping x 4 night due to cough, congestion, sore throat, eye discharge, nasal congestion.  Currently on amoxicillin prescribed on 6/6.  Had a negative COVID test 5 days ago.  Her daughter was seen by Dr. Nicki Reaper several days ago and prescribed an antibiotic for similar symptoms, she is better.  Frequent cough, worse at night.  Producing thick mucus at times.  Slight chest pain and tightness with prolonged cough.  No noticeable wheezing.  Has a history of exercise-induced asthma.  Tries to avoid use of albuterol since this "wires were up".  Occasional posttussive vomiting.  Ear pain.  No fever.  Has had some oozing noted in the eyes each morning, green in color.  No visual changes.  No blurred vision.  No light sensitivity.  Mild facial area headache.  No severe headache.  Mild neck soreness but able to move her neck without difficulty.  Taking fluids well.  Voiding normal limit. Has tried multiple OTC cough suppressants including NyQuil and prescription promethazine DM that was prescribed with minimal improvement.     Objective:   Physical Exam NAD.  Alert, oriented.  TMs clear effusion, no erythema.  Pharynx moderately erythematous with green PND noted.  No exudate noted.  Mucous membranes moist.  Neck supple with mild soft anterior adenopathy.  Can perform full ROM of the neck without difficulty.  Lungs clear.  Heart regular rate rhythm. Today's Vitals   11/23/20 1548  Pulse: (!) 111  Temp: 98.6 F (37 C)  SpO2: 98%  Weight: 158 lb (71.7 kg)  Height: 4\' 11"  (1.499 m)   Body mass index is 31.91 kg/m.        Assessment & Plan:  Bacterial URI  Cough Meds ordered this encounter  Medications   amoxicillin-clavulanate (AUGMENTIN) 875-125 MG tablet    Sig: Take 1 tablet by mouth 2 (two) times daily.    Dispense:  20 tablet    Refill:  0    Order Specific  Question:   Supervising Provider    Answer:   Sallee Lange A [[id number]]   HYDROcodone bit-homatropine (HYCODAN) 5-1.5 MG/5ML syrup    Sig: Take one tsp po q 4-6 hours prn cough    Dispense:  90 mL    Refill:  0    No allergy to hydrocodone    Order Specific Question:   Supervising Provider    Answer:   Sallee Lange A [[id number]]   Drowsiness precautions with hydrocodone although patient states it tends to wear her up, no actual allergy.  Advised not to take it at the same time as her alprazolam. Limited choice of antibiotics due to her allergies. Switch to Augmentin as directed.  Patient to call back next week if no improvement, sooner if worse.  Warning signs reviewed.

## 2020-11-24 ENCOUNTER — Encounter: Payer: Self-pay | Admitting: *Deleted

## 2020-11-26 MED ORDER — ALPRAZOLAM 1 MG PO TABS: ORAL_TABLET | ORAL | 2 refills | Status: DC

## 2021-01-17 ENCOUNTER — Other Ambulatory Visit: Payer: Self-pay | Admitting: Family Medicine

## 2021-01-17 ENCOUNTER — Encounter: Payer: Self-pay | Admitting: Family Medicine

## 2021-01-17 DIAGNOSIS — G479 Sleep disorder, unspecified: Secondary | ICD-10-CM

## 2021-01-17 MED ORDER — ALPRAZOLAM 1 MG PO TABS: ORAL_TABLET | ORAL | 2 refills | Status: DC

## 2021-01-17 NOTE — Telephone Encounter (Signed)
Please let Taleiyah know that I did send in refills to her pharmacy in [place].  If any problems with COVID please follow-up  Otherwise follow-up office visit later in the fall  Thanks-Dr. Nicki Reaper

## 2021-02-14 ENCOUNTER — Encounter: Payer: Self-pay | Admitting: Family Medicine

## 2021-03-28 ENCOUNTER — Telehealth: Payer: BC Managed Care – PPO | Admitting: Gastroenterology

## 2021-03-28 ENCOUNTER — Other Ambulatory Visit: Payer: Self-pay

## 2021-03-28 ENCOUNTER — Telehealth: Payer: Self-pay | Admitting: *Deleted

## 2021-03-28 NOTE — Telephone Encounter (Signed)
Pt consented to a virtual visit today.   

## 2021-03-28 NOTE — Telephone Encounter (Signed)
Francee Piccolo Ryland, you are scheduled for a virtual visit with your provider today.  Just as we do with appointments in the office, we must obtain your consent to participate.  Your consent will be active for this visit and any virtual visit you may have with one of our providers in the next 365 days.  If you have a MyChart account, I can also send a copy of this consent to you electronically.  All virtual visits are billed to your insurance company just like a traditional visit in the office.  As this is a virtual visit, video technology does not allow for your provider to perform a traditional examination.  This may limit your provider's ability to fully assess your condition.  If your provider identifies any concerns that need to be evaluated in person or the need to arrange testing such as labs, EKG, etc, we will make arrangements to do so.  Although advances in technology are sophisticated, we cannot ensure that it will always work on either your end or our end.  If the connection with a video visit is poor, we may have to switch to a telephone visit.  With either a video or telephone visit, we are not always able to ensure that we have a secure connection.   I need to obtain your verbal consent now.   Are you willing to proceed with your visit today?

## 2021-05-03 ENCOUNTER — Ambulatory Visit: Payer: BC Managed Care – PPO | Admitting: Family Medicine

## 2021-05-03 ENCOUNTER — Encounter: Payer: Self-pay | Admitting: Family Medicine

## 2021-05-03 ENCOUNTER — Other Ambulatory Visit: Payer: Self-pay

## 2021-05-03 VITALS — BP 126/84 | HR 89 | Temp 98.0°F | Ht 59.0 in | Wt 151.0 lb

## 2021-05-03 DIAGNOSIS — E039 Hypothyroidism, unspecified: Secondary | ICD-10-CM | POA: Diagnosis not present

## 2021-05-03 DIAGNOSIS — G479 Sleep disorder, unspecified: Secondary | ICD-10-CM

## 2021-05-03 DIAGNOSIS — I10 Essential (primary) hypertension: Secondary | ICD-10-CM | POA: Diagnosis not present

## 2021-05-03 MED ORDER — LEVOTHYROXINE SODIUM 50 MCG PO TABS
50.0000 ug | ORAL_TABLET | Freq: Every day | ORAL | 1 refills | Status: DC
Start: 2021-05-03 — End: 2021-12-13

## 2021-05-03 MED ORDER — ALPRAZOLAM 1 MG PO TABS
ORAL_TABLET | ORAL | 5 refills | Status: DC
Start: 2021-05-03 — End: 2021-11-16

## 2021-05-03 MED ORDER — LISINOPRIL 2.5 MG PO TABS: ORAL_TABLET | ORAL | 1 refills | Status: DC

## 2021-05-03 NOTE — Progress Notes (Signed)
   Subjective:    Patient ID: [name], female    DOB: 24-Mar-1981, 40 y.o.   MRN: [id number]  HPI Sleep disorder/ anxiety follow up  medication check / refill  Primary hypertension - Plan: TSH, Basic metabolic panel, Lipid panel  Sleep disorder - Plan: ALPRAZolam (XANAX) 1 MG tablet, TSH, Basic metabolic panel, Lipid panel  Hypothyroidism, unspecified type - Plan: levothyroxine (SYNTHROID) 50 MCG tablet, TSH, Basic metabolic panel, Lipid panel Patient uses her Xanax at nighttime to help her sleep denies abusing it Takes her blood pressure medicine regular basis watches salt in the diet stays physically active Takes her thyroid medicine every morning states her energy level doing okay  Review of Systems     Objective:   Physical Exam  General-in no acute distress Eyes-no discharge Lungs-respiratory rate normal, CTA CV-no murmurs,RRR Extremities skin warm dry no edema Neuro grossly normal Behavior normal, alert       Assessment & Plan:  1. Sleep disorder Uses Xanax every single night follow-up if any ongoing troubles caution drowsiness - ALPRAZolam (XANAX) 1 MG tablet; qhs prn  Dispense: 30 tablet; Refill: 5 - TSH - Basic metabolic panel - Lipid panel  2. Primary hypertension Take blood pressure medicine watch diet stay active - TSH - Basic metabolic panel - Lipid panel  3. Hypothyroidism, unspecified type Continue take thyroid medicine check lab work - levothyroxine (SYNTHROID) 50 MCG tablet; Take 1 tablet (50 mcg total) by mouth daily before breakfast.  Dispense: 90 tablet; Refill: 1 - TSH - Basic metabolic panel - Lipid panel  Follow-up 6 months

## 2021-05-07 ENCOUNTER — Encounter: Payer: Self-pay | Admitting: Family Medicine

## 2021-08-16 ENCOUNTER — Other Ambulatory Visit: Payer: Self-pay

## 2021-08-16 ENCOUNTER — Encounter: Payer: Self-pay | Admitting: Nurse Practitioner

## 2021-08-16 ENCOUNTER — Ambulatory Visit (HOSPITAL_COMMUNITY)
Admission: RE | Admit: 2021-08-16 | Discharge: 2021-08-16 | Disposition: A | Discharge status: Home / Self Care | Payer: BC Managed Care – PPO | Source: Ambulatory Visit | Attending: Nurse Practitioner | Admitting: Nurse Practitioner

## 2021-08-16 ENCOUNTER — Ambulatory Visit (INDEPENDENT_AMBULATORY_CARE_PROVIDER_SITE_OTHER): Payer: BC Managed Care – PPO | Admitting: Nurse Practitioner

## 2021-08-16 ENCOUNTER — Encounter: Payer: Self-pay | Admitting: Family Medicine

## 2021-08-16 VITALS — BP 122/74 | HR 117 | Temp 98.8°F | Wt 150.8 lb

## 2021-08-16 DIAGNOSIS — R051 Acute cough: Secondary | ICD-10-CM

## 2021-08-16 DIAGNOSIS — R110 Nausea: Secondary | ICD-10-CM

## 2021-08-16 DIAGNOSIS — R0789 Other chest pain: Secondary | ICD-10-CM

## 2021-08-16 DIAGNOSIS — J189 Pneumonia, unspecified organism: Secondary | ICD-10-CM

## 2021-08-16 DIAGNOSIS — M549 Dorsalgia, unspecified: Secondary | ICD-10-CM | POA: Insufficient documentation

## 2021-08-16 MED ORDER — AMOXICILLIN 500 MG PO CAPS: 1000.0000 mg | ORAL_CAPSULE | Freq: Three times a day (TID) | ORAL | 0 refills | Status: AC

## 2021-08-16 MED ORDER — PROMETHAZINE HCL 25 MG PO TABS
25.0000 mg | ORAL_TABLET | Freq: Three times a day (TID) | ORAL | 0 refills | Status: DC | PRN
Start: 2021-08-16 — End: 2021-12-13

## 2021-08-16 MED ORDER — PREDNISONE 20 MG PO TABS: 20.0000 mg | ORAL_TABLET | Freq: Two times a day (BID) | ORAL | 0 refills | Status: AC

## 2021-08-16 NOTE — Progress Notes (Signed)
? ?Subjective:  ? ? Patient ID: [name], female    DOB: 11-07-1980, 41 y.o.   MRN: [id number] ? ?HPI ? ?Patient reports to the clinic with complaints of nonproductive cough, shortness of breath, lightheadedness, fatigue, decreased appetite, headache, wheezing, low-grade fever, chills, body aches x2 weeks.  Patient states that cough started out productive but is currently nonproductive.  Patient also complains of left-sided back pain that also started about as well as sternal pain.  Patient admits that it hurts to breathe.  Patient denies sinus pain, sinus pressure, ear pain, runny nose, sore throat, nasal congestion, difficulty breathing.  ? ?Patient has been taking NyQuil, DayQuil, Mucinex, and using a humidifier which is offered mild relief relief.  Patient states that she has also been using her albuterol inhaler 7 days out of the week which seems to help.   ? ?Review of Systems  ?Constitutional:  Positive for appetite change, chills, fatigue and fever.  ?Respiratory:  Positive for cough, shortness of breath and wheezing.   ?Neurological:  Positive for light-headedness.  ?All other systems reviewed and are negative. ? ?   ?Objective:  ? Physical Exam ?Constitutional:   ?   General: She is not in acute distress. ?   Appearance: Normal appearance. She is obese. She is not ill-appearing or toxic-appearing.  ?HENT:  ?   Nose: Nose normal. No congestion or rhinorrhea.  ?   Mouth/Throat:  ?   Mouth: Mucous membranes are moist.  ?Cardiovascular:  ?   Rate and Rhythm: Normal rate and regular rhythm.  ?   Pulses: Normal pulses.  ?   Heart sounds: No murmur heard. ?Pulmonary:  ?   Effort: Pulmonary effort is normal. No respiratory distress.  ?   Breath sounds: Normal breath sounds. No stridor. No wheezing, rhonchi or rales.  ?Chest:  ?   Chest wall: Tenderness present.  ?Musculoskeletal:  ?   Cervical back: Normal.  ?   Thoracic back: Tenderness present. No swelling, edema, deformity, signs of trauma,  lacerations, spasms or bony tenderness. Normal range of motion. No scoliosis.  ?   Lumbar back: Normal.  ?   Right lower leg: No edema.  ?   Left lower leg: No edema.  ?   Comments: Tender on palpation to left upper back.  No crepitus noted, no bruising, no swelling noted.  ?Skin: ?   General: Skin is warm.  ?   Capillary Refill: Capillary refill takes less than 2 seconds.  ?   Coloration: Skin is not pale.  ?   Findings: No erythema.  ?Neurological:  ?   General: No focal deficit present.  ?   Mental Status: She is alert and oriented to person, place, and time.  ?Psychiatric:     ?   Mood and Affect: Mood normal.     ?   Behavior: Behavior normal.  ? ? ? ? ? ?   ?Assessment & Plan:  ? ?1. Acute cough ?-Likely pneumonia however asthma exacerbation also considered. ?-Prednisone 40 mg burst ordered. ?-Continue to use albuterol inhaler as needed. ?- DG Chest 2 View ?-Patient may take over-the-counter Robitussin or Mucinex for symptoms. ?-Continue to use humidifier as needed. ?-You should start to feel better within 2 to 3 days of taking antibiotics.  If your symptoms do not resolve please return to clinic. ? ?2. Pneumonia due to infectious organism, unspecified laterality, unspecified part of lung ?- amoxicillin (AMOXIL) 500 MG capsule; Take 2 capsules (1,000 mg total) by  mouth 3 (three) times daily for 7 days.  Dispense: 42 capsule; Refill: 0 ?-Return to clinic within 2 to 3 days if not better on antibiotics ? ?3. Sternal pain ?-Sternal pain likely related to cough.  However cardiology etiology investigated. ?-EKG normal sinus rhythm. ?- EKG 12-Lead ?-Go to emergency room if you develop increasing pain in around her chest. ?-Return to clinic if symptoms do not resolve within 2 to 3 days of taking antibiotics ? ?4. Upper back pain on left side ?- DG Chest 2 View ?- DG Thoracic Spine W/Swimmers ? ?5. Nausea ?- promethazine (PHENERGAN) 25 MG tablet; Take 1 tablet (25 mg total) by mouth every 8 (eight) hours as needed for  nausea or vomiting.  Dispense: 20 tablet; Refill: 0 ? ?

## 2021-10-16 ENCOUNTER — Encounter: Payer: Self-pay | Admitting: Family Medicine

## 2021-10-18 ENCOUNTER — Telehealth: Payer: Self-pay

## 2021-10-18 ENCOUNTER — Encounter: Payer: Self-pay | Admitting: *Deleted

## 2021-10-18 NOTE — Telephone Encounter (Signed)
PT CALLED AND STATED THAT SHE WOULD LIKE A NURSE TO CALL HER ABOUT A MEDICATION SHE WANTS TO TAKE. SHE TRIED MYCHART BUT WAS UNABLE TO SEND A MESSAGE. ?

## 2021-10-18 NOTE — Telephone Encounter (Signed)
Pt is not sleeping good. Has tried Tylenol PM, Melatonin, Valerian Root. None of these are working. What do you advise? Thanks! JSY ?

## 2021-10-18 NOTE — Telephone Encounter (Signed)
Left message @ 11:37 am. JSY ?

## 2021-10-18 NOTE — Telephone Encounter (Signed)
Left message I would need to see to prescribe, but if still has xanax try one at HS or if not could try 25 mg benadryl. Make appt if wants meds  ?

## 2021-11-14 ENCOUNTER — Encounter: Payer: Self-pay | Admitting: Family Medicine

## 2021-11-15 ENCOUNTER — Other Ambulatory Visit: Payer: Self-pay | Admitting: Family Medicine

## 2021-11-15 DIAGNOSIS — G479 Sleep disorder, unspecified: Secondary | ICD-10-CM

## 2021-11-29 ENCOUNTER — Encounter: Payer: Self-pay | Admitting: Family Medicine

## 2021-11-30 NOTE — Telephone Encounter (Signed)
Patient rescheduled with another provider due to original provider being out of office that day

## 2021-12-11 ENCOUNTER — Other Ambulatory Visit: Payer: Self-pay | Admitting: Family Medicine

## 2021-12-11 DIAGNOSIS — G479 Sleep disorder, unspecified: Secondary | ICD-10-CM

## 2021-12-13 ENCOUNTER — Other Ambulatory Visit: Payer: Self-pay | Admitting: Family Medicine

## 2021-12-13 ENCOUNTER — Encounter: Payer: Self-pay | Admitting: Family Medicine

## 2021-12-13 ENCOUNTER — Ambulatory Visit: Payer: BC Managed Care – PPO | Admitting: Family Medicine

## 2021-12-13 VITALS — BP 120/82 | HR 90 | Temp 98.4°F | Ht 59.0 in | Wt 148.0 lb

## 2021-12-13 DIAGNOSIS — G479 Sleep disorder, unspecified: Secondary | ICD-10-CM

## 2021-12-13 DIAGNOSIS — I10 Essential (primary) hypertension: Secondary | ICD-10-CM | POA: Diagnosis not present

## 2021-12-13 DIAGNOSIS — E039 Hypothyroidism, unspecified: Secondary | ICD-10-CM

## 2021-12-13 MED ORDER — LISINOPRIL 2.5 MG PO TABS: ORAL_TABLET | ORAL | 1 refills | Status: DC

## 2021-12-13 MED ORDER — LEVOTHYROXINE SODIUM 50 MCG PO TABS: 50.0000 ug | ORAL_TABLET | Freq: Every day | ORAL | 1 refills | Status: DC

## 2021-12-13 MED ORDER — ALPRAZOLAM 1 MG PO TABS: 1.0000 mg | ORAL_TABLET | Freq: Every evening | ORAL | 5 refills | Status: DC | PRN

## 2021-12-13 NOTE — Progress Notes (Signed)
   Subjective:    Patient ID: [name], female    DOB: June 17, 1981, 41 y.o.   MRN: [id number]  Hypertension This is a chronic problem. Treatments tried: liisinopril.   Medication check GAD - reports no concerns Patient stress levels overall are doing well.  Denies any depression Takes her Xanax at nighttime to help her sleep sometimes she does half tablet sometimes a full tablet sometimes none We did discuss trying to gravitate toward lower dosing if possible She does try to watch how she eats she does fit in walking for exercise denies being depressed   Review of Systems     Objective:   Physical Exam  General-in no acute distress Eyes-no discharge Lungs-respiratory rate normal, CTA CV-no murmurs,RRR Extremities skin warm dry no edema Neuro grossly normal Behavior normal, alert       Assessment & Plan:   She is trying to keep up on mammograms Her mom recently had tubular adenoma she is going to try to get herself set up with colonoscopy she will let us know if any issues In addition to this blood pressure doing well today continue current medications She is to do her lab work in the near future Xanax at nighttime for insomnia try to reduce dose if possible Follow-up 6 months sooner if problems

## 2021-12-13 NOTE — Patient Instructions (Signed)
Hi Cosandra  It was good to see you today. Please do your lab work in the summer Have a wonderful summer We will see you back in 6 months [place]

## 2022-01-28 ENCOUNTER — Encounter: Payer: Self-pay | Admitting: Nurse Practitioner

## 2022-04-30 ENCOUNTER — Ambulatory Visit: Payer: BC Managed Care – PPO

## 2022-04-30 ENCOUNTER — Ambulatory Visit
Admission: RE | Admit: 2022-04-30 | Discharge: 2022-04-30 | Disposition: A | Discharge status: Home / Self Care | Payer: BC Managed Care – PPO | Source: Ambulatory Visit | Attending: Nurse Practitioner | Admitting: Nurse Practitioner

## 2022-04-30 VITALS — BP 140/79 | HR 97 | Temp 98.2°F | Resp 16

## 2022-04-30 DIAGNOSIS — Z20818 Contact with and (suspected) exposure to other bacterial communicable diseases: Secondary | ICD-10-CM | POA: Diagnosis not present

## 2022-04-30 DIAGNOSIS — J029 Acute pharyngitis, unspecified: Secondary | ICD-10-CM | POA: Diagnosis present

## 2022-04-30 DIAGNOSIS — R590 Localized enlarged lymph nodes: Secondary | ICD-10-CM | POA: Diagnosis not present

## 2022-04-30 DIAGNOSIS — Z1152 Encounter for screening for COVID-19: Secondary | ICD-10-CM | POA: Insufficient documentation

## 2022-04-30 LAB — RESP PANEL BY RT-PCR (FLU A&B, COVID) ARPGX2
Influenza A by PCR: NEGATIVE
Influenza B by PCR: NEGATIVE
SARS Coronavirus 2 by RT PCR: NEGATIVE

## 2022-04-30 LAB — POCT MONO SCREEN (KUC): Mono, POC: NEGATIVE

## 2022-04-30 LAB — POCT RAPID STREP A (OFFICE): Rapid Strep A Screen: NEGATIVE

## 2022-04-30 MED ORDER — BENZONATATE 100 MG PO CAPS: 100.0000 mg | ORAL_CAPSULE | Freq: Three times a day (TID) | ORAL | 0 refills | Status: DC | PRN

## 2022-04-30 MED ORDER — AMOXICILLIN 500 MG PO CAPS: 500.0000 mg | ORAL_CAPSULE | Freq: Two times a day (BID) | ORAL | 0 refills | Status: AC

## 2022-04-30 MED ORDER — PROMETHAZINE-DM 6.25-15 MG/5ML PO SYRP: 5.0000 mL | ORAL_SOLUTION | Freq: Every evening | ORAL | 0 refills | Status: AC | PRN

## 2022-04-30 NOTE — Discharge Instructions (Addendum)
Rapid strep throat test is negative and mononucleosis screen is also negative.  Throat culture is pending.  I am suspicious that you may have strep throat even though the test was negative.  Let's start amoxicillin 500 mg twice daily for 10 days to treat strep throat.  Change your toothbrush today or tomorrow after starting treatment to prevent reinfection.  We have also tested you for COVID-19 and influenza, you will see these results pop in MyChart overnight.  Some things that can make you feel better are: - Increased rest - Increasing fluid with water/sugar free electrolytes - Acetaminophen and ibuprofen as needed for fever/pain - Salt water gargling, chloraseptic spray and throat lozenges - OTC guaifenesin (Mucinex) 600 mg twice daily - Saline sinus flushes or a neti pot - Humidifying the air - cough syrup at nighttime as needed for dry cough

## 2022-04-30 NOTE — ED Provider Notes (Signed)
[facility]    CSN: [id number] Arrival date & time: 04/30/22  1152      History   Chief Complaint Chief Complaint  Patient presents with   Sore Throat    Stuffy noseVomiting Consistent cough - Entered by patient   Cough    HPI [name] is a 41 y.o. female.   Patient presents for 4-day history of congested cough that is worse at nighttime, nasal congestion, runny nose, postnasal drainage and sore throat, bilateral ear pain that is worse on the right, eye drainage that began this morning, nausea, vomiting this morning after coughing up mucus, and fatigue.  She denies known fevers, shortness of breath or chest pain, chest tightness, chest congestion, sneezing, sinus pressure or headache, ear drainage, diarrhea, loss of taste or smell, and new rash.  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  Reports her 3 children have also been sick with RSV and viral type symptoms past couple of weeks.  She has taken ibuprofen, Mucinex, and Robitussin-DM with slight relief.  Patient reports history of asthma in the past, has not needed to use rescue inhaler since feeling poorly.     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   Dysmenorrhea 09/12/2020   Vaginal irritation 09/12/2020   Encounter for cervical Pap smear with pelvic exam 09/12/2020   Sore throat  04/04/2020   Right non-suppurative otitis media 04/04/2020   Cough in adult 04/04/2020   High risk medication use 03/27/2020   S/P ORIF (open reduction internal fixation) fracture right ankle 02/16/2019 05/06/2019   Closed fracture of right ankle    Hypothyroid 11/04/2017   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   Postoperative abdominal pain 02/18/2017   Generalized anxiety disorder 12/29/2016   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   Fibroids 08/14/2015   Menorrhagia with regular cycle 07/26/2015   Sleep disorder 07/26/2015   Postpartum depression 05/15/2015   Anxiety 02/01/2015   Breech presentation, single footling 12/27/2014   Hypertension affecting pregnancy, antepartum 12/27/2014   Gestational diabetes mellitus, antepartum 12/27/2014   S/P cesarean section 12/27/2014   Unicornuate uterus affecting pregnancy in third trimester, antepartum 11/04/2014   Short cervix affecting pregnancy 11/04/2014   Hx of preterm delivery, currently pregnant 11/04/2014   ASCUS pap  07/04/2014   Supervision of high-risk pregnancy 06/29/2014   Uterine fibroid during pregnancy, antepartum 06/29/2014   Previous preterm delivery, antepartum 06/29/2014   AMA (advanced maternal age) multigravida 35+ 05/31/2014   Asthma    Hypertension     Past Surgical History:  Procedure Laterality Date   CAST APPLICATION Right 01/20/7618   Procedure: CAST APPLICATION;  Surgeon: Carole Civil, MD;  Location: [facility];  Service: Orthopedics;  Laterality: Right;   CESAREAN SECTION N/A 12/27/2014   Procedure: CESAREAN SECTION;  Surgeon: Osborne Oman, MD;  Location: [facility];  Service: Obstetrics;  Laterality: N/A;   CHOLECYSTECTOMY     DILATION AND CURETTAGE OF UTERUS     HYSTEROSCOPY N/A 01/31/2017   Procedure: HYSTEROSCOPY WITH HYDROTHERMAL ABLATION;  Surgeon: Jonnie Kind, MD;  Location: [facility];  Service: Gynecology;  Laterality: N/A;   LAPAROSCOPIC BILATERAL SALPINGECTOMY Bilateral 01/31/2017   Procedure:  LAPAROSCOPIC BILATERAL SALPINGECTOMY WITH REMOVAL OF RIGHT OPARATUBAL CYST;  Surgeon: Jonnie Kind, MD;  Location: [facility];  Service: Gynecology;  Laterality: Bilateral;   LITHOTRIPSY     ORIF ANKLE FRACTURE Right 02/16/2019   Procedure: OPEN REDUCTION INTERNAL FIXATION (ORIF) ANKLE FRACTURE;  Surgeon: Carole Civil, MD;  Location: [facility];  Service: Orthopedics;  Laterality: Right;    OB History     Gravida  3   Para  2   Term  1   Preterm  1   AB  1   Living  2      SAB  1   IAB      Ectopic      Multiple  0   Live Births  2            Home Medications    Prior to Admission medications   Medication Sig Start Date End Date Taking? Authorizing Provider  amoxicillin (AMOXIL) 500 MG capsule Take 1 capsule (500 mg total) by mouth 2 (two) times daily for 10 days. 04/30/22 05/10/22 Yes Eulogio Bear, NP  benzonatate (TESSALON) 100 MG capsule Take 1 capsule (100 mg total) by mouth 3 (three) times daily as needed for cough. Do not take with alcohol or while driving or operating heavy machinery.  May cause drowsiness. 04/30/22  Yes Noemi Chapel A, NP  ibuprofen (ADVIL) 200 MG tablet Take 200 mg by mouth every 6 (six) hours as needed.   Yes [provider]  promethazine-dextromethorphan (PROMETHAZINE-DM) 6.25-15 MG/5ML syrup Take 5 mLs by mouth at bedtime as needed for cough. Do not take with alcohol or while driving or operating heavy machinery.  May cause drowsiness. 04/30/22  Yes Eulogio Bear, NP  albuterol (PROVENTIL HFA;VENTOLIN HFA) 108 (90 Base) MCG/ACT inhaler Inhale 1-2 puffs into the lungs every 6 (six) hours as needed for wheezing or shortness of breath. 05/18/18   Cheyenne Adas, NP  ALPRAZolam Duanne Moron) 1 MG tablet Take 1 tablet (1 mg total) by mouth at bedtime as needed. 12/13/21   Kathyrn Drown, MD  levothyroxine (SYNTHROID) 50 MCG tablet Take 1 tablet (50 mcg total) by mouth daily before breakfast. 12/13/21   Kathyrn Drown, MD   lisinopril (ZESTRIL) 2.5 MG tablet Take one tablet po daily 12/13/21   Kathyrn Drown, MD    Family History Family History  Problem Relation Age of Onset   Diabetes Paternal Grandfather    Cancer Paternal Grandfather        lung   Diabetes Paternal Grandmother    Diabetes Maternal Grandmother    Diabetes Maternal Grandfather    Hypothyroidism Mother    Cancer Mother    Cancer Paternal Aunt     Social History Social History   Tobacco Use   Smoking status: Never   Smokeless tobacco: Never  Vaping Use   Vaping Use: Never used  Substance Use Topics   Alcohol use: Yes    Comment: glass of wine occ   Drug use: No     Allergies   Biaxin [clarithromycin], Cefzil [cefprozil], Ciprofloxacin, Hydrocodone, Morphine and related, Other, and Sulfa antibiotics  Review of Systems Review of Systems Per HPI  Physical Exam Triage Vital Signs ED Triage Vitals  Enc Vitals Group     BP 04/30/22 1219 (!) 140/79     Pulse Rate 04/30/22 1219 97     Resp 04/30/22 1219 16     Temp 04/30/22 1219 98.2 F (36.8 C)     Temp Source 04/30/22 1219 Oral     SpO2 04/30/22 1219 96 %     Weight --      Height --      Head Circumference --      Peak Flow --      Pain Score 04/30/22 1226 9     Pain Loc --      Pain Edu? --      Excl. in [place]? --    No data found.  Updated Vital Signs BP (!) 140/79 (BP Location: Right Arm)   Pulse 97   Temp 98.2 F (36.8 C) (Oral)   Resp 16   LMP  (Within Weeks) Comment: 2 weeks  SpO2 96%   Visual Acuity Right Eye Distance:   Left Eye Distance:   Bilateral Distance:    Right Eye Near:   Left Eye Near:    Bilateral Near:     Physical Exam Vitals and nursing note reviewed.  Constitutional:      General: She is not in acute distress.    Appearance: She is well-developed. She is not toxic-appearing.  HENT:     Head: Normocephalic and atraumatic.     Right Ear: Tympanic membrane and ear canal normal. No drainage, swelling or tenderness. No  middle ear effusion. Tympanic membrane is not erythematous.     Left Ear: Tympanic membrane and ear canal normal. No drainage, swelling or tenderness.  No middle ear effusion. Tympanic membrane is not erythematous.     Nose: No congestion or rhinorrhea.     Mouth/Throat:     Mouth: Mucous membranes are moist.     Pharynx: Oropharynx is clear. Uvula midline. Posterior oropharyngeal erythema present. No oropharyngeal exudate.     Tonsils: No tonsillar exudate. 2+ on the right. 2+ on the left.     Comments: Petechiae of posterior palate  Eyes:     Extraocular Movements:     Right eye: Normal extraocular motion.     Left eye: Normal extraocular motion.  Cardiovascular:     Rate and Rhythm: Normal rate and regular rhythm.  Pulmonary:     Effort: Pulmonary effort is normal. No respiratory distress.     Breath sounds: Normal breath sounds. No wheezing, rhonchi or rales.  Musculoskeletal:     Cervical back: Normal range of motion and neck supple.  Lymphadenopathy:     Cervical: Cervical adenopathy present.  Skin:    General: Skin is warm and dry.     Capillary Refill: Capillary refill takes less than 2 seconds.     Coloration: Skin is not pale.     Findings: No erythema or rash.  Neurological:     Mental Status: She is alert and oriented to person, place, and time.  Psychiatric:        Behavior: Behavior is cooperative.      UC Treatments / Results  Labs (all labs ordered are listed, but only abnormal results are displayed) Labs Reviewed  CULTURE, GROUP A STREP ([facility])  RESP PANEL BY RT-PCR (FLU A&B, COVID) ARPGX2  POCT RAPID STREP A (OFFICE)  POCT MONO SCREEN ([facility])  EKG   Radiology No results found.  Procedures Procedures (including critical care time)  Medications Ordered in UC Medications - No data to display  Initial Impression / Assessment and Plan / UC Course  I have reviewed the triage vital signs and the nursing notes.  Pertinent labs & imaging results that  were available during my care of the patient were reviewed by me and considered in my medical decision making (see chart for details).   Patient is well-appearing, normotensive, afebrile, not tachycardic, not tachypneic, oxygenating well on room air.    Acute pharyngitis, unspecified etiology Encounter for screening for COVID-19 Cervical lymphadenopathy Exposure to strep throat Rapid strep negative, Monospot negative Given examination, I am concerned for strep throat Will test for COVID-19 and influenza for possible viral etiology as well, although I am more suspicious for strep throat Treat with amoxicillin 500 mg twice daily for 10 days, change toothbrush after starting treatment Supportive care discussed - start cough suppressant ER and return precautions discussed Note given for work    The patient was given the opportunity to ask questions.  All questions answered to their satisfaction.  The patient is in agreement to this plan.   Final Clinical Impressions(s) / UC Diagnoses   Final diagnoses:  Acute pharyngitis, unspecified etiology  Encounter for screening for COVID-19  Cervical lymphadenopathy  Exposure to strep throat     Discharge Instructions      Rapid strep throat test is negative and mononucleosis screen is also negative.  Throat culture is pending.  I am suspicious that you may have strep throat even though the test was negative.  Let's start amoxicillin 500 mg twice daily for 10 days to treat strep throat.  Change your toothbrush today or tomorrow after starting treatment to prevent reinfection.  We have also tested you for COVID-19 and influenza, you will see these results pop in MyChart overnight.  Some things that can make you feel better are: - Increased rest - Increasing fluid with water/sugar free electrolytes - Acetaminophen and ibuprofen as needed for fever/pain - Salt water gargling, chloraseptic spray and throat lozenges - OTC guaifenesin (Mucinex) 600  mg twice daily - Saline sinus flushes or a neti pot - Humidifying the air - cough syrup at nighttime as needed for dry cough     ED Prescriptions     Medication Sig Dispense Auth. Provider   amoxicillin (AMOXIL) 500 MG capsule Take 1 capsule (500 mg total) by mouth 2 (two) times daily for 10 days. 20 capsule Noemi Chapel A, NP   promethazine-dextromethorphan (PROMETHAZINE-DM) 6.25-15 MG/5ML syrup Take 5 mLs by mouth at bedtime as needed for cough. Do not take with alcohol or while driving or operating heavy machinery.  May cause drowsiness. 118 mL Noemi Chapel A, NP   benzonatate (TESSALON) 100 MG capsule Take 1 capsule (100 mg total) by mouth 3 (three) times daily as needed for cough. Do not take with alcohol or while driving or operating heavy machinery.  May cause drowsiness. 21 capsule Eulogio Bear, NP      PDMP not reviewed this encounter.   Eulogio Bear, NP 04/30/22 1331

## 2022-04-30 NOTE — ED Triage Notes (Signed)
Pt reports cough, sore throat, nasal congestion x 4 days; bilateral ear pain x 1 days. vomited today. Ibuprofen gives some relief.

## 2022-05-03 LAB — CULTURE, GROUP A STREP (THRC)

## 2022-05-20 ENCOUNTER — Ambulatory Visit
Admission: RE | Admit: 2022-05-20 | Discharge: 2022-05-20 | Disposition: A | Discharge status: Home / Self Care | Payer: BC Managed Care – PPO | Source: Ambulatory Visit | Attending: Nurse Practitioner | Admitting: Nurse Practitioner

## 2022-05-20 ENCOUNTER — Ambulatory Visit (INDEPENDENT_AMBULATORY_CARE_PROVIDER_SITE_OTHER): Payer: BC Managed Care – PPO

## 2022-05-20 VITALS — BP 137/101 | HR 118 | Temp 98.7°F | Resp 18

## 2022-05-20 DIAGNOSIS — Z1152 Encounter for screening for COVID-19: Secondary | ICD-10-CM | POA: Diagnosis not present

## 2022-05-20 DIAGNOSIS — R059 Cough, unspecified: Secondary | ICD-10-CM | POA: Insufficient documentation

## 2022-05-20 DIAGNOSIS — J45901 Unspecified asthma with (acute) exacerbation: Secondary | ICD-10-CM | POA: Insufficient documentation

## 2022-05-20 LAB — RESP PANEL BY RT-PCR (FLU A&B, COVID) ARPGX2
Influenza A by PCR: NEGATIVE
Influenza B by PCR: NEGATIVE
SARS Coronavirus 2 by RT PCR: NEGATIVE

## 2022-05-20 MED ORDER — ALBUTEROL SULFATE (2.5 MG/3ML) 0.083% IN NEBU
2.5000 mg | INHALATION_SOLUTION | Freq: Once | RESPIRATORY_TRACT | Status: AC
  Administered 2022-05-20: 2.5 mg via RESPIRATORY_TRACT

## 2022-05-20 MED ORDER — METHYLPREDNISOLONE SODIUM SUCC 125 MG IJ SOLR
80.0000 mg | Freq: Once | INTRAMUSCULAR | Status: AC
  Administered 2022-05-20: 80 mg via INTRAMUSCULAR

## 2022-05-20 MED ORDER — PREDNISONE 20 MG PO TABS: 40.0000 mg | ORAL_TABLET | Freq: Every day | ORAL | 0 refills | Status: AC

## 2022-05-20 MED ORDER — DOXYCYCLINE HYCLATE 100 MG PO TABS: 100.0000 mg | ORAL_TABLET | Freq: Two times a day (BID) | ORAL | 0 refills | Status: AC

## 2022-05-20 MED ORDER — BENZONATATE 100 MG PO CAPS: 100.0000 mg | ORAL_CAPSULE | Freq: Three times a day (TID) | ORAL | 0 refills | Status: AC | PRN

## 2022-05-20 MED ORDER — DOXYCYCLINE MONOHYDRATE 100 MG PO CAPS: 100.0000 mg | ORAL_CAPSULE | Freq: Two times a day (BID) | ORAL | 0 refills | Status: AC

## 2022-05-20 MED ORDER — AMOXICILLIN-POT CLAVULANATE 875-125 MG PO TABS: 1.0000 | ORAL_TABLET | Freq: Two times a day (BID) | ORAL | 0 refills | Status: AC

## 2022-05-20 NOTE — Discharge Instructions (Addendum)
COVID/flu test is pending.  You will be contacted if the pending test results are positive.  As discussed, you are a candidate to receive molnupiravir as an antiviral therapy. Take medication as prescribed. Increase fluids and allow for plenty of rest. Recommend Tylenol or ibuprofen as needed for pain, fever, or general discomfort. Warm salt water gargles 3-4 times daily to help with throat pain or discomfort. Recommend using a humidifier at bedtime during sleep to help with cough and nasal congestion. Sleep elevated on 2 pillows while cough symptoms persist. If you develop worsening cough with wheezing, worsening shortness of breath, difficulty breathing, or become unable to speak in a complete sentence, please go to the emergency department immediately. Recommend following up with your primary care physician for reevaluation within the next 7 to 10 days. Follow-up as needed.

## 2022-05-20 NOTE — ED Provider Notes (Signed)
[facility]    CSN: [id number] Arrival date & time: 05/20/22  1101      History   Chief Complaint Chief Complaint  Patient presents with   Cough    At least two weeks. Throwing up. Pain in chest and when I lay down. - Entered by patient   Appointment    Agency    HPI [name] is a 41 y.o. female.   The history is provided by the patient.   Patient presents with a 3-week history of cough, wheezing, shortness of breath, and difficulty breathing.  Patient states that she was seen in this clinic on 04/30/2022 and treated for acute pharyngitis.  Patient states she was prescribed amoxicillin, but took the medication for 1 day after her throat culture results were negative.  She states since that time, her cough has worsened.  She states that she is coughing more and wheezing with lying down, and states that she is coughing to the point where she is "throwing up".  She also reports that the muscles in her abdomen are sore from coughing and she has tenderness in her back and rib cage.  Patient reports that her mother-in-law tested positive for COVID 1 day ago, she reports she is a close contact.  Patient reports that she has been taking over-the-counter cough and cold medications with minimal relief.  Patient did test negative for COVID and flu on 04/30/2022.  Patient also reports a history of asthma.  She reports since her symptoms started, she has been using her inhaler, only if her symptoms are severe.  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   Dysmenorrhea 09/12/2020   Vaginal irritation 09/12/2020   Encounter for cervical Pap smear with pelvic exam 09/12/2020   Sore throat 04/04/2020   Right non-suppurative otitis media 04/04/2020   Cough in adult 04/04/2020   High risk medication use 03/27/2020   S/P ORIF (open reduction internal fixation) fracture right ankle 02/16/2019 05/06/2019   Closed fracture of right ankle    Hypothyroid 11/04/2017   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   Postoperative abdominal pain 02/18/2017   Generalized anxiety disorder 12/29/2016   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   Fibroids 08/14/2015   Menorrhagia with regular cycle 07/26/2015   Sleep disorder 07/26/2015   Postpartum depression 05/15/2015   Anxiety 02/01/2015   Breech presentation, single footling 12/27/2014   Hypertension affecting pregnancy, antepartum 12/27/2014   Gestational diabetes mellitus, antepartum 12/27/2014   S/P cesarean section 12/27/2014   Unicornuate uterus affecting pregnancy in third trimester, antepartum 11/04/2014   Short cervix affecting pregnancy 11/04/2014   Hx of preterm delivery, currently pregnant 11/04/2014   ASCUS pap  07/04/2014   Supervision of high-risk pregnancy 06/29/2014   Uterine fibroid during pregnancy, antepartum 06/29/2014   Previous preterm delivery, antepartum 06/29/2014   AMA (advanced maternal age) multigravida 35+ 05/31/2014   Asthma    Hypertension  Past Surgical History:  Procedure Laterality Date   CAST APPLICATION Right 08/23/1827   Procedure: CAST APPLICATION;  Surgeon: Carole Civil, MD;  Location: [facility];  Service: Orthopedics;  Laterality: Right;   CESAREAN SECTION N/A 12/27/2014   Procedure: CESAREAN SECTION;  Surgeon: Osborne Oman, MD;  Location: [facility];  Service: Obstetrics;  Laterality: N/A;   CHOLECYSTECTOMY     DILATION AND CURETTAGE OF UTERUS     HYSTEROSCOPY N/A  01/31/2017   Procedure: HYSTEROSCOPY WITH HYDROTHERMAL ABLATION;  Surgeon: Jonnie Kind, MD;  Location: [facility];  Service: Gynecology;  Laterality: N/A;   LAPAROSCOPIC BILATERAL SALPINGECTOMY Bilateral 01/31/2017   Procedure: LAPAROSCOPIC BILATERAL SALPINGECTOMY WITH REMOVAL OF RIGHT OPARATUBAL CYST;  Surgeon: Jonnie Kind, MD;  Location: [facility];  Service: Gynecology;  Laterality: Bilateral;   LITHOTRIPSY     ORIF ANKLE FRACTURE Right 02/16/2019   Procedure: OPEN REDUCTION INTERNAL FIXATION (ORIF) ANKLE FRACTURE;  Surgeon: Carole Civil, MD;  Location: [facility];  Service: Orthopedics;  Laterality: Right;    OB History     Gravida  3   Para  2   Term  1   Preterm  1   AB  1   Living  2      SAB  1   IAB      Ectopic      Multiple  0   Live Births  2            Home Medications    Prior to Admission medications   Medication Sig Start Date End Date Taking? Authorizing Provider  amoxicillin-clavulanate (AUGMENTIN) 875-125 MG tablet Take 1 tablet by mouth every 12 (twelve) hours for 5 days. 05/20/22 05/25/22 Yes Shakiera Edelson-Warren, Alda Lea, NP  benzonatate (TESSALON PERLES) 100 MG capsule Take 1 capsule (100 mg total) by mouth 3 (three) times daily as needed for up to 10 days for cough. 05/20/22 05/30/22 Yes Lynde Ludwig-Warren, Alda Lea, NP  doxycycline (MONODOX) 100 MG capsule Take 1 capsule (100 mg total) by mouth 2 (two) times daily for 7 days. 05/20/22 05/27/22 Yes Keli Buehner-Warren, Alda Lea, NP  doxycycline (VIBRA-TABS) 100 MG tablet Take 1 tablet (100 mg total) by mouth 2 (two) times daily for 5 days. 05/20/22 05/25/22 Yes Azyiah Bo-Warren, Alda Lea, NP  predniSONE (DELTASONE) 20 MG tablet Take 2 tablets (40 mg total) by mouth daily with breakfast for 5 days. 05/20/22 05/25/22 Yes Nayellie Sanseverino-Warren, Alda Lea, NP  albuterol (PROVENTIL HFA;VENTOLIN HFA) 108 (90 Base) MCG/ACT inhaler Inhale 1-2 puffs into the lungs every 6 (six) hours as needed for wheezing or shortness of breath.  05/18/18   Cheyenne Adas, NP  ALPRAZolam Duanne Moron) 1 MG tablet Take 1 tablet (1 mg total) by mouth at bedtime as needed. 12/13/21   Kathyrn Drown, MD  ibuprofen (ADVIL) 200 MG tablet Take 200 mg by mouth every 6 (six) hours as needed.    [provider]  levothyroxine (SYNTHROID) 50 MCG tablet Take 1 tablet (50 mcg total) by mouth daily before breakfast. 12/13/21   Kathyrn Drown, MD  lisinopril (ZESTRIL) 2.5 MG tablet Take one tablet po daily 12/13/21   Kathyrn Drown, MD  promethazine-dextromethorphan (PROMETHAZINE-DM) 6.25-15 MG/5ML syrup Take 5 mLs by mouth at bedtime as needed for cough. Do not take with alcohol or while driving or operating heavy machinery.  May cause drowsiness. 04/30/22   Eulogio Bear, NP    Family History Family History  Problem Relation Age of Onset   Diabetes  Paternal Grandfather    Cancer Paternal Grandfather        lung   Diabetes Paternal Grandmother    Diabetes Maternal Grandmother    Diabetes Maternal Grandfather    Hypothyroidism Mother    Cancer Mother    Cancer Paternal Aunt     Social History Social History   Tobacco Use   Smoking status: Never   Smokeless tobacco: Never  Vaping Use   Vaping Use: Never used  Substance Use Topics   Alcohol use: Yes    Comment: glass of wine occ   Drug use: No     Allergies   Biaxin [clarithromycin], Cefzil [cefprozil], Ciprofloxacin, Hydrocodone, Morphine and related, Other, and Sulfa antibiotics   Review of Systems Review of Systems Per HPI  Physical Exam Triage Vital Signs ED Triage Vitals  Enc Vitals Group     BP 05/20/22 1142 (!) 137/101     Pulse Rate 05/20/22 1142 (!) 101     Resp 05/20/22 1142 18     Temp 05/20/22 1142 98.7 F (37.1 C)     Temp Source 05/20/22 1142 Oral     SpO2 05/20/22 1142 95 %     Weight --      Height --      Head Circumference --      Peak Flow --      Pain Score 05/20/22 1140 8     Pain Loc --      Pain Edu? --      Excl. in [place]? --     No data found.  Updated Vital Signs BP (!) 137/101 (BP Location: Right Arm)   Pulse (!) 118   Temp 98.7 F (37.1 C) (Oral)   Resp 18   LMP  (Within Months) Comment: 1 month  SpO2 98%   Visual Acuity Right Eye Distance:   Left Eye Distance:   Bilateral Distance:    Right Eye Near:   Left Eye Near:    Bilateral Near:     Physical Exam Vitals and nursing note reviewed.  Constitutional:      General: She is not in acute distress.    Appearance: Normal appearance.  HENT:     Head: Normocephalic.     Right Ear: Tympanic membrane, ear canal and external ear normal.     Left Ear: Tympanic membrane, ear canal and external ear normal.     Nose: Nose normal.     Mouth/Throat:     Mouth: Mucous membranes are moist.     Pharynx: Posterior oropharyngeal erythema present.  Eyes:     Extraocular Movements: Extraocular movements intact.     Pupils: Pupils are equal, round, and reactive to light.  Cardiovascular:     Rate and Rhythm: Normal rate and regular rhythm.     Pulses: Normal pulses.     Heart sounds: Normal heart sounds.  Pulmonary:     Effort: Pulmonary effort is normal. No respiratory distress.     Breath sounds: No stridor. Examination of the right-lower field reveals wheezing. Examination of the left-lower field reveals wheezing. Wheezing present. No rhonchi or rales.  Abdominal:     General: Bowel sounds are normal.     Palpations: Abdomen is soft.  Musculoskeletal:     Cervical back: Normal range of motion.  Lymphadenopathy:     Cervical: No cervical adenopathy.  Skin:    General: Skin is warm and dry.  Neurological:     General: No focal deficit present.  Mental Status: She is alert and oriented to person, place, and time.  Psychiatric:        Mood and Affect: Mood normal.        Behavior: Behavior normal.      UC Treatments / Results  Labs (all labs ordered are listed, but only abnormal results are displayed) Labs Reviewed  RESP PANEL BY RT-PCR  (FLU A&B, COVID) [id number]    EKG   Radiology DG Chest 2 View  Result Date: 05/20/2022 CLINICAL DATA:  Cough x3 weeks EXAM: CHEST - 2 VIEW COMPARISON:  08/16/2021 FINDINGS: Cardiac size is within normal limits. There is no focal pulmonary consolidation. There are no signs of alveolar pulmonary edema. There is slight prominence of interstitial markings in parahilar regions. There is no pleural effusion or pneumothorax. IMPRESSION: There is no focal pulmonary consolidation. There is no pleural effusion or pneumothorax. There is slight prominence of interstitial markings in the parahilar regions which may be a technical artifact or suggest early interstitial pneumonia. Electronically Signed   By: Elmer Picker M.D.   On: 05/20/2022 12:24    Procedures Procedures (including critical care time)  Medications Ordered in UC Medications  methylPREDNISolone sodium succinate (SOLU-MEDROL) 125 mg/2 mL injection 80 mg (80 mg Intramuscular Given 05/20/22 1225)  albuterol (PROVENTIL) (2.5 MG/3ML) 0.083% nebulizer solution 2.5 mg (2.5 mg Nebulization Given 05/20/22 1225)    Initial Impression / Assessment and Plan / UC Course  I have reviewed the triage vital signs and the nursing notes.  Pertinent labs & imaging results that were available during my care of the patient were reviewed by me and considered in my medical decision making (see chart for details).  Patient presents for complaints of cough that is been present for the last several weeks.  Patient's vital signs show that she is tachycardic, and hypertensive.  She is in no acute distress.  On exam, she has rhonchi in the posterior bilateral lower lung fields.  Nebulizer treatment was administered, with improvement of her lung sounds.  Patient was also administered Solu-Medrol 80 mg to help with bronchial inflammation.  Chest x-ray was performed which could not rule out early interstitial pneumonia.  Patient was started on Augmentin 875/125 and  doxycycline 100 mg.  Patient was also prescribed prednisone 40 mg.  Supportive care recommendations were provided to the patient to include Tylenol or ibuprofen for pain or fever, use of humidifier, and sleeping elevated.  COVID/flu test is also pending based on her recent exposure.  Patient was given strict indications of when to go to the emergency department.  Patient was advised to follow-up with her primary care physician within the next 7 to 10 days for reevaluation.  Patient verbalizes understanding.  All questions were answered.  Patient is stable for discharge. Final Clinical Impressions(s) / UC Diagnoses   Final diagnoses:  Cough, unspecified type  Encounter for screening for COVID-19  Asthma with acute exacerbation, unspecified asthma severity, unspecified whether persistent     Discharge Instructions      COVID/flu test is pending.  You will be contacted if the pending test results are positive.  As discussed, you are a candidate to receive molnupiravir as an antiviral therapy. Take medication as prescribed. Increase fluids and allow for plenty of rest. Recommend Tylenol or ibuprofen as needed for pain, fever, or general discomfort. Warm salt water gargles 3-4 times daily to help with throat pain or discomfort. Recommend using a humidifier at bedtime during sleep to help with cough and  nasal congestion. Sleep elevated on 2 pillows while cough symptoms persist. If you develop worsening cough with wheezing, worsening shortness of breath, difficulty breathing, or become unable to speak in a complete sentence, please go to the emergency department immediately. Recommend following up with your primary care physician for reevaluation within the next 7 to 10 days. Follow-up as needed.      ED Prescriptions     Medication Sig Dispense Auth. Provider   doxycycline (MONODOX) 100 MG capsule Take 1 capsule (100 mg total) by mouth 2 (two) times daily for 7 days. 14 capsule Amily Depp-Warren,  Alda Lea, NP   predniSONE (DELTASONE) 20 MG tablet Take 2 tablets (40 mg total) by mouth daily with breakfast for 5 days. 10 tablet Kenric Ginger-Warren, Alda Lea, NP   benzonatate (TESSALON PERLES) 100 MG capsule Take 1 capsule (100 mg total) by mouth 3 (three) times daily as needed for up to 10 days for cough. 30 capsule Myrikal Messmer-Warren, Alda Lea, NP   amoxicillin-clavulanate (AUGMENTIN) 875-125 MG tablet Take 1 tablet by mouth every 12 (twelve) hours for 5 days. 10 tablet Demarqus Jocson-Warren, Alda Lea, NP   doxycycline (VIBRA-TABS) 100 MG tablet Take 1 tablet (100 mg total) by mouth 2 (two) times daily for 5 days. 10 tablet Sandi Towe-Warren, Alda Lea, NP      PDMP not reviewed this encounter.   Tish Men, NP 05/20/22 1305

## 2022-05-20 NOTE — ED Triage Notes (Signed)
Pt reports cough x 4 weeks. OTC meds gives no relief; wheezing when lay down since 04/30/22. Pt reports rib cage pain, back pain, tingling sensation when coughing.  Mother in law tested positive for COVID yesterday. OTC meds gives no relief.,   Pt had negative COVID and Flu on 04/30/22.

## 2022-06-05 ENCOUNTER — Ambulatory Visit: Payer: BC Managed Care – PPO | Admitting: Family Medicine

## 2022-06-26 ENCOUNTER — Other Ambulatory Visit: Payer: Self-pay | Admitting: Family Medicine

## 2022-06-26 DIAGNOSIS — G479 Sleep disorder, unspecified: Secondary | ICD-10-CM

## 2022-07-01 ENCOUNTER — Ambulatory Visit: Payer: BC Managed Care – PPO | Admitting: Family Medicine

## 2022-07-24 ENCOUNTER — Other Ambulatory Visit: Payer: Self-pay | Admitting: Family Medicine

## 2022-07-24 DIAGNOSIS — G479 Sleep disorder, unspecified: Secondary | ICD-10-CM

## 2022-07-25 ENCOUNTER — Other Ambulatory Visit: Payer: Self-pay | Admitting: Family Medicine

## 2022-07-25 DIAGNOSIS — G479 Sleep disorder, unspecified: Secondary | ICD-10-CM

## 2022-07-25 MED ORDER — ALPRAZOLAM 1 MG PO TABS: 1.0000 mg | ORAL_TABLET | Freq: Every evening | ORAL | 0 refills | Status: DC | PRN

## 2022-07-29 ENCOUNTER — Ambulatory Visit: Payer: BC Managed Care – PPO | Admitting: Family Medicine

## 2022-08-14 ENCOUNTER — Ambulatory Visit: Payer: BC Managed Care – PPO | Admitting: Family Medicine

## 2022-08-15 ENCOUNTER — Encounter: Payer: Self-pay | Admitting: Radiology

## 2022-08-21 ENCOUNTER — Encounter: Payer: Self-pay | Admitting: Family Medicine

## 2022-08-21 ENCOUNTER — Ambulatory Visit: Payer: BC Managed Care – PPO | Admitting: Family Medicine

## 2022-08-21 VITALS — BP 122/82 | Wt 151.4 lb

## 2022-08-21 DIAGNOSIS — I10 Essential (primary) hypertension: Secondary | ICD-10-CM | POA: Diagnosis not present

## 2022-08-21 DIAGNOSIS — G479 Sleep disorder, unspecified: Secondary | ICD-10-CM

## 2022-08-21 DIAGNOSIS — E039 Hypothyroidism, unspecified: Secondary | ICD-10-CM | POA: Diagnosis not present

## 2022-08-21 DIAGNOSIS — E785 Hyperlipidemia, unspecified: Secondary | ICD-10-CM | POA: Diagnosis not present

## 2022-08-21 MED ORDER — LEVOTHYROXINE SODIUM 50 MCG PO TABS: 50.0000 ug | ORAL_TABLET | Freq: Every day | ORAL | 1 refills | Status: DC

## 2022-08-21 MED ORDER — LISINOPRIL 2.5 MG PO TABS: ORAL_TABLET | ORAL | 1 refills | Status: AC

## 2022-08-21 MED ORDER — ALPRAZOLAM 1 MG PO TABS: 1.0000 mg | ORAL_TABLET | Freq: Every evening | ORAL | 5 refills | Status: DC | PRN

## 2022-08-21 NOTE — Progress Notes (Signed)
   Subjective:    Patient ID: [name], female    DOB: 1980/10/02, 42 y.o.   MRN: [id number]  HPI Patient arrives today for 6 month medication follow up. Patient presents for follow-up Overall she has been doing well Trying to eat healthy and stay active Fitting in some regular exercise with walking She does utilize alprazolam at nighttime not every single night but most nights to help her sleep She does take her thyroid medicine on a regular basis She also takes her blood pressure medicine regular basis tries to minimize salt She is due for lab work   Review of Systems     Objective:   Physical Exam  General-in no acute distress Eyes-no discharge Lungs-respiratory rate normal, CTA CV-no murmurs,RRR Extremities skin warm dry no edema Neuro grossly normal Behavior normal, alert       Assessment & Plan:  1. Primary hypertension Blood pressure good on recheck.  Healthy diet recommended.  Regular physical activity.  Patient encouraged to follow-up within 6 months - Basic Metabolic Panel (7) - Microalbumin/Creatinine Ratio, Urine  2. Hypothyroidism, unspecified type Continue thyroid medicine check lab work await results. - levothyroxine (SYNTHROID) 50 MCG tablet; Take 1 tablet (50 mcg total) by mouth daily before breakfast.  Dispense: 90 tablet; Refill: 1  3. Sleep disorder Alprazolam at night caution drowsiness. - ALPRAZolam (XANAX) 1 MG tablet; Take 1 tablet (1 mg total) by mouth at bedtime as needed.  Dispense: 30 tablet; Refill: 5  4. Hyperlipidemia, unspecified hyperlipidemia type Hyperlipidemia check lipid profile. - Lipid panel Follow-up 6 months

## 2022-12-11 ENCOUNTER — Other Ambulatory Visit: Payer: Self-pay

## 2022-12-11 DIAGNOSIS — E785 Hyperlipidemia, unspecified: Secondary | ICD-10-CM

## 2022-12-11 DIAGNOSIS — E039 Hypothyroidism, unspecified: Secondary | ICD-10-CM

## 2022-12-11 DIAGNOSIS — I10 Essential (primary) hypertension: Secondary | ICD-10-CM

## 2022-12-11 NOTE — Progress Notes (Signed)
Labs added added in EPIC per Dr Roby Lofts request, please redo her labs to add TSH to the labs that were ordered earlier today thank you est.

## 2023-01-28 ENCOUNTER — Other Ambulatory Visit: Payer: Self-pay | Admitting: Family Medicine

## 2023-01-28 DIAGNOSIS — G479 Sleep disorder, unspecified: Secondary | ICD-10-CM

## 2023-02-20 ENCOUNTER — Ambulatory Visit: Payer: BC Managed Care – PPO | Admitting: Family Medicine

## 2023-03-26 ENCOUNTER — Ambulatory Visit: Payer: BC Managed Care – PPO | Admitting: Nurse Practitioner

## 2023-03-26 VITALS — BP 124/86 | HR 85 | Temp 98.4°F | Ht 59.0 in | Wt 150.2 lb

## 2023-03-26 DIAGNOSIS — G479 Sleep disorder, unspecified: Secondary | ICD-10-CM | POA: Diagnosis not present

## 2023-03-26 DIAGNOSIS — E039 Hypothyroidism, unspecified: Secondary | ICD-10-CM

## 2023-03-26 DIAGNOSIS — Z1159 Encounter for screening for other viral diseases: Secondary | ICD-10-CM

## 2023-03-26 DIAGNOSIS — Z130 Encounter for screening for diseases of the blood and blood-forming organs and certain disorders involving the immune mechanism: Secondary | ICD-10-CM | POA: Diagnosis not present

## 2023-03-26 DIAGNOSIS — I10 Essential (primary) hypertension: Secondary | ICD-10-CM | POA: Diagnosis not present

## 2023-03-26 MED ORDER — ALPRAZOLAM 1 MG PO TABS: 1.0000 mg | ORAL_TABLET | Freq: Every evening | ORAL | 5 refills | Status: DC | PRN

## 2023-03-26 MED ORDER — TRAZODONE HCL 50 MG PO TABS: 25.0000 mg | ORAL_TABLET | Freq: Every evening | ORAL | 0 refills | Status: DC | PRN

## 2023-03-26 NOTE — Progress Notes (Unsigned)
Subjective:    Patient ID: [name], female    DOB: 1980-12-16, 42 y.o.   MRN: [id number]  HPI Presents for routine follow-up on her blood pressure and sleep issues.  Is doing well on her reduced dose of lisinopril 2.5 mg.  Denies any dizzy spells or syncope.  Has been walking 1 mile every evening which has helped her physically and mentally.  States she needs to decrease the salt in her diet.  Drinks mainly water.  Has been taking her thyroid and BP med early in the morning to help adherence.  Continues to have issues with anxiety and trouble sleeping at bedtime.  Has been trying different methods such as calming noises and reading to help relax her.  Taking Xanax only as needed, not every day.  Gets regular preventive health physicals and Pap smears with gynecology.   Review of Systems  HENT:  Negative for sore throat and trouble swallowing.   Respiratory:  Negative for cough, chest tightness and shortness of breath.   Cardiovascular:  Negative for chest pain and leg swelling.  Psychiatric/Behavioral:  Positive for sleep disturbance.    Today's Vitals   03/26/23 1522  BP: 124/86  Pulse: 85  Temp: 98.4 F (36.9 C)  SpO2: 97%  Weight: 150 lb 3.2 oz (68.1 kg)  Height: 4\' 11"  (1.499 m)   Body mass index is 30.34 kg/m.    03/26/2023    3:26 PM  Depression screen PHQ 2/9  Decreased Interest 0  Down, Depressed, Hopeless 0  PHQ - 2 Score 0  Altered sleeping 3  Tired, decreased energy 1  Change in appetite 1  Feeling bad or failure about yourself  0  Trouble concentrating 0  Moving slowly or fidgety/restless 0  Suicidal thoughts 0  PHQ-9 Score 5  Difficult doing work/chores Not difficult at all      03/26/2023    3:27 PM 08/21/2022    2:41 PM 03/27/2020    3:22 PM 05/25/2019    9:17 AM  GAD 7 : Generalized Anxiety Score  Nervous, Anxious, on Edge 1 1 0 0  Control/stop worrying 0 0 0 0  Worry too much - different things 0 0 0 1  Trouble relaxing 2 2 0 1  Restless  0 1 0 0  Easily annoyed or irritable 1 0 0 0  Afraid - awful might happen 0 0 0 0  Total GAD 7 Score 4 4 0 2  Anxiety Difficulty Not difficult at all Not difficult at all Not difficult at all Not difficult at all         Objective:   Physical Exam NAD.  Alert, oriented.  Thyroid nontender to palpation, no mass or goiter noted.  Lungs clear.  Heart regular rate rhythm.  Carotids no bruits or thrills.  Lower extremities no edema. Today's Vitals   03/26/23 1522  BP: 124/86  Pulse: 85  Temp: 98.4 F (36.9 C)  SpO2: 97%  Weight: 150 lb 3.2 oz (68.1 kg)  Height: 4\' 11"  (1.499 m)   Body mass index is 30.34 kg/m.       Assessment & Plan:   Problem List Items Addressed This Visit       Cardiovascular and Mediastinum   Hypertension - Primary   Relevant Orders   Comprehensive metabolic panel (Completed)   Lipid panel (Completed)   Microalbumin/Creatinine Ratio, Urine (Completed)     Endocrine   Hypothyroid   Relevant Orders   TSH (Completed)  Other   Sleep disorder   Relevant Medications   ALPRAZolam (XANAX) 1 MG tablet   Other Visit Diagnoses     Screening for deficiency anemia       Relevant Orders   CBC with Differential/Platelet (Completed)   Encounter for hepatitis C screening test for low risk patient       Relevant Orders   Hepatitis C antibody (Completed)      Routine labs ordered including hep C screening. Discussed options for her sleep.  Trial of low-dose trazodone at bedtime.  Discontinue medication and contact office if any adverse effects.  Continue to use alprazolam on a as needed basis. Return in about 6 months (around 09/24/2023). Call back sooner if needed.

## 2023-03-26 NOTE — Patient Instructions (Signed)
Black cohosh Soy Flaxseed [employer] or [email or web address]

## 2023-03-27 ENCOUNTER — Encounter: Payer: Self-pay | Admitting: Nurse Practitioner

## 2023-03-27 LAB — CBC WITH DIFFERENTIAL/PLATELET
Basophils Absolute: 0.1 10*3/uL (ref 0.0–0.2)
Basos: 2 %
EOS (ABSOLUTE): 0.2 10*3/uL (ref 0.0–0.4)
Eos: 3 %
Hematocrit: 43.7 % (ref 34.0–46.6)
Hemoglobin: 14.1 g/dL (ref 11.1–15.9)
Immature Grans (Abs): 0 10*3/uL (ref 0.0–0.1)
Immature Granulocytes: 0 %
Lymphocytes Absolute: 2 10*3/uL (ref 0.7–3.1)
Lymphs: 39 %
MCH: 30.2 pg (ref 26.6–33.0)
MCHC: 32.3 g/dL (ref 31.5–35.7)
MCV: 94 fL (ref 79–97)
Monocytes Absolute: 0.7 10*3/uL (ref 0.1–0.9)
Monocytes: 13 %
Neutrophils Absolute: 2.3 10*3/uL (ref 1.4–7.0)
Neutrophils: 43 %
Platelets: 424 10*3/uL (ref 150–450)
RBC: 4.67 x10E6/uL (ref 3.77–5.28)
RDW: 12.3 % (ref 11.7–15.4)
WBC: 5.2 10*3/uL (ref 3.4–10.8)

## 2023-03-27 LAB — HEPATITIS C ANTIBODY: Hep C Virus Ab: NONREACTIVE

## 2023-03-27 LAB — LIPID PANEL
Chol/HDL Ratio: 5 {ratio} — ABNORMAL HIGH (ref 0.0–4.4)
Cholesterol, Total: 228 mg/dL — ABNORMAL HIGH (ref 100–199)
HDL: 46 mg/dL (ref >39)
LDL Chol Calc (NIH): 148 mg/dL — ABNORMAL HIGH (ref 0–99)
Triglycerides: 187 mg/dL — ABNORMAL HIGH (ref 0–149)
VLDL Cholesterol Cal: 34 mg/dL (ref 5–40)

## 2023-03-27 LAB — COMPREHENSIVE METABOLIC PANEL
ALT: 19 [IU]/L (ref 0–32)
AST: 13 [IU]/L (ref 0–40)
Albumin: 4.7 g/dL (ref 3.9–4.9)
Alkaline Phosphatase: 77 [IU]/L (ref 44–121)
BUN/Creatinine Ratio: 20 (ref 9–23)
BUN: 16 mg/dL (ref 6–24)
Bilirubin Total: <0.2 mg/dL (ref 0.0–1.2)
CO2: 23 mmol/L (ref 20–29)
Calcium: 9.6 mg/dL (ref 8.7–10.2)
Chloride: 101 mmol/L (ref 96–106)
Creatinine, Ser: 0.8 mg/dL (ref 0.57–1.00)
Globulin, Total: 2.6 g/dL (ref 1.5–4.5)
Glucose: 92 mg/dL (ref 70–99)
Potassium: 4.2 mmol/L (ref 3.5–5.2)
Sodium: 138 mmol/L (ref 134–144)
Total Protein: 7.3 g/dL (ref 6.0–8.5)
eGFR: 94 mL/min/{1.73_m2} (ref >59)

## 2023-03-27 LAB — MICROALBUMIN / CREATININE URINE RATIO
Creatinine, Urine: 69.6 mg/dL
Microalb/Creat Ratio: <4 mg/g{creat} (ref 0–29)
Microalbumin, Urine: <3 ug/mL

## 2023-03-27 LAB — TSH: TSH: 8.72 u[IU]/mL — ABNORMAL HIGH (ref 0.450–4.500)

## 2023-03-29 ENCOUNTER — Other Ambulatory Visit (INDEPENDENT_AMBULATORY_CARE_PROVIDER_SITE_OTHER): Payer: BC Managed Care – PPO | Admitting: Nurse Practitioner

## 2023-03-29 DIAGNOSIS — E039 Hypothyroidism, unspecified: Secondary | ICD-10-CM

## 2023-03-29 MED ORDER — LEVOTHYROXINE SODIUM 75 MCG PO TABS: 75.0000 ug | ORAL_TABLET | Freq: Every day | ORAL | 0 refills | Status: DC

## 2023-04-17 ENCOUNTER — Encounter: Payer: Self-pay | Admitting: Nurse Practitioner

## 2023-04-17 ENCOUNTER — Other Ambulatory Visit: Payer: Self-pay | Admitting: Family Medicine

## 2023-04-17 MED ORDER — TRAZODONE HCL 50 MG PO TABS: 25.0000 mg | ORAL_TABLET | Freq: Every evening | ORAL | 3 refills | Status: DC | PRN

## 2023-07-29 ENCOUNTER — Telehealth: Payer: Self-pay | Admitting: *Deleted

## 2023-07-29 NOTE — Telephone Encounter (Unsigned)
Copied from CRM [id number]. Topic: Clinical - Medical Advice >> Jul 29, 2023 10:00 AM [name] wrote: Reason for CRM: Patient tested positive for Covid. Patient took a test at [facility] and they prescribed her promethazine-dextromethorphan for her cough and wants to know if she should take paxlovid. Patient is requesting a callback at [phone]

## 2023-07-29 NOTE — Telephone Encounter (Signed)
Left message to return call to notify patient of provider's recommendations below

## 2023-07-29 NOTE — Telephone Encounter (Signed)
Nurses Please speak with patient Paxlovid can lessen the risk of being hospitalized may or may not necessarily reduces symptomatology This is because the virus has mutated so many times that the Paxlovid is not quite as effective This is something that we can send then if she would like to take this with her underlying reactive airway issues it would be reasonable if she did take it she would have to stop trazodone while on this Please find out from patient if she wants to take it or just symptomatically treat herself at home  Obviously if she starts having progressive symptoms worsening issues she ought to be seen

## 2023-08-01 NOTE — Telephone Encounter (Signed)
Patient notified and stated she is currently on the Paxlovid and felling better.

## 2023-08-08 ENCOUNTER — Other Ambulatory Visit: Payer: Self-pay | Admitting: Family Medicine

## 2023-09-03 ENCOUNTER — Other Ambulatory Visit: Payer: Self-pay | Admitting: Nurse Practitioner

## 2023-09-03 DIAGNOSIS — G479 Sleep disorder, unspecified: Secondary | ICD-10-CM

## 2023-09-19 ENCOUNTER — Other Ambulatory Visit: Payer: Self-pay | Admitting: Nurse Practitioner

## 2023-09-19 MED ORDER — LEVOTHYROXINE SODIUM 75 MCG PO TABS: 75.0000 ug | ORAL_TABLET | Freq: Every day | ORAL | 0 refills | Status: DC

## 2023-09-29 ENCOUNTER — Other Ambulatory Visit: Payer: Self-pay | Admitting: Nurse Practitioner

## 2023-09-29 DIAGNOSIS — G479 Sleep disorder, unspecified: Secondary | ICD-10-CM

## 2023-10-14 ENCOUNTER — Ambulatory Visit: Admitting: Nurse Practitioner

## 2023-10-14 VITALS — BP 143/80 | HR 95 | Temp 98.4°F | Ht 59.0 in | Wt 155.0 lb

## 2023-10-14 DIAGNOSIS — E039 Hypothyroidism, unspecified: Secondary | ICD-10-CM | POA: Diagnosis not present

## 2023-10-14 DIAGNOSIS — F419 Anxiety disorder, unspecified: Secondary | ICD-10-CM | POA: Diagnosis not present

## 2023-10-14 DIAGNOSIS — G479 Sleep disorder, unspecified: Secondary | ICD-10-CM

## 2023-10-14 MED ORDER — ALPRAZOLAM 1 MG PO TABS: ORAL_TABLET | ORAL | 0 refills | Status: DC

## 2023-10-14 NOTE — Patient Instructions (Signed)
Black cohosh Soy Flaxseed  [place]  

## 2023-10-14 NOTE — Progress Notes (Signed)
 Subjective:    Patient ID: [name], female    DOB: 28-Apr-1981, 43 y.o.   MRN: [id number]  HPI Presents for recheck on sleep disturbance.  Currently taking a half of a trazodone  50 mg and a half alprazolam  1 mg at bedtime which is working very well.  Currently very busy with work in her family.  Plans to cut back or stop medication this summer when she is out of work.  Drinks a large amount of water .  Walking on a regular basis.  Trying to stay healthy.  Rarely takes half of a Xanax  during the day if needed for extreme anxiety.  Adherent to medication regimen including levothyroxine  75 mcg daily.   Review of Systems  HENT:  Negative for sore throat and trouble swallowing.   Respiratory:  Negative for cough, chest tightness, shortness of breath and wheezing.   Cardiovascular:  Negative for chest pain.  Psychiatric/Behavioral:  Positive for sleep disturbance.        10/14/2023    2:09 PM  Depression screen PHQ 2/9  Decreased Interest 0  Down, Depressed, Hopeless 1  PHQ - 2 Score 1  Altered sleeping 2  Tired, decreased energy 1  Change in appetite 3  Feeling bad or failure about yourself  0  Trouble concentrating 0  Moving slowly or fidgety/restless 0  Suicidal thoughts 0  PHQ-9 Score 7  Difficult doing work/chores Not difficult at all      10/14/2023    2:10 PM 03/26/2023    3:27 PM 08/21/2022    2:41 PM 03/27/2020    3:22 PM  GAD 7 : Generalized Anxiety Score  Nervous, Anxious, on Edge 2 1 1  0  Control/stop worrying 1 0 0 0  Worry too much - different things 1 0 0 0  Trouble relaxing 1 2 2  0  Restless 0 0 1 0  Easily annoyed or irritable 0 1 0 0  Afraid - awful might happen 0 0 0 0  Total GAD 7 Score 5 4 4  0  Anxiety Difficulty Not difficult at all Not difficult at all Not difficult at all Not difficult at all    Social History   Tobacco Use   Smoking status: Never   Smokeless tobacco: Never  Vaping Use   Vaping status: Never Used  Substance Use Topics    Alcohol use: Yes    Comment: glass of wine occ   Drug use: No       Objective:   Physical Exam NAD.  Alert, oriented.  Calm cheerful affect.  Smiling.  Thyroid nontender to palpation, no mass or goiter noted.  Lungs clear.  Heart regular rate rhythm. Today's Vitals   10/14/23 1404  BP: (!) 143/80  Pulse: 95  Temp: 98.4 F (36.9 C)  SpO2: 99%  Weight: 155 lb (70.3 kg)  Height: 4\' 11"  (1.499 m)   Body mass index is 31.31 kg/m.        Assessment & Plan:   Problem List Items Addressed This Visit       Endocrine   Hypothyroid   Relevant Orders   TSH     Other   Anxiety - Primary   Relevant Medications   ALPRAZolam  (XANAX ) 1 MG tablet   Sleep disorder   Relevant Medications   ALPRAZolam  (XANAX ) 1 MG tablet   Meds ordered this encounter  Medications   ALPRAZolam  (XANAX ) 1 MG tablet    Sig: TAKE 1/2 - 1 TABLET BY MOUTH AT  BEDTIME AS NEEDED    Dispense:  30 tablet    Refill:  0   Continue current regimen of trazodone  and alprazolam  as directed. Repeat TSH ordered. Encourage continued healthy habits. Follow-up early fall for recheck if needed for continuance of medications.

## 2023-11-08 ENCOUNTER — Other Ambulatory Visit: Payer: Self-pay | Admitting: Nurse Practitioner

## 2023-11-08 DIAGNOSIS — G479 Sleep disorder, unspecified: Secondary | ICD-10-CM

## 2023-11-10 ENCOUNTER — Encounter: Payer: Self-pay | Admitting: Nurse Practitioner

## 2023-11-11 ENCOUNTER — Other Ambulatory Visit: Payer: Self-pay | Admitting: Nurse Practitioner

## 2023-11-11 DIAGNOSIS — G479 Sleep disorder, unspecified: Secondary | ICD-10-CM

## 2023-12-04 ENCOUNTER — Other Ambulatory Visit: Payer: Self-pay | Admitting: Nurse Practitioner

## 2023-12-04 DIAGNOSIS — G479 Sleep disorder, unspecified: Secondary | ICD-10-CM

## 2023-12-20 ENCOUNTER — Other Ambulatory Visit: Payer: Self-pay | Admitting: Family Medicine

## 2023-12-25 ENCOUNTER — Other Ambulatory Visit: Payer: Self-pay | Admitting: Nurse Practitioner

## 2023-12-26 ENCOUNTER — Other Ambulatory Visit: Payer: Self-pay | Admitting: Nurse Practitioner

## 2023-12-26 ENCOUNTER — Ambulatory Visit: Payer: Self-pay | Admitting: Nurse Practitioner

## 2023-12-26 ENCOUNTER — Encounter: Payer: Self-pay | Admitting: Nurse Practitioner

## 2023-12-26 DIAGNOSIS — E039 Hypothyroidism, unspecified: Secondary | ICD-10-CM

## 2023-12-26 LAB — TSH: TSH: 4.98 u[IU]/mL — ABNORMAL HIGH (ref 0.450–4.500)

## 2023-12-26 MED ORDER — LEVOTHYROXINE SODIUM 100 MCG PO TABS
100.0000 ug | ORAL_TABLET | Freq: Every day | ORAL | 0 refills | Status: DC
Start: 2023-12-26 — End: 2024-03-22

## 2024-01-28 ENCOUNTER — Ambulatory Visit
Admission: RE | Admit: 2024-01-28 | Discharge: 2024-01-28 | Disposition: A | Discharge status: Home / Self Care | Attending: Emergency Medicine

## 2024-01-28 VITALS — BP 129/84 | HR 101 | Temp 98.0°F | Resp 18

## 2024-01-28 DIAGNOSIS — J0100 Acute maxillary sinusitis, unspecified: Secondary | ICD-10-CM | POA: Diagnosis not present

## 2024-01-28 MED ORDER — AMOXICILLIN-POT CLAVULANATE 875-125 MG PO TABS: 1.0000 | ORAL_TABLET | Freq: Two times a day (BID) | ORAL | 0 refills | Status: AC

## 2024-01-28 NOTE — ED Triage Notes (Signed)
 Patient to Urgent Care with complaints of sinus pain and pressure/ nasal congestion/ cough/ headaches. Denies any fevers.  Symptoms x3 days.  Taking dayquil/ nyquil

## 2024-01-28 NOTE — Discharge Instructions (Addendum)
 Continue symptomatic care as discussed.  If your symptoms are not improving, take the Augmentin  as directed.  Follow-up with your primary care provider if your symptoms are not improving.

## 2024-01-28 NOTE — ED Provider Notes (Signed)
 [facility]    CSN: [id number] Arrival date & time: 01/28/24  9070      History   Chief Complaint Chief Complaint  Patient presents with   Nasal Congestion    Sinus, cough, face pain, and drainage. - Entered by patient    HPI [name] is a 43 y.o. female.  Patient presents with 3-day history of nasal congestion, sinus pressure, runny nose, cough.  No fever or shortness of breath.  She has been treating her symptoms with DayQuil and NyQuil.  The history is provided by the patient and medical records.    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   Dysmenorrhea 09/12/2020   Vaginal irritation 09/12/2020   Encounter for cervical Pap smear with pelvic exam 09/12/2020   Sore throat 04/04/2020   Right non-suppurative otitis media 04/04/2020   Cough in adult 04/04/2020   High risk medication use 03/27/2020   S/P ORIF (open reduction internal fixation) fracture right ankle 02/16/2019 05/06/2019   Closed fracture of right ankle    Hypothyroid 11/04/2017   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   Postoperative abdominal pain 02/18/2017   Generalized anxiety disorder 12/29/2016   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   Fibroids 08/14/2015   Menorrhagia with regular cycle 07/26/2015   Sleep disorder 07/26/2015   Postpartum depression 05/15/2015   Anxiety 02/01/2015   Breech presentation, single footling 12/27/2014   Hypertension affecting pregnancy,  antepartum 12/27/2014   Gestational diabetes mellitus, antepartum 12/27/2014   S/P cesarean section 12/27/2014   Unicornuate uterus affecting pregnancy in third trimester, antepartum 11/04/2014   Short cervix affecting pregnancy 11/04/2014   Hx of preterm delivery, currently pregnant 11/04/2014   ASCUS pap  07/04/2014   Supervision of high-risk pregnancy 06/29/2014   Uterine fibroid during pregnancy, antepartum 06/29/2014   Previous preterm delivery, antepartum 06/29/2014   AMA (advanced maternal age) multigravida 35+ 05/31/2014   Asthma    Hypertension     Past Surgical History:  Procedure Laterality Date   CAST APPLICATION Right 02/16/2019   Procedure: CAST APPLICATION;  Surgeon: Margrette Taft BRAVO, MD;  Location: [facility];  Service: Orthopedics;  Laterality: Right;   CESAREAN SECTION N/A 12/27/2014   Procedure: CESAREAN SECTION;  Surgeon: Gloris DELENA Hugger, MD;  Location: [facility];  Service: Obstetrics;  Laterality: N/A;   CHOLECYSTECTOMY     DILATION AND CURETTAGE OF UTERUS     HYSTEROSCOPY N/A 01/31/2017   Procedure: HYSTEROSCOPY WITH HYDROTHERMAL ABLATION;  Surgeon: Edsel Norleen GAILS, MD;  Location: [facility];  Service: Gynecology;  Laterality: N/A;   LAPAROSCOPIC BILATERAL SALPINGECTOMY Bilateral 01/31/2017   Procedure: LAPAROSCOPIC BILATERAL SALPINGECTOMY WITH REMOVAL OF RIGHT OPARATUBAL CYST;  Surgeon: Edsel Norleen GAILS, MD;  Location: [facility];  Service: Gynecology;  Laterality: Bilateral;   LITHOTRIPSY     ORIF ANKLE FRACTURE Right 02/16/2019   Procedure: OPEN REDUCTION INTERNAL FIXATION (ORIF) ANKLE FRACTURE;  Surgeon: Margrette Taft BRAVO, MD;  Location: [facility];  Service: Orthopedics;  Laterality: Right;    OB History     Gravida  3   Para  2   Term  1   Preterm  1   AB  1   Living  2      SAB  1   IAB      Ectopic      Multiple  0   Live Births  2            Home Medications    Prior to Admission medications   Medication Sig Start Date End Date Taking?  Authorizing Provider  amoxicillin -clavulanate (AUGMENTIN ) 875-125 MG tablet Take 1 tablet by mouth every 12 (twelve) hours. 01/28/24  Yes Corlis Burnard DEL, NP  albuterol  (PROVENTIL  HFA;VENTOLIN  HFA) 108 (90 Base) MCG/ACT inhaler Inhale 1-2 puffs into the lungs every 6 (six) hours as needed for wheezing or shortness of breath. 05/18/18   Chrystal Shaver, NP  ALPRAZolam  (XANAX ) 1 MG tablet TAKE 1/2 TO 1 (ONE-HALF TO ONE) TABLET BY MOUTH AT BEDTIME AS NEEDED 12/05/23   Alphonsa Glendia LABOR, MD  levothyroxine  (SYNTHROID ) 100 MCG tablet Take 1 tablet (100 mcg total) by mouth daily. 12/26/23   Hoskins, Carolyn C, NP  lisinopril  (ZESTRIL ) 2.5 MG tablet Take one tablet po daily 08/21/22   Alphonsa Glendia LABOR, MD  promethazine -dextromethorphan (PROMETHAZINE -DM) 6.25-15 MG/5ML syrup Take 5 mLs by mouth at bedtime as needed for cough. Do not take with alcohol or while driving or operating heavy machinery.  May cause drowsiness. Patient not taking: Reported on 01/28/2024 04/30/22   Chandra Harlene LABOR, NP  traZODone  (DESYREL ) 50 MG tablet TAKE 1/2 TO 1 (ONE-HALF TO ONE) TABLET BY MOUTH AT BEDTIME AS NEEDED FOR SLEEP 12/22/23   Alphonsa Glendia LABOR, MD    Family History Family History  Problem Relation Age of Onset   Diabetes Paternal Grandfather    Cancer Paternal Grandfather        lung   Diabetes Paternal Grandmother    Diabetes Maternal Grandmother    Diabetes Maternal Grandfather    Hypothyroidism Mother    Cancer Mother    Cancer Paternal Aunt     Social History Social History   Tobacco Use   Smoking status: Never   Smokeless tobacco: Never  Vaping Use   Vaping status: Never Used  Substance Use Topics   Alcohol use: Yes    Comment: glass of wine occ   Drug use: No     Allergies   Biaxin [clarithromycin], Cefzil [cefprozil], Ciprofloxacin, Hydrocodone , Morphine  and codeine , Other, and Sulfa antibiotics   Review of Systems Review of Systems  Constitutional:  Negative for chills and fever.  HENT:   Positive for congestion, postnasal drip and rhinorrhea. Negative for ear pain and sore throat.   Respiratory:  Positive for cough. Negative for shortness of breath.      Physical Exam Triage Vital Signs ED Triage Vitals [01/28/24 0944]  Encounter Vitals Group     BP 129/84     Girls Systolic BP Percentile      Girls Diastolic BP Percentile      Boys Systolic BP Percentile      Boys Diastolic BP Percentile      Pulse Rate (!) 101     Resp 18     Temp 98 F (36.7 C)     Temp src      SpO2 96 %     Weight      Height  Head Circumference      Peak Flow      Pain Score      Pain Loc      Pain Education      Exclude from Growth Chart    No data found.  Updated Vital Signs BP 129/84   Pulse (!) 101   Temp 98 F (36.7 C)   Resp 18   LMP 12/15/2023   SpO2 96%   Visual Acuity Right Eye Distance:   Left Eye Distance:   Bilateral Distance:    Right Eye Near:   Left Eye Near:    Bilateral Near:     Physical Exam Constitutional:      General: She is not in acute distress. HENT:     Right Ear: Tympanic membrane normal.     Left Ear: Tympanic membrane normal.     Nose: Congestion and rhinorrhea present.     Mouth/Throat:     Mouth: Mucous membranes are moist.     Pharynx: Oropharynx is clear.  Cardiovascular:     Rate and Rhythm: Normal rate and regular rhythm.     Heart sounds: Normal heart sounds.  Pulmonary:     Effort: Pulmonary effort is normal. No respiratory distress.     Breath sounds: Normal breath sounds.  Neurological:     Mental Status: She is alert.      UC Treatments / Results  Labs (all labs ordered are listed, but only abnormal results are displayed) Labs Reviewed - No data to display  EKG   Radiology No results found.  Procedures Procedures (including critical care time)  Medications Ordered in UC Medications - No data to display  Initial Impression / Assessment and Plan / UC Course  I have reviewed the triage vital signs  and the nursing notes.  Pertinent labs & imaging results that were available during my care of the patient were reviewed by me and considered in my medical decision making (see chart for details).    Acute sinusitis.  Patient has been symptomatic for 3 days.  Discussed continued symptomatic care.  If not improving in 2 to 3 days, start Augmentin .  Education provided on sinus infection.  Instructed patient to follow up with her PCP if her symptoms are not improving.  She agrees to plan of care.   Final Clinical Impressions(s) / UC Diagnoses   Final diagnoses:  Acute non-recurrent maxillary sinusitis     Discharge Instructions      Continue symptomatic care as discussed.  If your symptoms are not improving, take the Augmentin  as directed.  Follow-up with your primary care provider if your symptoms are not improving.      ED Prescriptions     Medication Sig Dispense Auth. Provider   amoxicillin -clavulanate (AUGMENTIN ) 875-125 MG tablet Take 1 tablet by mouth every 12 (twelve) hours. 14 tablet Corlis Burnard DEL, NP      PDMP not reviewed this encounter.   Corlis Burnard DEL, NP 01/28/24 1031

## 2024-02-05 ENCOUNTER — Encounter: Payer: Self-pay | Admitting: Family Medicine

## 2024-02-05 NOTE — Telephone Encounter (Signed)
 Message was placed in patient's chart to be addressed by provider

## 2024-02-18 ENCOUNTER — Encounter: Payer: Self-pay | Admitting: Family Medicine

## 2024-02-24 ENCOUNTER — Other Ambulatory Visit: Payer: Self-pay | Admitting: Family Medicine

## 2024-02-24 ENCOUNTER — Telehealth: Payer: Self-pay | Admitting: Family Medicine

## 2024-02-24 DIAGNOSIS — G479 Sleep disorder, unspecified: Secondary | ICD-10-CM

## 2024-02-24 NOTE — Telephone Encounter (Signed)
 Nurses I sent in refills of her Xanax  as requested Please send her a MyChart message that she needs to do a follow-up visit with myself or Elveria within the next 90 days She is on controlled meds that require office visit every 6 months thank you-Dr. Glendia

## 2024-03-21 ENCOUNTER — Other Ambulatory Visit: Payer: Self-pay | Admitting: Nurse Practitioner

## 2024-06-15 ENCOUNTER — Other Ambulatory Visit: Payer: Self-pay | Admitting: Family Medicine

## 2024-06-15 DIAGNOSIS — G479 Sleep disorder, unspecified: Secondary | ICD-10-CM

## 2024-06-19 ENCOUNTER — Other Ambulatory Visit: Payer: Self-pay | Admitting: Nurse Practitioner

## 2024-07-19 ENCOUNTER — Other Ambulatory Visit: Payer: Self-pay | Admitting: Family Medicine

## 2024-08-12 ENCOUNTER — Encounter: Admitting: Advanced Practice Midwife
